# Patient Record
Sex: Female | Born: 1965
Health system: Southern US, Community
[De-identification: ages and names within clinical notes are randomized; demographics above are authoritative.]

## PROBLEM LIST (undated history)

## (undated) DIAGNOSIS — T7840XA Allergy, unspecified, initial encounter: Secondary | ICD-10-CM

## (undated) DIAGNOSIS — N750 Cyst of Bartholin's gland: Secondary | ICD-10-CM

## (undated) DIAGNOSIS — F32A Depression, unspecified: Secondary | ICD-10-CM

## (undated) DIAGNOSIS — F329 Major depressive disorder, single episode, unspecified: Secondary | ICD-10-CM

## (undated) DIAGNOSIS — F419 Anxiety disorder, unspecified: Secondary | ICD-10-CM

## (undated) DIAGNOSIS — E785 Hyperlipidemia, unspecified: Secondary | ICD-10-CM

## (undated) HISTORY — DX: Hyperlipidemia, unspecified: E78.5

## (undated) HISTORY — DX: Anxiety disorder, unspecified: F41.9

## (undated) HISTORY — PX: CATARACT EXTRACTION: SUR2

## (undated) HISTORY — PX: CHOLECYSTECTOMY: SHX55

## (undated) HISTORY — DX: Allergy, unspecified, initial encounter: T78.40XA

---

## 1997-08-06 HISTORY — PX: CHOLECYSTECTOMY: SHX55

## 1997-11-17 ENCOUNTER — Emergency Department (HOSPITAL_COMMUNITY): Admission: EM | Admit: 1997-11-17 | Discharge: 1997-11-17 | Payer: Self-pay | Admitting: Emergency Medicine

## 1998-02-23 ENCOUNTER — Observation Stay (HOSPITAL_COMMUNITY): Admission: RE | Admit: 1998-02-23 | Discharge: 1998-02-24 | Payer: Self-pay | Admitting: General Surgery

## 1998-08-15 ENCOUNTER — Other Ambulatory Visit: Admission: RE | Admit: 1998-08-15 | Discharge: 1998-08-15 | Payer: Self-pay | Admitting: *Deleted

## 1999-08-17 ENCOUNTER — Other Ambulatory Visit: Admission: RE | Admit: 1999-08-17 | Discharge: 1999-08-17 | Payer: Self-pay | Admitting: *Deleted

## 2000-08-19 ENCOUNTER — Other Ambulatory Visit: Admission: RE | Admit: 2000-08-19 | Discharge: 2000-08-19 | Payer: Self-pay | Admitting: *Deleted

## 2001-02-10 ENCOUNTER — Other Ambulatory Visit: Admission: RE | Admit: 2001-02-10 | Discharge: 2001-02-10 | Payer: Self-pay | Admitting: *Deleted

## 2001-09-18 ENCOUNTER — Other Ambulatory Visit: Admission: RE | Admit: 2001-09-18 | Discharge: 2001-09-18 | Payer: Self-pay | Admitting: Obstetrics and Gynecology

## 2002-09-23 ENCOUNTER — Other Ambulatory Visit: Admission: RE | Admit: 2002-09-23 | Discharge: 2002-09-23 | Payer: Self-pay | Admitting: *Deleted

## 2003-10-14 ENCOUNTER — Other Ambulatory Visit: Admission: RE | Admit: 2003-10-14 | Discharge: 2003-10-14 | Payer: Self-pay | Admitting: *Deleted

## 2003-10-19 ENCOUNTER — Encounter: Admission: RE | Admit: 2003-10-19 | Discharge: 2003-10-19 | Payer: Self-pay | Admitting: Internal Medicine

## 2005-01-15 ENCOUNTER — Other Ambulatory Visit: Admission: RE | Admit: 2005-01-15 | Discharge: 2005-01-15 | Payer: Self-pay | Admitting: Obstetrics and Gynecology

## 2006-07-17 ENCOUNTER — Ambulatory Visit (HOSPITAL_COMMUNITY): Admission: RE | Admit: 2006-07-17 | Discharge: 2006-07-17 | Payer: Self-pay | Admitting: Obstetrics and Gynecology

## 2006-08-06 DIAGNOSIS — N750 Cyst of Bartholin's gland: Secondary | ICD-10-CM

## 2006-08-06 HISTORY — DX: Cyst of Bartholin's gland: N75.0

## 2006-11-19 ENCOUNTER — Encounter: Admission: RE | Admit: 2006-11-19 | Discharge: 2006-11-19 | Payer: Self-pay | Admitting: Internal Medicine

## 2007-07-21 ENCOUNTER — Emergency Department (HOSPITAL_COMMUNITY): Admission: EM | Admit: 2007-07-21 | Discharge: 2007-07-21 | Payer: Self-pay | Admitting: Family Medicine

## 2009-05-03 ENCOUNTER — Ambulatory Visit (HOSPITAL_COMMUNITY): Admission: RE | Admit: 2009-05-03 | Discharge: 2009-05-03 | Payer: Self-pay | Admitting: Obstetrics and Gynecology

## 2010-05-04 ENCOUNTER — Ambulatory Visit (HOSPITAL_COMMUNITY): Admission: RE | Admit: 2010-05-04 | Discharge: 2010-05-04 | Payer: Self-pay | Admitting: Obstetrics and Gynecology

## 2010-12-22 NOTE — H&P (Signed)
Brandi Molina, Brandi Molina              ACCOUNT NO.:  [id number]   MEDICAL RECORD NO.:  [id number]          PATIENT TYPE:  AMB   LOCATION:  SDC                           FACILITY:  [facility]   PHYSICIAN:  Guy Sandifer. Henderson Cloud, M.D. DATE OF BIRTH:  13-Aug-1965   DATE OF ADMISSION:  07/17/2006  DATE OF DISCHARGE:                              HISTORY & PHYSICAL   CHIEF COMPLAINT:  Lost IUD.   HISTORY OF PRESENT ILLNESS:  This patient is a 45 year old married white  female G2, P1with a Mirena IUD placed approximately 5 years ago.  The  string has been lost for most of that time.  Ultrasound approximately 3  years ago revealed intrauterine placement of the IUD.  However, it is  now time to have it replaced.  The patient is being admitted for  hysteroscopy D&C to remove the IUD and probable replacement of a Mirena  IUD.  Potential risks and complications have been discussed  preoperatively.   PAST MEDICAL HISTORY:  1. Osteopenia.  2. History of depression.  3. Hyperlipidemia.   PAST SURGICAL HISTORY:  Negative.   MEDICATIONS:  Zoloft and Lipitor.   ALLERGIES:  NO KNOWN DRUG ALLERGIES.   SOCIAL HISTORY:  The patient smokes a pack a day, denies ethanol or drug  abuse.   FAMILY HISTORY:  Noncontributory.   REVIEW OF SYSTEMS:  NEURO:  Denies headache.  CARDIOLOGY:  No chest  pain.  PULMONARY:  Denies shortness of breath.   PHYSICAL EXAMINATION:  VITAL SIGNS:  Height 5 feet 0 inches, weight 115  pounds, blood pressure 98/66.  HEENT:  Without thyromegaly.  LUNGS:  Clear to auscultation.  HEART:  Regular rate and rhythm.  BACK:  Without CVA tenderness.  BREASTS:  Without mass, retraction, or discharge.  ABDOMEN:  Soft, nontender, without masses.  PELVIC EXAM:  Vulva, vagina, cervix without lesion.  There is a 1-2 cm  smooth, mobile, nontender left Bartholin cyst.  Uterus normal size,  mobile, nontender.  IUD string not visible.  Adnexa nontender without  masses.  EXTREMITIES/NEUROLOGICAL:   Exam grossly within normal limits.   ASSESSMENT:  Lost intrauterine device string.   PLAN:  Hysteroscopy D&C, removal and replacement of Mirena IUD.      Guy Sandifer Henderson Cloud, M.D.  Electronically Signed     JET/MEDQ  D:  07/15/2006  T:  07/15/2006  Job:  [id number]

## 2010-12-22 NOTE — Op Note (Signed)
NAMEJAYLANI, MCGUINN              ACCOUNT NO.:  [id number]   MEDICAL RECORD NO.:  [id number]          PATIENT TYPE:  AMB   LOCATION:  SDC                           FACILITY:  [facility]   PHYSICIAN:  Guy Sandifer. Henderson Cloud, M.D. DATE OF BIRTH:  Aug 02, 1966   DATE OF PROCEDURE:  07/17/2006  DATE OF DISCHARGE:                               OPERATIVE REPORT   PREOPERATIVE DIAGNOSIS:  Lost IUD string.   POSTOPERATIVE DIAGNOSIS:  Lost IUD string.   PROCEDURE:  Removal and replacement of Marina IUD and 1% Xylocaine  paracervical block.   SURGEON:  Harold Hedge, MD   ANESTHESIA:  MAC.   SPECIMENS:  None.   BLOOD LOSS:  Minimal.   INDICATIONS AND CONSENT:  This patient is a 45 year old married white  female G2, P1 with Jearld Adjutant IUD in place.  The IUD string has been lost.  Ultrasound reveals it to be intrauterine in location.  It is time for  replacement.  The patient requests the IUD be replaced with another  [facility].  Potential risks and complications have been discussed  preoperatively including but not limited to infection, uterine  perforation, organ damage, bleeding requiring transfusion of blood  products with possible transfusion reaction, HIV and hepatitis  acquisition, DVT and PE.  All questions were answered and consent signed  on the chart.   PROCEDURE:  The patient taken to operating room where she was  identified, placed dorsosupine position and given intravenous sedation.  She is then placed in dorsal lithotomy position where she was prepped,  bladder straight catheterized and she is draped in sterile fashion.  Bivalve speculum was placed in vagina.  Anterior cervical lip was  injected with 1% Xylocaine and grasped with single-tooth tenaculum.  Paracervical block was placed at the 2, 4, 5, 7, 8 and 10 o'clock  positions with approximately 20 mL total of 1% plain Xylocaine.  Cervix  was gently progressively dilated to a 19 dilator.  Randall stone forceps  were then placed in the  intrauterine cavity and the IUD was easily  extracted.  The uterus was sounded and a Jearld Adjutant IUD was then replaced  per manufacturer's instructions without difficulty.  String is trimmed.  All counts were correct.  The patient is awakened and taken to recovery  room in stable condition.      Guy Sandifer Henderson Cloud, M.D.  Electronically Signed     JET/MEDQ  D:  07/17/2006  T:  07/17/2006  Job:  [id number]

## 2011-05-30 ENCOUNTER — Other Ambulatory Visit (HOSPITAL_COMMUNITY): Payer: Self-pay | Admitting: Obstetrics and Gynecology

## 2011-05-30 DIAGNOSIS — Z1231 Encounter for screening mammogram for malignant neoplasm of breast: Secondary | ICD-10-CM

## 2011-06-22 ENCOUNTER — Ambulatory Visit (HOSPITAL_COMMUNITY): Payer: Self-pay

## 2011-06-25 ENCOUNTER — Ambulatory Visit (HOSPITAL_COMMUNITY)
Admission: RE | Admit: 2011-06-25 | Discharge: 2011-06-25 | Disposition: A | Discharge status: Home / Self Care | Payer: Self-pay | Source: Ambulatory Visit | Attending: Obstetrics and Gynecology | Admitting: Obstetrics and Gynecology

## 2011-06-25 DIAGNOSIS — Z1231 Encounter for screening mammogram for malignant neoplasm of breast: Secondary | ICD-10-CM

## 2011-09-19 ENCOUNTER — Encounter (HOSPITAL_COMMUNITY): Payer: Self-pay | Admitting: *Deleted

## 2011-09-19 ENCOUNTER — Emergency Department (HOSPITAL_COMMUNITY)
Admission: EM | Admit: 2011-09-19 | Discharge: 2011-09-19 | Disposition: A | Discharge status: Home / Self Care | Payer: Self-pay | Source: Home / Self Care | Attending: Family Medicine | Admitting: Family Medicine

## 2011-09-19 DIAGNOSIS — N750 Cyst of Bartholin's gland: Secondary | ICD-10-CM

## 2011-09-19 MED ORDER — OXYCODONE-ACETAMINOPHEN 5-325 MG PO TABS: ORAL_TABLET | ORAL | Status: AC

## 2011-09-19 MED ORDER — SULFAMETHOXAZOLE-TRIMETHOPRIM 800-160 MG PO TABS: 1.0000 | ORAL_TABLET | Freq: Two times a day (BID) | ORAL | Status: AC

## 2011-09-19 NOTE — ED Notes (Signed)
pT  REPORTS  HISTORY  OF A  RECURRENT  CYST/ BOIL  IN  PERINEAL  AREA      -  SHE  STATES  Dr  Rudene Christians  Was   Going to  Remove  It in past  But  For  Some  Reason it did  Not happen   -  At  This time  Pt reports  Pain/ swelling  Swelling

## 2011-09-19 NOTE — Discharge Instructions (Signed)
This is a Bartholin's cyst vs. Abscess. It is inflamed and tender. It will need to be drained. Please present to [facility] for further evaluation and treatment. Please take medications as directed. You should also use warm compresses and Sitz baths for comfort. Return to care should your symptoms not improve, or worsen in any way.

## 2011-09-19 NOTE — ED Provider Notes (Signed)
History     CSN: [id number]  Arrival date & time 09/19/11  2536   First MD Initiated Contact with Patient 09/19/11 1909      Chief Complaint  Patient presents with  . Recurrent Skin Infections    (Consider location/radiation/quality/duration/timing/severity/associated sxs/prior treatment) HPI Comments: Brandi Molina presents for evaluation of a painful lesion on the left side of her vagina. She reports that this has been present for many years. He was previously evaluated by her previous gynecologist. He told her this would probably need to be removed at some point. However it has not bothered her until now. She reports painful swelling in the area. States that she had an IUD removed in December and has been having sexual intercourse with her husband, who has been using condoms. She wonders if the condoms or irritating the area now.  Patient is a 46 y.o. female presenting with female genitourinary complaint. The history is provided by the patient.  Female GU Problem Primary symptoms include genital lesions.  Primary symptoms include no discharge and no vaginal bleeding. There has been no fever. This is a new problem. The current episode started more than 1 week ago. The problem occurs constantly. The problem has not changed since onset.The symptoms occur at rest and during intercourse.    History reviewed. No pertinent past medical history.  Past Surgical History  Procedure Date  . Cholecystectomy     History reviewed. No pertinent family history.  History  Substance Use Topics  . Smoking status: Current Everyday Smoker  . Smokeless tobacco: Not on file  . Alcohol Use: No    OB History    Grav Para Term Preterm Abortions TAB SAB Ect Mult Living                  Review of Systems  Constitutional: Negative.   HENT: Negative.   Eyes: Negative.   Respiratory: Negative.   Cardiovascular: Negative.   Gastrointestinal: Negative.   Genitourinary: Positive for vaginal pain.  Negative for vaginal bleeding.  Musculoskeletal: Negative.   Skin: Negative.   Neurological: Negative.     Allergies  Review of patient's allergies indicates no known allergies.  Home Medications   Current Outpatient Rx  Name Route Sig Dispense Refill  . SERTRALINE HCL 50 MG PO TABS Oral Take 50 mg by mouth daily.    . OXYCODONE-ACETAMINOPHEN 5-325 MG PO TABS  Take one to two tablets every 4 to 6 hours as needed for pain 20 tablet 0  . SULFAMETHOXAZOLE-TRIMETHOPRIM 800-160 MG PO TABS Oral Take 1 tablet by mouth 2 (two) times daily. 14 tablet 0    BP 137/68  Pulse 90  Temp(Src) 98.2 F (36.8 C) (Oral)  Resp 18  SpO2 100%  LMP 09/12/2011  Physical Exam  Nursing note and vitals reviewed. Constitutional: She is oriented to person, place, and time. She appears well-developed and well-nourished.  HENT:  Head: Normocephalic and atraumatic.  Eyes: EOM are normal.  Neck: Normal range of motion.  Pulmonary/Chest: Effort normal.  Genitourinary: Vagina normal.    There is tenderness on the left labia.  Musculoskeletal: Normal range of motion.  Neurological: She is alert and oriented to person, place, and time.  Skin: Skin is warm and dry.  Psychiatric: Her behavior is normal.    ED Course  Procedures (including critical care time)  Labs Reviewed - No data to display No results found.   1. Bartholin's cyst       MDM  rx given for  hydrocodone PRN, TMP-SMX, and referred to [facility] tomorrow for follow up.         Richardo Priest, MD 09/19/11 2008

## 2011-09-22 ENCOUNTER — Inpatient Hospital Stay (HOSPITAL_COMMUNITY)
Admission: AD | Admit: 2011-09-22 | Discharge: 2011-09-22 | Disposition: A | Discharge status: Home / Self Care | Payer: Self-pay | Source: Ambulatory Visit | Attending: Obstetrics & Gynecology | Admitting: Obstetrics & Gynecology

## 2011-09-22 ENCOUNTER — Encounter (HOSPITAL_COMMUNITY): Payer: Self-pay | Admitting: *Deleted

## 2011-09-22 DIAGNOSIS — N750 Cyst of Bartholin's gland: Secondary | ICD-10-CM | POA: Insufficient documentation

## 2011-09-22 HISTORY — DX: Major depressive disorder, single episode, unspecified: F32.9

## 2011-09-22 HISTORY — DX: Cyst of Bartholin's gland: N75.0

## 2011-09-22 HISTORY — DX: Depression, unspecified: F32.A

## 2011-09-22 MED ORDER — LIDOCAINE 5 % EX OINT: TOPICAL_OINTMENT | Freq: Three times a day (TID) | CUTANEOUS | Status: AC | PRN

## 2011-09-22 MED ORDER — LIDOCAINE 5 % EX OINT
TOPICAL_OINTMENT | Freq: Three times a day (TID) | CUTANEOUS | Status: DC | PRN
  Administered 2011-09-22: 02:00:00 via TOPICAL
  Filled 2011-09-22: qty 35.44

## 2011-09-22 NOTE — ED Provider Notes (Signed)
Chief Complaint:  Cyst   Brandi Molina is  46 y.o. No obstetric history on file..  Patient's last menstrual period was 09/12/2011..  She presents complaining of Cyst  Reports diagnosis of bartholin cyst at [facility] on 09/19/11. Was rx'd septra and percocet. States only taking Ibuprofen. Using warm compresses daily. States she was instructed to return to [facility]U today for I&D.   Obstetrical/Gynecological History: OB History    Grav Para Term Preterm Abortions TAB SAB Ect Mult Living                  Past Medical History: No past medical history on file.  Past Surgical History: Past Surgical History  Procedure Date  . Cholecystectomy     Family History: No family history on file.  Social History: History  Substance Use Topics  . Smoking status: Current Everyday Smoker  . Smokeless tobacco: Not on file  . Alcohol Use: No    Allergies: No Known Allergies  Prescriptions prior to admission  Medication Sig Dispense Refill  . oxyCODONE-acetaminophen (PERCOCET) 5-325 MG per tablet Take one to two tablets every 4 to 6 hours as needed for pain  20 tablet  0  . sertraline (ZOLOFT) 50 MG tablet Take 50 mg by mouth daily.      Marland Kitchen sulfamethoxazole-trimethoprim (BACTRIM DS,SEPTRA DS) 800-160 MG per tablet Take 1 tablet by mouth 2 (two) times daily.  14 tablet  0    Review of Systems - Negative except what has been reviewed in HPI  Physical Exam   Blood pressure 128/57, pulse 99, temperature 98.3 F (36.8 C), temperature source Oral, resp. rate 20, height 5' (1.524 m), weight 128 lb 8 oz (58.287 kg), last menstrual period 09/12/2011.  General: General appearance - alert, well appearing, and in no distress, oriented to person, place, and time and normal appearing weight Mental status - alert, oriented to person, place, and time, normal mood, behavior, speech, dress, motor activity, and thought processes, affect appropriate to mood Focused Gynecological Exam: VULVA: vulvar mass left labia  with firm, tender, non fluccuant bartholin cyst, VAGINA: vaginal cyst as above. Not ready for I&D   Assessment: Bartholin Cyst: left  Plan: Discharge home Warm water soaks/compresses 3-4 times daily 5% Lidocaine ointment prn pain.  Return to [facility]U or PCP in 3 days for recheck, readiness for I&D  SHORES,SUZANNE E. 09/22/2011,1:34 AM   Attestation of Attending Supervision of Advanced Practitioner (CNM/NP): Evaluation and management procedures were performed by the Advanced Practitioner under my supervision and collaboration. I have reviewed the Advanced Practitioner's note and chart, and I agree with the management and plan.  Yerik Zeringue H. 11:01 AM

## 2011-09-22 NOTE — Discharge Instructions (Signed)
Bartholin's Cyst and Abscess Bartholin's glands produce mucus through small openings just outside the opening of the vagina. The mucus helps with lubrication around the vagina during sexual intercourse. If the duct becomes clogged, the gland will swell and cause a bulge on the inside of the vagina. If this becomes big enough, it can be seen and felt on the outside of the vagina as well. Sometimes, the swelling will shrink away by itself. However, if the cyst becomes infected, the Bartholin's cyst fills with pus and becomes more swollen, red and painful and becomes a Bartholin's abscess. This usually requires antibiotic treatment and surgical drainage. Sometimes, with minor surgery under local anesthesia, a small tube is placed in the cyst or abscess wall. This allows continued drainage for up to 6 weeks. Minor surgery can make a new opening to replace the clogged duct and help prevent future cysts or abscess. If the abscess occurs several times, a minor operation with local anesthesia is necessary to remove the Bartholin's gland completely or to make it drain better. Cutting open the gland and suturing the edges to make the opening of the gland bigger (marsupialization) may be needed and should usually be done by your obstetrician-gyncology physician. Antibiotics are usually prescribed for this condition. Take all antibiotics as prescribed. Make sure to finish them even if you are doing better. Take warm sitz baths for 20 minutes, 3 times a day. See your caregiver for follow-up care as recommended. SEEK MEDICAL CARE IF:   You have increasing pain, swelling, or redness near the vagina.   You have vomiting or inability to tolerate medicines.   You have a fever.   You have uncontrolled bleeding from the vagina.  Document Released: 08/30/2004 Document Revised: 04/04/2011 Document Reviewed: 09/02/2009 Exit[employer] Patient Information 2012 [employer]. 

## 2011-09-22 NOTE — Progress Notes (Signed)
Pt states, " I was seen at [facility] on Feb 13, and they started me on Septra and percocet. Dr. Marvetta Gibbons office couldn't see me and it is huge and side of my vagina."

## 2011-09-22 NOTE — ED Notes (Signed)
[name] CNM at bedside  

## 2011-09-24 ENCOUNTER — Encounter: Payer: Self-pay | Admitting: Physician Assistant

## 2011-09-24 ENCOUNTER — Ambulatory Visit (INDEPENDENT_AMBULATORY_CARE_PROVIDER_SITE_OTHER): Payer: Self-pay | Admitting: Physician Assistant

## 2011-09-24 VITALS — BP 119/65 | HR 114 | Temp 97.0°F | Ht 60.0 in | Wt 125.7 lb

## 2011-09-24 DIAGNOSIS — N750 Cyst of Bartholin's gland: Secondary | ICD-10-CM | POA: Insufficient documentation

## 2011-09-24 DIAGNOSIS — N751 Abscess of Bartholin's gland: Secondary | ICD-10-CM

## 2011-09-24 NOTE — Patient Instructions (Signed)
Bartholin's Cyst and Abscess Bartholin's glands produce mucus through small openings just outside the opening of the vagina. The mucus helps with lubrication around the vagina during sexual intercourse. If the duct becomes clogged, the gland will swell and cause a bulge on the inside of the vagina. If this becomes big enough, it can be seen and felt on the outside of the vagina as well. Sometimes, the swelling will shrink away by itself. However, if the cyst becomes infected, the Bartholin's cyst fills with pus and becomes more swollen, red and painful and becomes a Bartholin's abscess. This usually requires antibiotic treatment and surgical drainage. Sometimes, with minor surgery under local anesthesia, a small tube is placed in the cyst or abscess wall. This allows continued drainage for up to 6 weeks. Minor surgery can make a new opening to replace the clogged duct and help prevent future cysts or abscess. If the abscess occurs several times, a minor operation with local anesthesia is necessary to remove the Bartholin's gland completely or to make it drain better. Cutting open the gland and suturing the edges to make the opening of the gland bigger (marsupialization) may be needed and should usually be done by your obstetrician-gyncology physician. Antibiotics are usually prescribed for this condition. Take all antibiotics as prescribed. Make sure to finish them even if you are doing better. Take warm sitz baths for 20 minutes, 3 times a day. See your caregiver for follow-up care as recommended. SEEK MEDICAL CARE IF:   You have increasing pain, swelling, or redness near the vagina.   You have vomiting or inability to tolerate medicines.   You have a fever.   You have uncontrolled bleeding from the vagina.  Document Released: 08/30/2004 Document Revised: 04/04/2011 Document Reviewed: 09/02/2009 Exit[employer] Patient Information 2012 [employer]. 

## 2011-09-24 NOTE — Progress Notes (Signed)
Chief Complaint:  bartholen cyst   Brandi Molina is  46 y.o. G2P1011.  Patient's last menstrual period was 09/12/2011.Marland Kitchen  Her pregnancy status is negative.  She presents complaining of bartholen cyst  Pt presents for evaluation of Bartholin Cyst for I&D. Was seen in [facility]U early Saturday morning and was not ready for I&D. Has been taking Septra and doing baking soda soaks TID for comforting.  Obstetrical/Gynecological History: OB History    Grav Para Term Preterm Abortions TAB SAB Ect Mult Living   2 1 1  1 1    1       Past Medical History: Past Medical History  Diagnosis Date  . Bartholin gland cyst 2008  . Depression     Past Surgical History: Past Surgical History  Procedure Date  . Cholecystectomy   . Cholecystectomy 1999    Family History: Family History  Problem Relation Age of Onset  . Anesthesia problems Neg Hx   . Hypotension Neg Hx   . Malignant hyperthermia Neg Hx   . Pseudochol deficiency Neg Hx     Social History: History  Substance Use Topics  . Smoking status: Current Everyday Smoker -- 1.0 packs/day  . Smokeless tobacco: Not on file  . Alcohol Use: No    Allergies: No Known Allergies   (Not in a hospital admission)  Review of Systems - Negative except what has been reviewed in HPI  Physical Exam   Blood pressure 119/65, pulse 114, temperature 97 F (36.1 C), temperature source Oral, height 5' (1.524 m), weight 125 lb 11.2 oz (57.017 kg), last menstrual period 09/12/2011.  General: General appearance - alert, uncomfortable appearing, and in no distress, oriented to person, place, and time, normal appearing weight Mental status - alert, oriented to person, place, and time, normal mood, behavior, speech, dress, motor activity, and thought processes, affect appropriate to mood Focused Gynecological Exam: Large left sided bartholin abscess, golf ball sized, soft, tender  After informed consent and timeout area was cleansed with betadine and  infused with 2cc of 1% lidocaine. After adequate level achieved, 2mm incision made. Copious amount of purulent drainage expressed. Additional 2cc Lidocaine infused at incision site and marsupialization was perform in normal fashion with 2-0 vicryl. Pt tolerated procedure well.   Assessment: I&D of Bartholin Gland Abscess with Marsupialization  Plan: Continue soaks daily for drainage and comfort FU 4 weeks for recheck or prn probs  Brandi Molina. 09/24/2011,3:27 PM

## 2011-10-22 ENCOUNTER — Ambulatory Visit: Payer: Self-pay | Admitting: Physician Assistant

## 2011-11-09 ENCOUNTER — Ambulatory Visit: Payer: Self-pay | Admitting: Physician Assistant

## 2012-06-17 ENCOUNTER — Other Ambulatory Visit (HOSPITAL_COMMUNITY): Payer: Self-pay | Admitting: Obstetrics and Gynecology

## 2012-06-17 DIAGNOSIS — Z1231 Encounter for screening mammogram for malignant neoplasm of breast: Secondary | ICD-10-CM

## 2012-07-04 ENCOUNTER — Ambulatory Visit (HOSPITAL_COMMUNITY): Payer: Self-pay | Attending: Obstetrics and Gynecology

## 2013-10-02 ENCOUNTER — Emergency Department (INDEPENDENT_AMBULATORY_CARE_PROVIDER_SITE_OTHER): Payer: Self-pay

## 2013-10-02 ENCOUNTER — Encounter (HOSPITAL_COMMUNITY): Payer: Self-pay | Admitting: Emergency Medicine

## 2013-10-02 ENCOUNTER — Emergency Department (INDEPENDENT_AMBULATORY_CARE_PROVIDER_SITE_OTHER)
Admission: EM | Admit: 2013-10-02 | Discharge: 2013-10-02 | Disposition: A | Discharge status: Home / Self Care | Payer: Self-pay | Source: Home / Self Care | Attending: Family Medicine | Admitting: Family Medicine

## 2013-10-02 DIAGNOSIS — M545 Low back pain, unspecified: Secondary | ICD-10-CM

## 2013-10-02 DIAGNOSIS — W009XXA Unspecified fall due to ice and snow, initial encounter: Secondary | ICD-10-CM

## 2013-10-02 DIAGNOSIS — W010XXA Fall on same level from slipping, tripping and stumbling without subsequent striking against object, initial encounter: Secondary | ICD-10-CM

## 2013-10-02 MED ORDER — KETOROLAC TROMETHAMINE 60 MG/2ML IM SOLN
INTRAMUSCULAR | Status: AC
  Filled 2013-10-02: qty 2

## 2013-10-02 MED ORDER — HYDROCODONE-ACETAMINOPHEN 5-325 MG PO TABS: 1.0000 | ORAL_TABLET | Freq: Four times a day (QID) | ORAL | Status: DC | PRN

## 2013-10-02 MED ORDER — KETOROLAC TROMETHAMINE 60 MG/2ML IM SOLN
60.0000 mg | Freq: Once | INTRAMUSCULAR | Status: AC
  Administered 2013-10-02: 60 mg via INTRAMUSCULAR

## 2013-10-02 NOTE — ED Provider Notes (Signed)
Brandi Molina is a 48 y.o. female who presents to Urgent Care today for follow a low back pain. Patient slipped and fell landing on her left low back today. She landed on the ears. She notes severe left lateral low back pain without any radiation. She notes pain with leg motion. She denies any fevers or chills nausea vomiting or diarrhea. She denies any weakness or numbness or bowel bladder dysfunction.   Past Medical History  Diagnosis Date  . Bartholin gland cyst 2008  . Depression    History  Substance Use Topics  . Smoking status: Current Every Day Smoker -- 1.00 packs/day  . Smokeless tobacco: Not on file  . Alcohol Use: No   ROS as above Medications: No current facility-administered medications for this encounter.   Current Outpatient Prescriptions  Medication Sig Dispense Refill  . HYDROcodone-acetaminophen (NORCO/VICODIN) 5-325 MG per tablet Take 1 tablet by mouth every 6 (six) hours as needed.  15 tablet  0  . sertraline (ZOLOFT) 50 MG tablet Take 50 mg by mouth daily.        Exam:  BP 128/56  Pulse 74  Temp(Src) 97.5 F (36.4 C) (Oral)  Resp 14  SpO2 100%  LMP 09/20/2013 Gen: Well NAD Back: Tender and ecchymosis around the left SI joint. Mildly tender low lumbar spinal midline.  Range of motion significantly limited due to pain Leg strength intact.  Capillary refill and sensation are intact distally   No results found for this or any previous visit (from the past 24 hour(s)). Dg Lumbar Spine Complete  10/02/2013   CLINICAL DATA:  48 year old female status post fall down stairs with pain. Initial encounter.  EXAM: LUMBAR SPINE - COMPLETE 4+ VIEW  COMPARISON:  Pelvis radiograph from the same day reported separately.  FINDINGS: Standing views. Aortoiliac calcified atherosclerosis noted. Right upper quadrant surgical clips.  Normal lumbar segmentation. No pars fracture. Moderate lower lumbar facet hypertrophy. Mild grade 1 anterolisthesis of L4 on L5 (3 mm). Mild  retrolisthesis of L3 on L4. Relatively preserved disc spaces. No lumbar compression fracture identified. Sacral ala and SI joints grossly intact.  IMPRESSION: 1.  No acute fracture identified in the lumbar spine. 2. Mild degenerative appearing grade 1 spondylolisthesis at L4-L5 and L3-L4, associated with lower lumbar facet arthropathy. 3.  Aortoiliac calcified atherosclerosis.   Electronically Signed   By: Lars Pinks M.D.   On: 10/02/2013 10:04   Dg Pelvis 1-2 Views  10/02/2013   CLINICAL DATA:  Lower lumbar and pelvic pain secondary to a fall down stairs this morning.  EXAM: PELVIS - 1-2 VIEW  COMPARISON:  None.  FINDINGS: There is no evidence of pelvic fracture or diastasis or other abnormality.  IMPRESSION: Normal exam.   Electronically Signed   By: Rozetta Nunnery M.D.   On: 10/02/2013 09:47    Assessment and Plan: 48 y.o. female with pain due to fall and low back contusion. Plan to treat with IM Toradol followed by oral NSAIDs and Norco. Followup if not improving   Discussed warning signs or symptoms. Please see discharge instructions. Patient expresses understanding.    Gregor Hams, MD 10/02/13 1018

## 2013-10-02 NOTE — Discharge Instructions (Signed)
Thank you for coming in today. Take up to 2 Aleve twice daily starting this afternoon.  Use Norco for severe pain Come back or go to the emergency room if you notice new weakness new numbness problems walking or bowel or bladder problems.

## 2014-04-27 ENCOUNTER — Other Ambulatory Visit: Payer: Self-pay

## 2014-04-27 DIAGNOSIS — Z1231 Encounter for screening mammogram for malignant neoplasm of breast: Secondary | ICD-10-CM

## 2014-05-07 ENCOUNTER — Ambulatory Visit
Admission: RE | Admit: 2014-05-07 | Discharge: 2014-05-07 | Disposition: A | Discharge status: Home / Self Care | Payer: BC Managed Care – PPO | Source: Ambulatory Visit

## 2014-05-07 DIAGNOSIS — Z1231 Encounter for screening mammogram for malignant neoplasm of breast: Secondary | ICD-10-CM

## 2014-06-07 ENCOUNTER — Encounter (HOSPITAL_COMMUNITY): Payer: Self-pay | Admitting: Emergency Medicine

## 2014-07-12 ENCOUNTER — Other Ambulatory Visit: Payer: Self-pay | Admitting: Nurse Practitioner

## 2014-07-12 ENCOUNTER — Ambulatory Visit
Admission: RE | Admit: 2014-07-12 | Discharge: 2014-07-12 | Disposition: A | Discharge status: Home / Self Care | Payer: BC Managed Care – PPO | Source: Ambulatory Visit | Attending: Nurse Practitioner | Admitting: Nurse Practitioner

## 2014-07-12 DIAGNOSIS — M549 Dorsalgia, unspecified: Secondary | ICD-10-CM

## 2014-07-28 ENCOUNTER — Other Ambulatory Visit: Payer: Self-pay | Admitting: *Deleted

## 2014-07-28 DIAGNOSIS — I714 Abdominal aortic aneurysm, without rupture, unspecified: Secondary | ICD-10-CM

## 2014-08-11 ENCOUNTER — Emergency Department (HOSPITAL_COMMUNITY)
Admission: EM | Admit: 2014-08-11 | Discharge: 2014-08-12 | Disposition: A | Discharge status: Home / Self Care | Payer: BLUE CROSS/BLUE SHIELD | Attending: Emergency Medicine | Admitting: Emergency Medicine

## 2014-08-11 ENCOUNTER — Encounter (HOSPITAL_COMMUNITY): Payer: Self-pay | Admitting: Emergency Medicine

## 2014-08-11 DIAGNOSIS — Z8742 Personal history of other diseases of the female genital tract: Secondary | ICD-10-CM | POA: Insufficient documentation

## 2014-08-11 DIAGNOSIS — Z720 Tobacco use: Secondary | ICD-10-CM | POA: Diagnosis not present

## 2014-08-11 DIAGNOSIS — F329 Major depressive disorder, single episode, unspecified: Secondary | ICD-10-CM | POA: Insufficient documentation

## 2014-08-11 DIAGNOSIS — M549 Dorsalgia, unspecified: Secondary | ICD-10-CM | POA: Diagnosis not present

## 2014-08-11 DIAGNOSIS — Z79899 Other long term (current) drug therapy: Secondary | ICD-10-CM | POA: Diagnosis not present

## 2014-08-11 DIAGNOSIS — M79604 Pain in right leg: Secondary | ICD-10-CM | POA: Diagnosis not present

## 2014-08-11 DIAGNOSIS — M25561 Pain in right knee: Secondary | ICD-10-CM | POA: Diagnosis present

## 2014-08-11 NOTE — ED Notes (Signed)
The patient said her family is susceptible to abdominal aneurysms and she is worried because she has had pain in his right leg from her groin down her right leg.

## 2014-08-11 NOTE — ED Provider Notes (Signed)
CSN: [id number]     Arrival date & time 08/11/14  2300 History  This chart was scribed for non-physician practitioner working with Delora Fuel, MD by Mercy Moore, ED Scribe. This patient was seen in room TR06C/TR06C and the patient's care was started at 11:40 PM.   Chief Complaint  Patient presents with  . Back Pain  . Knee Pain   HPI Comments: Brandi Molina is a 49 y.o. female who presents to the Emergency Department complaining of right medial knee pain that awoke her early this morning at 4am. Patient describes burning, aching pain with radiation into her lower leg and up into her groin. Patient reports constant pain that remained at the same intensity level throughout the day. Patient reports treatment with naproxen given to her for her back pain last week. Patient denies loss of sensation in her right leg, but reports that her right foot feels cold. Patient reports pain in her inner thigh with walking; she denies pain in her calf. Patient reports family history of abdominal aortic aneurysm in her mother (at age ~66) and maternal uncle. Patient attributes her current leg pain to possibility of AAA. Patient reports recent back pain onset 1 month ago. This back pain has persisted since this time; she reports mild back pain at present without changes in symptomatology from onset. Patient states that she feels "something is about to burst." Patient denies fever, leg swelling, abdominal pain, nausea, vomiting. Patient denies history of cancer or IV drug use. Patient denies injury or trauma.    The history is provided by the patient. No language interpreter was used.    Past Medical History  Diagnosis Date  . Bartholin gland cyst 2008  . Depression    Past Surgical History  Procedure Laterality Date  . Cholecystectomy    . Cholecystectomy  1999   Family History  Problem Relation Age of Onset  . Anesthesia problems Neg Hx   . Hypotension Neg Hx   . Malignant hyperthermia Neg Hx   .  Pseudochol deficiency Neg Hx    History  Substance Use Topics  . Smoking status: Current Every Day Smoker -- 1.00 packs/day  . Smokeless tobacco: Not on file  . Alcohol Use: No   OB History    Gravida Para Term Preterm AB TAB SAB Ectopic Multiple Living   2 1 1  1 1    1       Review of Systems  Constitutional: Negative for fever and chills.  Cardiovascular: Negative for leg swelling.  Gastrointestinal: Negative for nausea, vomiting and abdominal pain.  Genitourinary: Negative for pelvic pain.  Musculoskeletal: Positive for back pain (unchanged since 1st week December) and arthralgias. Negative for joint swelling.  Neurological: Negative for syncope and weakness.  All other systems reviewed and are negative.   Allergies  Review of patient's allergies indicates no known allergies.  Home Medications   Prior to Admission medications   Medication Sig Start Date End Date Taking? Authorizing Provider  HYDROcodone-acetaminophen (NORCO/VICODIN) 5-325 MG per tablet Take 1 tablet by mouth every 6 (six) hours as needed. 10/02/13   Gregor Hams, MD  sertraline (ZOLOFT) 50 MG tablet Take 50 mg by mouth daily.    Historical Provider, MD  traMADol (ULTRAM) 50 MG tablet Take 1 tablet (50 mg total) by mouth every 6 (six) hours as needed for severe pain. 08/12/14   Antonietta Breach, PA-C   Triage Vitals: BP 137/69 mmHg  Pulse 98  Temp(Src) 97.4 F (36.3 C) (  Oral)  Resp 16  Ht 5' (1.524 m)  Wt 120 lb (54.432 kg)  BMI 23.44 kg/m2  SpO2 100%  Physical Exam  Constitutional: She is oriented to person, place, and time. She appears well-developed and well-nourished. No distress.  Nontoxic/nonseptic appearing.  HENT:  Head: Normocephalic and atraumatic.  Eyes: Conjunctivae and EOM are normal. No scleral icterus.  Neck: Normal range of motion.  Cardiovascular: Normal rate, regular rhythm and intact distal pulses.   DP and PT pulses 2+ b/l  Pulmonary/Chest: Effort normal. No respiratory distress.   Respirations even and unlabored.  Abdominal:  Abdomen soft and nontender without masses. AA palpated to approximately 4cm. No peritoneal signs.  Musculoskeletal: Normal range of motion.  No swelling noted to RLE. No palpable cords. No pitting edema. Normal ROM of RLE.  Neurological: She is alert and oriented to person, place, and time. She exhibits normal muscle tone. Coordination normal.  Sensation to light touch intact. Patient ambulatory with normal gait. Patellar and achilles reflexes 2+  Skin: Skin is warm and dry. No rash noted. She is not diaphoretic. No erythema. No pallor.  Skin of RLE warm to touch. No pallor.  Psychiatric: She has a normal mood and affect. Her behavior is normal.  Nursing note and vitals reviewed.   ED Course  Procedures (including critical care time)  COORDINATION OF CARE: 1:45 AM- Discussed treatment plan with patient at bedside and patient agreed to plan.   Labs Review Labs Reviewed  D-DIMER, QUANTITATIVE    Imaging Review No results found.   EKG Interpretation None      MDM   Final diagnoses:  Right leg pain    49 year old female presents to the emergency department for pain in her right leg which woke her from sleep yesterday morning. Patient is neurovascularly intact. No red flags or signs concerning for cauda equina. No palpable cords. No clinical signs of DVT. Patient has a negative d-dimer which rules out possibility of DVT/PE. Patient also concerned about AAA as she has family history of this. Abdominal ultrasound performed at bedside with normal appearing caliber to the abdominal aorta. Suspect that symptoms today are musculoskeletal vs sciatica from hx of low back pain. Will treat as outpatient with tramadol for severe pain. Have advised primary care follow-up. Return precautions discussed and provided. Patient agreeable to plan with no unaddressed concerns.  I personally performed the services described in this documentation, which  was scribed in my presence. The recorded information has been reviewed and is accurate.   Filed Vitals:   08/11/14 2303  BP: 137/69  Pulse: 98  Temp: 97.4 F (36.3 C)  TempSrc: Oral  Resp: 16  Height: 5' (1.524 m)  Weight: 120 lb (54.432 kg)  SpO2: 100%     Antonietta Breach, PA-C 48/88/91 6945  Delora Fuel, MD 03/88/82 8003

## 2014-08-11 NOTE — ED Notes (Signed)
Pt. reports mid back pain onset last week , denies recent fall or injury , pt. also stated right knee pain radiating to right groin , ambulatory / respirations unlabored .

## 2014-08-12 LAB — D-DIMER, QUANTITATIVE: D-Dimer, Quant: 0.27 ug/mL-FEU (ref 0.00–0.48)

## 2014-08-12 MED ORDER — TRAMADOL HCL 50 MG PO TABS: 50.0000 mg | ORAL_TABLET | Freq: Four times a day (QID) | ORAL | Status: DC | PRN

## 2014-08-12 NOTE — Discharge Instructions (Signed)
Muscle Pain  Muscle pain (myalgia) may be caused by many things, including:   Overuse or muscle strain, especially if you are not in shape. This is the most common cause of muscle pain.   Injury.   Bruises.   Viruses, such as the flu.   Infectious diseases.   Fibromyalgia, which is a chronic condition that causes muscle tenderness, fatigue, and headache.   Autoimmune diseases, including lupus.   Certain drugs, including ACE inhibitors and statins.  Muscle pain may be mild or severe. In most cases, the pain lasts only a short time and goes away without treatment. To diagnose the cause of your muscle pain, your health care provider will take your medical history. This means he or she will ask you when your muscle pain began and what has been happening. If you have not had muscle pain for very long, your health care provider may want to wait before doing much testing. If your muscle pain has lasted a long time, your health care provider may want to run tests right away. If your health care provider thinks your muscle pain may be caused by illness, you may need to have additional tests to rule out certain conditions.   Treatment for muscle pain depends on the cause. Home care is often enough to relieve muscle pain. Your health care provider may also prescribe anti-inflammatory medicine.  HOME CARE INSTRUCTIONS  Watch your condition for any changes. The following actions may help to lessen any discomfort you are feeling:   Only take over-the-counter or prescription medicines as directed by your health care provider.   Apply ice to the sore muscle:   Put ice in a plastic bag.   Place a towel between your skin and the bag.   Leave the ice on for 15-20 minutes, 3-4 times a day.   You may alternate applying hot and cold packs to the muscle as directed by your health care provider.   If overuse is causing your muscle pain, slow down your activities until the pain goes away.   Remember that it is normal to feel  some muscle pain after starting a workout program. Muscles that have not been used often will be sore at first.   Do regular, gentle exercises if you are not usually active.   Warm up before exercising to lower your risk of muscle pain.   Do not continue working out if the pain is very bad. Bad pain could mean you have injured a muscle.  SEEK MEDICAL CARE IF:   Your muscle pain gets worse, and medicines do not help.   You have muscle pain that lasts longer than 3 days.   You have a rash or fever along with muscle pain.   You have muscle pain after a tick bite.   You have muscle pain while working out, even though you are in good physical condition.   You have redness, soreness, or swelling along with muscle pain.   You have muscle pain after starting a new medicine or changing the dose of a medicine.  SEEK IMMEDIATE MEDICAL CARE IF:   You have trouble breathing.   You have trouble swallowing.   You have muscle pain along with a stiff neck, fever, and vomiting.   You have severe muscle weakness or cannot move part of your body.  MAKE SURE YOU:    Understand these instructions.   Will watch your condition.   Will get help right away if you are not   questions you have with your health care provider. Back Pain, Adult Low back pain is very common. About 1 in 5 people have back pain.The cause of low back pain is rarely dangerous. The pain often gets better over time.About half of people with a sudden onset of back pain feel better in just 2 weeks. About 8 in 10 people feel better by 6 weeks.  CAUSES Some common causes of back pain include:  Strain of the muscles or ligaments supporting the spine.  Wear and tear  (degeneration) of the spinal discs.  Arthritis.  Direct injury to the back. DIAGNOSIS Most of the time, the direct cause of low back pain is not known.However, back pain can be treated effectively even when the exact cause of the pain is unknown.Answering your caregiver's questions about your overall health and symptoms is one of the most accurate ways to make sure the cause of your pain is not dangerous. If your caregiver needs more information, he or she may order lab work or imaging tests (X-rays or MRIs).However, even if imaging tests show changes in your back, this usually does not require surgery. HOME CARE INSTRUCTIONS For many people, back pain returns.Since low back pain is rarely dangerous, it is often a condition that people can learn to [facility] their own.   Remain active. It is stressful on the back to sit or stand in one place. Do not sit, drive, or stand in one place for more than 30 minutes at a time. Take short walks on level surfaces as soon as pain allows.Try to increase the length of time you walk each day.  Do not stay in bed.Resting more than 1 or 2 days can delay your recovery.  Do not avoid exercise or work.Your body is made to move.It is not dangerous to be active, even though your back may hurt.Your back will likely heal faster if you return to being active before your pain is gone.  Pay attention to your body when you bend and lift. Many people have less discomfortwhen lifting if they bend their knees, keep the load close to their bodies,and avoid twisting. Often, the most comfortable positions are those that put less stress on your recovering back.  Find a comfortable position to sleep. Use a firm mattress and lie on your side with your knees slightly bent. If you lie on your back, put a pillow under your knees.  Only take over-the-counter or prescription medicines as directed by your caregiver. Over-the-counter medicines to reduce pain and inflammation  are often the most helpful.Your caregiver may prescribe muscle relaxant drugs.These medicines help dull your pain so you can more quickly return to your normal activities and healthy exercise.  Put ice on the injured area.  Put ice in a plastic bag.  Place a towel between your skin and the bag.  Leave the ice on for 15-20 minutes, 03-04 times a day for the first 2 to 3 days. After that, ice and heat may be alternated to reduce pain and spasms.  Ask your caregiver about trying back exercises and gentle massage. This may be of some benefit.  Avoid feeling anxious or stressed.Stress increases muscle tension and can worsen back pain.It is important to recognize when you are anxious or stressed and learn ways to manage it.Exercise is a great option. SEEK MEDICAL CARE IF:  You have pain that is not relieved with rest or medicine.  You have pain that does not improve in 1 week.  You have new  symptoms.  You are generally not feeling well. SEEK IMMEDIATE MEDICAL CARE IF:   You have pain that radiates from your back into your legs.  You develop new bowel or bladder control problems.  You have unusual weakness or numbness in your arms or legs.  You develop nausea or vomiting.  You develop abdominal pain.  You feel faint. Document Released: 07/23/2005 Document Revised: 01/22/2012 Document Reviewed: 11/24/2013 [facility] Patient Information 2015 [place], [place]. This information is not intended to replace advice given to you by your health care provider. Make sure you discuss any questions you have with your health care provider.

## 2014-08-12 NOTE — ED Notes (Signed)
Patient is alert and orientedx4.  Patient was explained discharge instructions and they understood them with no questions.   

## 2014-08-12 NOTE — ED Provider Notes (Signed)
49 year old female comes in with some pain in her right lower extremity but is concerned about possibility of abdominal aortic aneurysm. Recent x-ray check calcification in the abdominal aorta and she is scheduled for an elective aortic ultrasound. Her exam is benign. Limited bedside ultrasound was done to evaluate the abdominal aorta and it was found to be of normal caliber. No dimension was any greater than 1.88 cm. Unfortunately, the memory on the unit was full and images were not able to be archived.  Medical screening examination/treatment/procedure(s) were conducted as a shared visit with non-physician practitioner(s) and myself.  I personally evaluated the patient during the encounter.    Brandi Fuel, MD 52/48/18 [id number]

## 2014-09-02 ENCOUNTER — Encounter: Payer: Self-pay | Admitting: Vascular Surgery

## 2014-09-03 ENCOUNTER — Ambulatory Visit (INDEPENDENT_AMBULATORY_CARE_PROVIDER_SITE_OTHER): Payer: BLUE CROSS/BLUE SHIELD | Admitting: Vascular Surgery

## 2014-09-03 ENCOUNTER — Encounter: Payer: Self-pay | Admitting: Vascular Surgery

## 2014-09-03 ENCOUNTER — Ambulatory Visit (HOSPITAL_COMMUNITY)
Admission: RE | Admit: 2014-09-03 | Discharge: 2014-09-03 | Disposition: A | Discharge status: Home / Self Care | Payer: BLUE CROSS/BLUE SHIELD | Source: Ambulatory Visit | Attending: Vascular Surgery | Admitting: Vascular Surgery

## 2014-09-03 VITALS — BP 106/72 | HR 81 | Ht 60.0 in | Wt 126.5 lb

## 2014-09-03 DIAGNOSIS — F17210 Nicotine dependence, cigarettes, uncomplicated: Secondary | ICD-10-CM

## 2014-09-03 DIAGNOSIS — Z8489 Family history of other specified conditions: Secondary | ICD-10-CM | POA: Diagnosis not present

## 2014-09-03 DIAGNOSIS — I714 Abdominal aortic aneurysm, without rupture, unspecified: Secondary | ICD-10-CM

## 2014-09-03 DIAGNOSIS — F17200 Nicotine dependence, unspecified, uncomplicated: Secondary | ICD-10-CM

## 2014-09-03 DIAGNOSIS — I700 Atherosclerosis of aorta: Secondary | ICD-10-CM | POA: Diagnosis not present

## 2014-09-03 DIAGNOSIS — I7409 Other arterial embolism and thrombosis of abdominal aorta: Secondary | ICD-10-CM | POA: Insufficient documentation

## 2014-09-03 HISTORY — DX: Nicotine dependence, unspecified, uncomplicated: F17.200

## 2014-09-03 NOTE — Progress Notes (Signed)
Referred by:  Volney Presser, [address] [place], [place] [address]  Reason for referral: incidental atherosclerosis on backfilms  History of Present Illness  Brandi Molina is a 49 y.o. (August 13, 1965) female who presents with chief complaint: back pain.  Pt was recently getting xray completed for back pain when the radiologist discovered calcification of the aortoiliac segment.  The patient notes no intermittent claudication or rest pain.  She has significant family history for cardiac disease.  Her risk factor for atherosclerosis include: smoking, some question of possible elevated LDL.  The patient is under the impression she has an AAA.  There might be a random history of aneurysm disease in one family member that died.  Past Medical History  Diagnosis Date  . Bartholin gland cyst 2008  . Depression     Past Surgical History  Procedure Laterality Date  . Cholecystectomy    . Cholecystectomy  1999    History   Social History  . Marital Status: Married    Spouse Name: N/A    Number of Children: N/A  . Years of Education: N/A   Occupational History  . Not on file.   Social History Main Topics  . Smoking status: Current Every Day Smoker -- 1.00 packs/day for 20 years    Types: Cigarettes  . Smokeless tobacco: Not on file  . Alcohol Use: No  . Drug Use: No  . Sexual Activity: Yes   Other Topics Concern  . Not on file   Social History Narrative    Family History  Problem Relation Age of Onset  . Anesthesia problems Neg Hx   . Hypotension Neg Hx   . Malignant hyperthermia Neg Hx   . Pseudochol deficiency Neg Hx   . Diabetes Mother   . Heart disease Mother   . Heart attack Mother   . AAA (abdominal aortic aneurysm) Mother   . Cancer Father   . Hypertension Brother     Current Outpatient Prescriptions  Medication Sig Dispense Refill  . HYDROcodone-acetaminophen (NORCO/VICODIN) 5-325 MG per tablet Take 1 tablet by mouth every 6 (six) hours as  needed. (Patient not taking: Reported on 09/03/2014) 15 tablet 0  . naproxen sodium (ANAPROX) 550 MG tablet     . sertraline (ZOLOFT) 50 MG tablet Take 50 mg by mouth daily.    . traMADol (ULTRAM) 50 MG tablet Take 1 tablet (50 mg total) by mouth every 6 (six) hours as needed for severe pain. (Patient not taking: Reported on 09/03/2014) 15 tablet 0   No current facility-administered medications for this visit.     No Known Allergies   REVIEW OF SYSTEMS:  (Positives checked otherwise negative)  CARDIOVASCULAR:  []  chest pain, []  chest pressure, []  palpitations, []  shortness of breath when laying flat, []  shortness of breath with exertion,  [x]  pain in feet when walking, [x]  pain in feet when laying flat, []  history of blood clot in veins (DVT), []  history of phlebitis, []  swelling in legs, []  varicose veins  PULMONARY:  []  productive cough, []  asthma, []  wheezing  NEUROLOGIC:  []  weakness in arms or legs, [x]  numbness in arms or legs, []  difficulty speaking or slurred speech, []  temporary loss of vision in one eye, []  dizziness  HEMATOLOGIC:  []  bleeding problems, []  problems with blood clotting too easily  MUSCULOSKEL:  []  joint pain, []  joint swelling  GASTROINTEST:  []  vomiting blood, []  blood in stool     GENITOURINARY:  []   burning with urination, []  blood in urine  PSYCHIATRIC:  [x]  history of major depression  INTEGUMENTARY:  []  rashes, []  ulcers  CONSTITUTIONAL:  []  fever, []  chills  For VQI Use Only  PRE-ADM LIVING: Home  AMB STATUS: Ambulatory  CAD Sx: None  PRIOR CHF: None  STRESS TEST: [x]  No, [ ]  Normal, [ ]  + ischemia, [ ]  + MI, [ ]  Both   Physical Examination  Filed Vitals:   09/03/14 0905  BP: 106/72  Pulse: 81  Height: 5' (1.524 m)  Weight: 126 lb 8 oz (57.38 kg)  SpO2: 100%    Body mass index is 24.71 kg/(m^2).  General: A&O x 3, WDWN  Head: [place]/AT  Ear/Nose/Throat: Hearing grossly intact, nares w/o erythema or drainage, oropharynx w/o  Erythema/Exudate, Mallampati score: 2  Eyes: PERRLA, EOMI  Neck: Supple, no nuchal rigidity, no palpable LAD  Pulmonary: Sym exp, good air movt, CTAB, no rales, rhonchi, & wheezing  Cardiac: RRR, Nl S1, S2, no Murmurs, rubs or gallops  Vascular: Vessel Right Left  Radial Palpable Palpable  Ulnar Palpable Palpable  Brachial Palpable Palpable  Carotid Palpable, without bruit Palpable, without bruit  Aorta Not palpable N/A  Femoral Palpable Palpable  Popliteal Not palpable Not palpable  PT  Palpable  Palpable  DP  Palpable  Palpable   Gastrointestinal: soft, NTND, -G/R, - HSM, - masses, - CVAT B, no palpable AAA  Musculoskeletal: M/S 5/5 throughout , Extremities without ischemic changes   Neurologic: CN 2-12 intact , Pain and light touch intact in extremities , Motor exam as listed above  Psychiatric: Judgment intact, Mood & affect appropriate for pt's clinical situation  Dermatologic: See M/S exam for extremity exam, no rashes otherwise noted  Lymph : No Cervical, Axillary, or Inguinal lymphadenopathy    Non-Invasive Vascular Imaging  AAA Duplex (Date: 09/03/2014)  No AAA  Current size:  2.31 cm x 2.51 cm (Date: 09/03/14)  No evidence of hemodynamic significant stenosis in common iliac arteries  Outside Studies/Documentation 10 pages of outside documents were reviewed including: outpatient labs, outpatient PCP chart, xray reading.  Medical Decision Making  Brandi Molina is a 49 y.o. female who presents with: aortoiliac disease, nicotine dependence, dyslipidemia, DDD.   I suspect this patient likely has a familial dyslipidemia along with smoking that is leading to accelerated atherosclerosis.  While she does not have hemodynamically significant peripheral arterial disease at this point, I suspect she will develop such without intervention at this point.  I would consider statin use to lower her LDL to <70 if dietary means are inadequate.  We discussed the need  for smoking cessation, as she is developing the classic pattern of aortoiliac occlusive disease in young women who smoke.  This eventually leads to a aortic and iliac stenosis if not occlusion.  I discussed in depth with the patient the nature of atherosclerosis, and emphasized the importance of maximal medical management including strict control of blood pressure, blood glucose, and lipid levels, antiplatelet agents, obtaining regular exercise, and cessation of smoking.    The patient is aware that without maximal medical management the underlying atherosclerotic disease process will progress, limiting the benefit of any interventions.  I would also recommend starting ASA 81 mg PO daily.  Thank you for allowing [place] to participate in this patient's care.  Adele Barthel, MD [facility] Office: [phone] Pager: [phone]  09/03/2014, 9:36 AM

## 2014-09-07 ENCOUNTER — Encounter: Payer: Self-pay | Admitting: Vascular Surgery

## 2015-08-15 ENCOUNTER — Ambulatory Visit: Payer: BLUE CROSS/BLUE SHIELD

## 2015-08-22 ENCOUNTER — Ambulatory Visit: Payer: BLUE CROSS/BLUE SHIELD | Attending: Internal Medicine

## 2015-08-27 ENCOUNTER — Encounter (HOSPITAL_COMMUNITY): Payer: Self-pay | Admitting: Emergency Medicine

## 2015-08-27 ENCOUNTER — Emergency Department (HOSPITAL_COMMUNITY): Payer: BLUE CROSS/BLUE SHIELD

## 2015-08-27 ENCOUNTER — Emergency Department (HOSPITAL_COMMUNITY)
Admission: EM | Admit: 2015-08-27 | Discharge: 2015-08-27 | Disposition: A | Discharge status: Home / Self Care | Payer: Self-pay | Attending: Emergency Medicine | Admitting: Emergency Medicine

## 2015-08-27 DIAGNOSIS — R1010 Upper abdominal pain, unspecified: Secondary | ICD-10-CM | POA: Insufficient documentation

## 2015-08-27 DIAGNOSIS — F17210 Nicotine dependence, cigarettes, uncomplicated: Secondary | ICD-10-CM | POA: Insufficient documentation

## 2015-08-27 DIAGNOSIS — Z79899 Other long term (current) drug therapy: Secondary | ICD-10-CM | POA: Insufficient documentation

## 2015-08-27 DIAGNOSIS — M546 Pain in thoracic spine: Secondary | ICD-10-CM | POA: Insufficient documentation

## 2015-08-27 DIAGNOSIS — Z8742 Personal history of other diseases of the female genital tract: Secondary | ICD-10-CM | POA: Insufficient documentation

## 2015-08-27 LAB — COMPREHENSIVE METABOLIC PANEL
ALBUMIN: 3.9 g/dL (ref 3.5–5.0)
ALT: 16 U/L (ref 14–54)
ANION GAP: 10 (ref 5–15)
AST: 23 U/L (ref 15–41)
Alkaline Phosphatase: 86 U/L (ref 38–126)
BUN: 8 mg/dL (ref 6–20)
CHLORIDE: 106 mmol/L (ref 101–111)
CO2: 26 mmol/L (ref 22–32)
Calcium: 9.4 mg/dL (ref 8.9–10.3)
Creatinine, Ser: 0.72 mg/dL (ref 0.44–1.00)
GFR calc Af Amer: >60 mL/min (ref >60)
Glucose, Bld: 109 mg/dL — ABNORMAL HIGH (ref 65–99)
POTASSIUM: 4.2 mmol/L (ref 3.5–5.1)
Sodium: 142 mmol/L (ref 135–145)
TOTAL PROTEIN: 6.5 g/dL (ref 6.5–8.1)
Total Bilirubin: 0.3 mg/dL (ref 0.3–1.2)

## 2015-08-27 LAB — CBC WITH DIFFERENTIAL/PLATELET
BASOS ABS: 0 10*3/uL (ref 0.0–0.1)
Basophils Relative: 0 %
EOS ABS: 0.2 10*3/uL (ref 0.0–0.7)
EOS PCT: 1 %
HCT: 42.2 % (ref 36.0–46.0)
Hemoglobin: 14.7 g/dL (ref 12.0–15.0)
Lymphocytes Relative: 21 %
Lymphs Abs: 2.4 10*3/uL (ref 0.7–4.0)
MCH: 32.2 pg (ref 26.0–34.0)
MCHC: 34.8 g/dL (ref 30.0–36.0)
MCV: 92.5 fL (ref 78.0–100.0)
MONO ABS: 0.6 10*3/uL (ref 0.1–1.0)
Monocytes Relative: 5 %
Neutro Abs: 8 10*3/uL — ABNORMAL HIGH (ref 1.7–7.7)
Neutrophils Relative %: 73 %
PLATELETS: 303 10*3/uL (ref 150–400)
RBC: 4.56 MIL/uL (ref 3.87–5.11)
RDW: 12.4 % (ref 11.5–15.5)
WBC: 11.2 10*3/uL — AB (ref 4.0–10.5)

## 2015-08-27 LAB — URINALYSIS, ROUTINE W REFLEX MICROSCOPIC
Bilirubin Urine: NEGATIVE
GLUCOSE, UA: NEGATIVE mg/dL
KETONES UR: NEGATIVE mg/dL
LEUKOCYTES UA: NEGATIVE
NITRITE: NEGATIVE
PROTEIN: NEGATIVE mg/dL
Specific Gravity, Urine: 1.01 (ref 1.005–1.030)
pH: 5.5 (ref 5.0–8.0)

## 2015-08-27 LAB — URINE MICROSCOPIC-ADD ON: WBC, UA: NONE SEEN WBC/hpf (ref 0–5)

## 2015-08-27 LAB — LIPASE, BLOOD: LIPASE: 31 U/L (ref 11–51)

## 2015-08-27 MED ORDER — NAPROXEN 500 MG PO TABS: 500.0000 mg | ORAL_TABLET | Freq: Two times a day (BID) | ORAL | Status: DC

## 2015-08-27 MED ORDER — KETOROLAC TROMETHAMINE 60 MG/2ML IM SOLN
60.0000 mg | Freq: Once | INTRAMUSCULAR | Status: AC
  Administered 2015-08-27: 60 mg via INTRAMUSCULAR
  Filled 2015-08-27: qty 2

## 2015-08-27 MED ORDER — CYCLOBENZAPRINE HCL 10 MG PO TABS: 10.0000 mg | ORAL_TABLET | Freq: Two times a day (BID) | ORAL | Status: DC | PRN

## 2015-08-27 NOTE — Discharge Instructions (Signed)
Naprosyn for pain as prescribed. Flexeril for spasms. Try heating pads. Stretches. Follow up with primary care doctor.    Back Pain, Adult Back pain is very common. The pain often gets better over time. The cause of back pain is usually not dangerous. Most people can learn to manage their back pain on their own.  HOME CARE  Watch your back pain for any changes. The following actions may help to lessen any pain you are feeling: 1. Stay active. Start with short walks on flat ground if you can. Try to walk farther each day. 2. Exercise regularly as told by your doctor. Exercise helps your back heal faster. It also helps avoid future injury by keeping your muscles strong and flexible. 3. Do not sit, drive, or stand in one place for more than 30 minutes. 4. Do not stay in bed. Resting more than 1-2 days can slow down your recovery. 5. Be careful when you bend or lift an object. Use good form when lifting: 1. Bend at your knees. 2. Keep the object close to your body. 3. Do not twist. 6. Sleep on a firm mattress. Lie on your side, and bend your knees. If you lie on your back, put a pillow under your knees. 7. Take medicines only as told by your doctor. 8. Put ice on the injured area. 1. Put ice in a plastic bag. 2. Place a towel between your skin and the bag. 3. Leave the ice on for 20 minutes, 2-3 times a day for the first 2-3 days. After that, you can switch between ice and heat packs. 9. Avoid feeling anxious or stressed. Find good ways to deal with stress, such as exercise. 10. Maintain a healthy weight. Extra weight puts stress on your back. GET HELP IF:  1. You have pain that does not go away with rest or medicine. 2. You have worsening pain that goes down into your legs or buttocks. 3. You have pain that does not get better in one week. 4. You have pain at night. 5. You lose weight. 6. You have a fever or chills. GET HELP RIGHT AWAY IF:  1. You cannot control when you poop (bowel  movement) or pee (urinate). 2. Your arms or legs feel weak. 3. Your arms or legs lose feeling (numbness). 4. You feel sick to your stomach (nauseous) or throw up (vomit). 5. You have belly (abdominal) pain. 6. You feel like you may pass out (faint).   This information is not intended to replace advice given to you by your health care provider. Make sure you discuss any questions you have with your health care provider.   Document Released: 01/09/2008 Document Revised: 08/13/2014 Document Reviewed: 11/24/2013 Elsevier Interactive Patient Education 2016 [place].   Back Exercises If you have pain in your back, do these exercises 2-3 times each day or as told by your doctor. When the pain goes away, do the exercises once each day, but repeat the steps more times for each exercise (do more repetitions). If you do not have pain in your back, do these exercises once each day or as told by your doctor. EXERCISES Single Knee to Chest Do these steps 3-5 times in a row for each leg: 11. Lie on your back on a firm bed or the floor with your legs stretched out. 12. Bring one knee to your chest. 13. Hold your knee to your chest by grabbing your knee or thigh. 14. Pull on your knee until you  feel a gentle stretch in your lower back. 15. Keep doing the stretch for 10-30 seconds. 16. Slowly let go of your leg and straighten it. Pelvic Tilt Do these steps 5-10 times in a row: 7. Lie on your back on a firm bed or the floor with your legs stretched out. 8. Bend your knees so they point up to the ceiling. Your feet should be flat on the floor. 9. Tighten your lower belly (abdomen) muscles to press your lower back against the floor. This will make your tailbone point up to the ceiling instead of pointing down to your feet or the floor. 10. Stay in this position for 5-10 seconds while you gently tighten your muscles and breathe evenly. Cat-Cow Do these steps until your lower back bends more  easily: 7. Get on your hands and knees on a firm surface. Keep your hands under your shoulders, and keep your knees under your hips. You may put padding under your knees. 8. Let your head hang down, and make your tailbone point down to the floor so your lower back is round like the back of a cat. 9. Stay in this position for 5 seconds. 10. Slowly lift your head and make your tailbone point up to the ceiling so your back hangs low (sags) like the back of a cow. 11. Stay in this position for 5 seconds. Press-Ups Do these steps 5-10 times in a row: 1. Lie on your belly (face-down) on the floor. 2. Place your hands near your head, about shoulder-width apart. 3. While you keep your back relaxed and keep your hips on the floor, slowly straighten your arms to raise the top half of your body and lift your shoulders. Do not use your back muscles. To make yourself more comfortable, you may change where you place your hands. 4. Stay in this position for 5 seconds. 5. Slowly return to lying flat on the floor. Bridges Do these steps 10 times in a row: 1. Lie on your back on a firm surface. 2. Bend your knees so they point up to the ceiling. Your feet should be flat on the floor. 3. Tighten your butt muscles and lift your butt off of the floor until your waist is almost as high as your knees. If you do not feel the muscles working in your butt and the back of your thighs, slide your feet 1-2 inches farther away from your butt. 4. Stay in this position for 3-5 seconds. 5. Slowly lower your butt to the floor, and let your butt muscles relax. If this exercise is too easy, try doing it with your arms crossed over your chest. Belly Crunches Do these steps 5-10 times in a row: 1. Lie on your back on a firm bed or the floor with your legs stretched out. 2. Bend your knees so they point up to the ceiling. Your feet should be flat on the floor. 3. Cross your arms over your chest. 4. Tip your chin a little bit  toward your chest but do not bend your neck. 5. Tighten your belly muscles and slowly raise your chest just enough to lift your shoulder blades a tiny bit off of the floor. 6. Slowly lower your chest and your head to the floor. Back Lifts Do these steps 5-10 times in a row: 1. Lie on your belly (face-down) with your arms at your sides, and rest your forehead on the floor. 2. Tighten the muscles in your legs and your butt. 3.  Slowly lift your chest off of the floor while you keep your hips on the floor. Keep the back of your head in line with the curve in your back. Look at the floor while you do this. 4. Stay in this position for 3-5 seconds. 5. Slowly lower your chest and your face to the floor. GET HELP IF:  Your back pain gets a lot worse when you do an exercise.  Your back pain does not lessen 2 hours after you exercise. If you have any of these problems, stop doing the exercises. Do not do them again unless your doctor says it is okay. GET HELP RIGHT AWAY IF:  You have sudden, very bad back pain. If this happens, stop doing the exercises. Do not do them again unless your doctor says it is okay.   This information is not intended to replace advice given to you by your health care provider. Make sure you discuss any questions you have with your health care provider.   Document Released: 08/25/2010 Document Revised: 04/13/2015 Document Reviewed: 09/16/2014 Elsevier Interactive Patient Education 2016 [employer].   Emergency Department Resource Guide 1) Find a Doctor and Pay Out of Pocket Although you won't have to find out who is covered by your insurance plan, it is a good idea to ask around and get recommendations. You will then need to call the office and see if the doctor you have chosen will accept you as a new patient and what types of options they offer for patients who are self-pay. Some doctors offer discounts or will set up payment plans for their patients who do not have  insurance, but you will need to ask so you aren't surprised when you get to your appointment.  2) Contact Your Local Health Department Not all health departments have doctors that can see patients for sick visits, but many do, so it is worth a call to see if yours does. If you don't know where your local health department is, you can check in your phone book. The CDC also has a tool to help you locate your state's health department, and many state websites also have listings of all of their local health departments.  3) Find a [facility] If your illness is not likely to be very severe or complicated, you may want to try a walk in clinic. These are popping up all over the country in pharmacies, drugstores, and shopping centers. They're usually staffed by nurse practitioners or physician assistants that have been trained to treat common illnesses and complaints. They're usually fairly quick and inexpensive. However, if you have serious medical issues or chronic medical problems, these are probably not your best option.  No Primary Care Doctor: - Call Health Connect at  [phone] - they can help you locate a primary care doctor that  accepts your insurance, provides certain services, etc. - Physician Referral Service- [phone]  Chronic Pain Problems: Organization         Address  Phone   Notes  [facility]  [phone] Patients need to be referred by their primary care doctor.   Medication Assistance: Organization         Address  Phone   Notes  [facility] Medication [facility] [address] [place], [place] [address] [phone] --Must be a resident of [facility] -- Must have NO insurance coverage whatsoever (no Medicaid/ Medicare, etc.) -- The pt. MUST have a primary care doctor that  directs their care regularly and follows them in the community   MedAssist  [phone]   [employer]  [phone]    Agencies that provide  inexpensive medical care: Organization         Address  Phone   Notes  [place]  [phone]   [facility]    [phone]   [facility] [place], [place] [address] [phone]   [place] [address] [place] [phone]   Planned Parenthood    [phone]   [facility]    [phone]   [place] and [place] [address], [place] Phone:  [phone], Fax:  [phone] Hours of Operation:  9 am - 6 pm, M-F.  Also accepts Medicaid/Medicare and self-pay.  [facility] for [place], [address], [place] Phone: [phone], Fax: [phone]. Hours of Operation:  8:30 am - 5:30 pm, M-F.  Also accepts Medicaid and self-pay.  [facility] [place] [address] [place] Phone: [phone]   [place], [place], [place] [phone], Ext. [phone] Mondays & Thursdays: 7-9 AM.  First 15 patients are seen on a first come, first serve basis.    [place] Providers:  Organization         Address  Phone   Notes  [facility] [address], [place] [phone] Also accepts self-pay patients.  [facility] [id number] [place], [place]  [phone]   [place], [address], [place] [phone]   [facility] [address], [place] [phone]   Lucianne Lei [address], [place]   [phone] Only accepts [place] Access [place] patients after they have their name applied to their card.   Self-Pay (no insurance) in [facility]:  Organization         Address  Phone   Notes  Sickle Cell Patients, [facility] Internal Medicine [place] [phone]   [facility] Urgent  Care [place] [phone]   [facility] [place]  [place], [place], [place] [phone]   [facility]/Dr. Osei-Bonsu  [address] [place] or [address], [place] [phone] Phone number for both [place] and [place] locations is the same.  Urgent Medical and [facility] [address] [place] [phone]   [facility] [address] [place] or [address] [phone]   [facility] [address] [place] [phone], phone; [phone], fax Sees patients 1st and 3rd Saturday of every month.  Must not qualify for public or private insurance (i.e. Medicaid, Medicare, [place] Health Choice, Veterans' Benefits)  Household income should be no more than 200% of the poverty level The clinic cannot treat you if you are pregnant or think you are pregnant  Sexually transmitted diseases are not treated at the clinic.    Dental Care: Organization         Address  Phone  Notes  [facility] [address] [place], [place] [phone] Accepts children up  to age 45 who are enrolled in Medicaid or [place] Health Choice; pregnant women with a Medicaid card; and children who have applied for Medicaid or [place] Health Choice, but were declined, whose parents can pay a reduced fee at time of service.  [facility] Department of [facility]  [address], [place] [phone] Accepts children up to age 33 who are enrolled in [place] or [place]; pregnant women with a Medicaid card; and children who have applied for Medicaid or [place] Health Choice, but were declined, whose parents can pay a reduced fee at time of service.  [facility] Adult Dental Access PROGRAM  [place] [phone] Patients are seen by appointment only. Walk-ins are  not accepted. [place] will see patients 66 years of age and older. Monday - Tuesday (8am-5pm) Most Wednesdays (8:30-5pm) $30 per visit, cash only  [facility] Adult Dental Access PROGRAM  [address], [facility] [phone] Patients are seen by appointment only. Walk-ins are not accepted. [place] will see patients 83 years of age and older. One Wednesday Evening (Monthly: Volunteer Based).  $30 per visit, cash only  [place]  [phone] for adults; Children under age 39, call Graduate Pediatric Dentistry at [phone]. Children aged 32-14, please call [phone] to request a pediatric application.  Dental services are provided in all areas of dental care including fillings, crowns and bridges, complete and partial dentures, implants, gum treatment, root canals, and extractions. Preventive care is also provided. Treatment is provided to both adults and children. Patients are selected via a lottery and there is often a waiting list.   [facility] [address] [place]  [phone] [email or web address]   [facility] [address] [place], [place] [phone], Ext. [phone] Second and Fourth Thursday of each month, opens at 6:30 AM; Clinic ends at 9 AM.  Patients are seen on a first-come first-served basis, and a limited number are seen during each clinic.   [facility]  [address] [place], [place] [phone]   Eligibility Requirements You must have lived in [place], [place], or [place] counties for at least the last three months.   You cannot be eligible for state or federal sponsored [employer], including [employer], [place], or [employer].   You generally cannot be eligible for healthcare insurance through your employer.    How to apply: Eligibility screenings are held every Tuesday and Wednesday afternoon from 1:00 pm until 4:00 pm. You do not need an appointment for  the interview!  [facility] [address], [place], [place]   [place]  [place] Department  [place]  [phone]    Behavioral Health Resources in the Community: Intensive Outpatient Programs Organization         Address  Phone  Notes  [place]. [address] [place], [place] [phone]   [facility] Outpatient [address] [place], [place]   ADS: Alcohol & Drug Svcs [address] [place], [place]   [place] [address],  [place], [place] or [phone]   Substance Abuse Resources Organization         Address  Phone  Notes  Alcohol and Drug Services  [phone]   Addiction Recovery  [facility]  [phone]   [facility] [place]   Chinita Pester  [phone]   Residential & Outpatient Substance Abuse Program  [phone]   Psychological Services Organization         Address  Phone  Notes  [facility] [place]  [place]  [phone]   [place] [address] [place] or [phone]    Mobile Crisis Teams Organization         Address  Phone  Notes  [facility] Alternatives, Mobile Crisis Care Unit  [phone]   [facility] Psychotherapeutic Services  [address] [place], [place]   [place] [address] [place] [phone]    Self-Help/Support Groups Organization         Address  Phone             Notes  [place]. of [place] - variety of support groups  [place] Call for more information  Narcotics Anonymous (NA), [facility] [address], [employer] [place]  2 meetings at this location   Special educational needs teacher         Address  Phone  Notes  ASAP Residential Treatment  [place],    [place]  [phone]   [facility]  [address] [place] [id number], [place], [place]   [place], [place] [phone] Admissions: 8am-3pm M-F  Incentives Substance [place] [address],    [place], [place] [phone]   [facility] [address] [place], [place], [place]   The [facility] [address],  [place], [place]   [facility] Programs - Intensive Outpatient [address] Kristeen [address], [place], [place]   [facility] ([place].,  [place], [place] [phone] or [phone]   [facility]idential Treatment Services (RTS) [address], [place], [place] Accepts Medicaid  [facility] [place] [address],  [place] [phone] Substance Abuse/Addiction Treatment   [facility] Organization         Address  Phone  Notes  [facility]  [phone]   Domenic Schwab, PhD [address] [place]lis [place], [place]   [phone] or [phone]   [place], [place] [phone]   [facility] [address], [place], [place] [phone] Insurance/Medicaid/sponsorship through [facility] and Families [address], [address] [place]                                    [place], [place] [phone] [place] [address], [place] [phone]    Dr. Adele Schilder  [phone]   [facility] Dept. 1) [address], [place] 2) [place] 3)  [place] [address], [place] [phone]  [phone]   [place] [phone] or [phone] (After Hours)

## 2015-08-27 NOTE — ED Notes (Signed)
Patient here with back pain for the last month.  Patient states that she was awoken from sleep due to the pain.  She states that she has not had any recent injury.  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  She states that OTC meds have not been helping the pain.

## 2015-08-27 NOTE — ED Notes (Signed)
Patient transported to X-ray 

## 2015-08-27 NOTE — ED Provider Notes (Signed)
CSN: [id number]     Arrival date & time 08/27/15  0501 History   First MD Initiated Contact with Patient 08/27/15 [id number]     Chief Complaint  Patient presents with  . Back Pain     (Consider location/radiation/quality/duration/timing/severity/associated sxs/prior Treatment) HPI Brandi Molina is a 50 y.o. female with hx of depression and anxiety presents to ED with complaint of a back pain. Pt states pain started 1 month ago. She reports hx of a fall a year ago, states had pain at that time as well for which she was seen. Pt reports pain improved up until a month ago. States no new injuries. Pain is in the mid back, sometimes radiating into upper abdomen. States painful to move and take a deep breath. States "there is something wrong and you have to figure out what it is." pt states she woke up in the middle of the night hurting worse. Pt has been taking ibuprofen but states its not helping. States cannot get apt with PCP for another month. pt very anxious and tearful.   Past Medical History  Diagnosis Date  . Bartholin gland cyst 2008  . Depression    Past Surgical History  Procedure Laterality Date  . Cholecystectomy    . Cholecystectomy  1999   Family History  Problem Relation Age of Onset  . Anesthesia problems Neg Hx   . Hypotension Neg Hx   . Malignant hyperthermia Neg Hx   . Pseudochol deficiency Neg Hx   . Diabetes Mother   . Heart disease Mother   . Heart attack Mother   . AAA (abdominal aortic aneurysm) Mother   . Cancer Father   . Hypertension Brother    Social History  Substance Use Topics  . Smoking status: Current Every Day Smoker -- 1.00 packs/day for 20 years    Types: Cigarettes  . Smokeless tobacco: None  . Alcohol Use: No   OB History    Gravida Para Term Preterm AB TAB SAB Ectopic Multiple Living   2 1 1  1 1    1      Review of Systems  Constitutional: Negative for fever and chills.  Respiratory: Negative for cough, chest tightness and shortness  of breath.   Cardiovascular: Negative for chest pain, palpitations and leg swelling.  Gastrointestinal: Positive for abdominal pain. Negative for nausea, vomiting and diarrhea.  Genitourinary: Negative for dysuria, flank pain and pelvic pain.  Musculoskeletal: Positive for myalgias and back pain. Negative for arthralgias, neck pain and neck stiffness.  Skin: Negative for rash.  Neurological: Negative for dizziness, weakness and headaches.  All other systems reviewed and are negative.     Allergies  Review of patient's allergies indicates no known allergies.  Home Medications   Prior to Admission medications   Medication Sig Start Date End Date Taking? Authorizing Provider  HYDROcodone-acetaminophen (NORCO/VICODIN) 5-325 MG per tablet Take 1 tablet by mouth every 6 (six) hours as needed. Patient not taking: Reported on 09/03/2014 10/02/13   Gregor Hams, MD  naproxen sodium (ANAPROX) 550 MG tablet  07/09/14   Historical Provider, MD  sertraline (ZOLOFT) 50 MG tablet Take 50 mg by mouth daily.    Historical Provider, MD  traMADol (ULTRAM) 50 MG tablet Take 1 tablet (50 mg total) by mouth every 6 (six) hours as needed for severe pain. Patient not taking: Reported on 09/03/2014 08/12/14   Antonietta Breach, PA-C   BP 120/98 mmHg  Pulse 114  Temp(Src) 98.2 F (36.8 C) (  Oral)  Resp 18  Ht 5\' 1"  (1.549 m)  Wt 54.432 kg  BMI 22.69 kg/m2  SpO2 100% Physical Exam  Constitutional: She appears well-developed and well-nourished. No distress.  HENT:  Head: Normocephalic.  Eyes: Conjunctivae are normal.  Neck: Neck supple.  Cardiovascular: Normal rate, regular rhythm and normal heart sounds.   Pulmonary/Chest: Effort normal and breath sounds normal. No respiratory distress. She has no wheezes. She has no rales.  Abdominal: Soft. Bowel sounds are normal. She exhibits no distension. There is no tenderness. There is no rebound.  No CVA tenderness bilaterally.  Musculoskeletal: She exhibits no edema.   TTP over midline thoracic spine and bilateral perispinal mucles  Neurological: She is alert.  Skin: Skin is warm and dry.  Psychiatric: She has a normal mood and affect. Her behavior is normal.  Nursing note and vitals reviewed.   ED Course  Procedures (including critical care time) Labs Review Labs Reviewed  CBC WITH DIFFERENTIAL/PLATELET - Abnormal; Notable for the following:    WBC 11.2 (*)    Neutro Abs 8.0 (*)    All other components within normal limits  COMPREHENSIVE METABOLIC PANEL - Abnormal; Notable for the following:    Glucose, Bld 109 (*)    All other components within normal limits  URINALYSIS, ROUTINE W REFLEX MICROSCOPIC (NOT AT [facility]) - Abnormal; Notable for the following:    Hgb urine dipstick MODERATE (*)    All other components within normal limits  URINE MICROSCOPIC-ADD ON - Abnormal; Notable for the following:    Squamous Epithelial / LPF 0-5 (*)    Bacteria, UA RARE (*)    All other components within normal limits  LIPASE, BLOOD    Imaging Review Dg Chest 2 View  08/27/2015  CLINICAL DATA:  50 year old female with back pain EXAM: CHEST  2 VIEW COMPARISON:  Radiograph dated 10/19/2003 FINDINGS: Two views of the chest do not demonstrate a focal consolidation. There is no pleural effusion or pneumothorax. The cardiac silhouette is within normal limits. The osseous structures appear unremarkable. IMPRESSION: No active cardiopulmonary disease. Electronically Signed   By: Anner Crete M.D.   On: 08/27/2015 07:00   I have personally reviewed and evaluated these images and lab results as part of my medical decision-making.   EKG Interpretation None      MDM   Final diagnoses:  Bilateral thoracic back pain    patient with thoracic back pain, no injuries. She is describing pain radiating to her abdomen at times. She is concerned this could be something with her lungs versus her aorta. She had a workup by vascular one year ago and had ultrasound which  showed no evidence of enlarged aorta or AAA. Patient is neurovascularly intact on exam. I will check labs including LFTs and lipase, will get urinalysis, x-ray. Toradol for pain.   7:46 AM Patient's labs are unremarkable, chest x-ray is negative. Urinalysis shows moderate hemoglobin. Patient states that she was told in the past that she has trace blood in her urine every time she gets urinalysis. I do not think patient has a kidney stone. Will discharge home with naproxen and Flexeril. Most likely musculoskeletal pain. Instructed to follow-up with primary care doctor.  Filed Vitals:   08/27/15 0506 08/27/15 0742 08/27/15 0745  BP: 120/98 94/64 109/69  Pulse: 114 74 66  Temp: 98.2 F (36.8 C) 98.1 F (36.7 C)   TempSrc: Oral Oral   Resp: 18 16   Height: 5\' 1"  (1.549 m)  Weight: 54.432 kg    SpO2: 100% 99% 98%     Jeannett Senior, PA-C 08/27/15 0747  Lacretia Leigh, MD 08/29/15 1455

## 2015-09-19 ENCOUNTER — Ambulatory Visit (INDEPENDENT_AMBULATORY_CARE_PROVIDER_SITE_OTHER): Payer: BLUE CROSS/BLUE SHIELD | Admitting: Family Medicine

## 2015-09-19 ENCOUNTER — Encounter: Payer: Self-pay | Admitting: Family Medicine

## 2015-09-19 ENCOUNTER — Ambulatory Visit (HOSPITAL_COMMUNITY)
Admission: RE | Admit: 2015-09-19 | Discharge: 2015-09-19 | Disposition: A | Discharge status: Home / Self Care | Payer: BLUE CROSS/BLUE SHIELD | Source: Ambulatory Visit | Attending: Family Medicine | Admitting: Family Medicine

## 2015-09-19 VITALS — BP 117/72 | HR 86 | Temp 98.2°F | Resp 14 | Ht 61.0 in | Wt 129.0 lb

## 2015-09-19 DIAGNOSIS — M4319 Spondylolisthesis, multiple sites in spine: Secondary | ICD-10-CM | POA: Diagnosis not present

## 2015-09-19 DIAGNOSIS — F411 Generalized anxiety disorder: Secondary | ICD-10-CM

## 2015-09-19 DIAGNOSIS — N951 Menopausal and female climacteric states: Secondary | ICD-10-CM | POA: Diagnosis not present

## 2015-09-19 DIAGNOSIS — I2510 Atherosclerotic heart disease of native coronary artery without angina pectoris: Secondary | ICD-10-CM | POA: Insufficient documentation

## 2015-09-19 DIAGNOSIS — I7409 Other arterial embolism and thrombosis of abdominal aorta: Secondary | ICD-10-CM

## 2015-09-19 DIAGNOSIS — G8929 Other chronic pain: Secondary | ICD-10-CM

## 2015-09-19 DIAGNOSIS — M4310 Spondylolisthesis, site unspecified: Secondary | ICD-10-CM

## 2015-09-19 DIAGNOSIS — M549 Dorsalgia, unspecified: Secondary | ICD-10-CM

## 2015-09-19 DIAGNOSIS — M545 Low back pain: Secondary | ICD-10-CM | POA: Insufficient documentation

## 2015-09-19 DIAGNOSIS — D72829 Elevated white blood cell count, unspecified: Secondary | ICD-10-CM

## 2015-09-19 DIAGNOSIS — F17210 Nicotine dependence, cigarettes, uncomplicated: Secondary | ICD-10-CM | POA: Diagnosis not present

## 2015-09-19 DIAGNOSIS — M47896 Other spondylosis, lumbar region: Secondary | ICD-10-CM | POA: Insufficient documentation

## 2015-09-19 LAB — CBC WITH DIFFERENTIAL/PLATELET
Basophils Absolute: 0 10*3/uL (ref 0.0–0.1)
Basophils Relative: 0 % (ref 0–1)
Eosinophils Absolute: 0.1 10*3/uL (ref 0.0–0.7)
Eosinophils Relative: 1 % (ref 0–5)
HEMATOCRIT: 43.8 % (ref 36.0–46.0)
HEMOGLOBIN: 14.9 g/dL (ref 12.0–15.0)
LYMPHS PCT: 36 % (ref 12–46)
Lymphs Abs: 2.9 10*3/uL (ref 0.7–4.0)
MCH: 31.6 pg (ref 26.0–34.0)
MCHC: 34 g/dL (ref 30.0–36.0)
MCV: 93 fL (ref 78.0–100.0)
MONO ABS: 0.5 10*3/uL (ref 0.1–1.0)
MONOS PCT: 6 % (ref 3–12)
MPV: 9.7 fL (ref 8.6–12.4)
NEUTROS ABS: 4.6 10*3/uL (ref 1.7–7.7)
Neutrophils Relative %: 57 % (ref 43–77)
Platelets: 348 10*3/uL (ref 150–400)
RBC: 4.71 MIL/uL (ref 3.87–5.11)
RDW: 12.8 % (ref 11.5–15.5)
WBC: 8.1 10*3/uL (ref 4.0–10.5)

## 2015-09-19 LAB — POCT URINALYSIS DIP (DEVICE)
BILIRUBIN URINE: NEGATIVE
Glucose, UA: NEGATIVE mg/dL
Ketones, ur: NEGATIVE mg/dL
LEUKOCYTES UA: NEGATIVE
Nitrite: NEGATIVE
Protein, ur: NEGATIVE mg/dL
Urobilinogen, UA: 0.2 mg/dL (ref 0.0–1.0)
pH: 5.5 (ref 5.0–8.0)

## 2015-09-19 MED ORDER — BUSPIRONE HCL 5 MG PO TABS: 5.0000 mg | ORAL_TABLET | Freq: Two times a day (BID) | ORAL | Status: DC

## 2015-09-19 MED FILL — ?BUSPIRONE HCL 5 MG TABLET: 5 | 30 days supply | Qty: 60 | Fill #0

## 2015-09-19 NOTE — Patient Instructions (Addendum)
Will send for a spinal xray Will check vitamin D level Will start a trial of Buspar 5 mg twice daily for anxiety. Will follow up in 6 weeks for re-evaluation Will follow up by phone with laboratory results Atherosclerosis Atherosclerosis, or hardening of the arteries, is the buildup of plaque within the major arteries in the body. Plaque is made up of fats (lipids), cholesterol, calcium, and fibrous tissue. Plaque can narrow or block blood flow within an artery. Plaque can break off and cause damage to the affected organ. Plaque can also "rupture." When plaque ruptures within an artery, a clot can form, causing a sudden (acute) blockage of the artery. Untreated atherosclerosis can cause serious health problems or death.  RISK FACTORS  High cholesterol levels.  Smoking.  Obesity.  Lack of activity or exercise.  Eating a diet high in saturated fat.  Family history.  Diabetes. SIGNS AND SYMPTOMS  Symptoms of atherosclerosis can occur when blood flow to an artery is slowed or blocked. Severity and onset of symptoms depends on how extensive the narrowing or blockage is. A sudden plaque rupture can bring immediate, life-threatening symptoms. Atherosclerosis can affect different arteries in the body, for example:  Coronary arteries. The coronary arteries supply the heart with blood. When the coronary arteries are narrowed or blocked from atherosclerosis, this is known as coronary artery disease (CAD). CAD can cause a heart attack. Common heart attack symptoms include:  Chest pain or pain that radiates to the neck, arm, jaw, or in the upper, middle back (mid-scapular pain).  Shortness of breath without cause.  Profuse sweating while at rest.  Irregular heartbeats.  Nausea or gastrointestinal upset.  Carotid arteries. The carotid arteries supply the brain with blood. They are located on each side of your neck. When blood flow to these arteries is slowed or blocked, a transient ischemic  attack (TIA) or stroke can occur. A TIA is considered a "mini-stroke" or "warning stroke." TIA symptoms are the same as stroke symptoms, but they are temporary and last less than 24 hours. A stroke can cause permanent damage or death. Common TIA and stroke symptoms include:  Sudden numbness or weakness to one side of your body, such as the face, arm, or leg.  Sudden confusion or trouble speaking or understanding.  Sudden trouble seeing out of one or both eyes.  Sudden trouble walking, loss of balance, or dizziness.  Sudden, severe headache with no known cause.  Arteries in the legs. When arteries in the lower legs become narrowed or blocked, this is known as peripheral vascular disease (PVD). PVD can cause a symptom called claudication. Claudication is pain or a burning feeling in your legs when walking or exercising and usually goes away with rest. Very severe PVD can cause pain in your legs while at rest.  Renal arteries. The renal arteries supply the kidneys with blood. Blockage of the renal arteries can cause a decline in kidney function or high blood pressure (hypertension).  Gastrointestinal arteries (mesenteric circulation). Abdominal pain may occur after eating. DIAGNOSIS  Your health care provider may perform the following tests to diagnose atherosclerosis:  Blood tests.  Stress test.  Echocardiogram.  Nuclear scan.  Ankle/brachial index.  Ultrasonography.  Computed tomography (CT) scan.  Angiography. TREATMENT  Atherosclerosis treatment includes the following:  Lifestyle changes such as:  Quitting smoking. Your health care provider can help you with smoking cessation.  Eating a diet low in saturated fat. A registered dietitian can educate you on healthy food options, such  as helping you understand the difference between good fat and bad fat.  Following an exercise program approved by your health care provider.  Maintaining a healthy weight. Lose weight as  approved by your health care provider.  Have your cholesterol levels checked as directed by your health care provider.  Medicines. Cholesterol medicines can help slow or stop the progression of atherosclerosis.  Different procedural or surgical interventions to treat atherosclerosis include:  Balloon angioplasty. The technical name for balloon angioplasty is percutaneous transluminal angioplasty (PTA). In this procedure, a catheter with a small balloon at the tip is inserted through the blocked or narrowed artery. The balloon is then inflated. When the balloon is inflated, the fatty plaque is compressed against the artery wall, allowing better blood flow within the artery.  Balloon angioplasty and stenting. In this procedure, balloon angioplasty is combined with a stenting procedure. A stent is a small, metal mesh tube that keeps the artery open. After the artery is opened up by the balloon technique, the stent is then deployed. The stent is permanent.  Open heart surgery or bypass surgery. To perform this type of surgery, a healthy vessel is first "harvested" from either the leg or arm. The harvested vessel is then used to "bypass" the blocked atherosclerotic vessel so new blood flow can be established.  Atherectomy. Atherectomy is a procedure that uses a catheter with a sharp blade to remove plaque from an artery. A chamber in the catheter collects the plaque.  Endarterectomy. An endarterectomy is a surgical procedure where a surgeon removes plaque from an artery.  Amputation. When blockages in the lower legs are very severe and circulation cannot be restored, amputation may be required. SEEK IMMEDIATE MEDICAL CARE IF:  You are having heart attack symptoms, such as:  Chest pain or pain that radiates to the neck, arm, jaw, or in the upper, middle back (mid-scapular pain).  Shortness of breath without cause.  Profuse sweating while at rest.  Irregular heartbeats.  Nausea or  gastrointestinal upset.  You are having stroke symptoms, such as sudden:  Numbness or weakness to one side of your body, such as the face, arm, or leg.  Confusion or trouble speaking or understanding.  Trouble seeing out of one or both eyes.  Trouble walking, loss of balance, or dizziness.  Severe headache with no known cause.  Your hands or feet are bluish, cold, or you have pain in them.  You have bad abdominal pain after eating. Symptoms of heart attack or stroke may represent a serious problem that is an emergency. Do not wait to see if the symptoms will go away. Get medical help right away. Call your local emergency services (911 in the [place] Do not drive yourself to the hospital.   This information is not intended to replace advice given to you by your health care provider. Make sure you discuss any questions you have with your health care provider.   Document Released: 10/13/2003 Document Revised: 08/13/2014 Document Reviewed: 09/25/2011 Elsevier Interactive Patient Education [employer].

## 2015-09-19 NOTE — Progress Notes (Signed)
Subjective:    Patient ID: Brandi Molina, female    DOB: March 25, 1966, 50 y.o.   MRN: [id number]  HPI [name], a 50 year female with a history of low back pain and aortic atherosclerosis presents to establish care. She states that she was previously followed by Dr. Gwenyth Bouillon, but failed to follow up due to lack of payer source. She states that she was aorticathlerosclerosis was found by incident on xray during an emergency room visit. She was evaluated by [facility] services greater than 1 year ago, where she was started on daily aspirin and statin therapy. Her family history is significant for heart disease and dyslipidemia. She maintains that she has not had cholesterol checked in greater than 1 year and is no longer on statin therapy. She also complains of chronic back pain. Previous back xrays show mild degenerative changes. Patient presents for presents evaluation of low back problems. She denies an inciting event, she says that she one day she was awakened with excruciating back pain and it has occurrent intermittently since that time. Current back pain is 4/10 in intensity described as burning and intermittent. She denies fever, fatigue, weakness, dysuria, or incontinence of urine or stool.  She also complains of anxiety. She states that she is constantly worried about back pain. She states that she is in fear that back pain is progressing. She also reports racing thoughts and fear. She currently denies suicidal or homicidal intent.  Past Medical History  Diagnosis Date  . Bartholin gland cyst 2008  . Depression    Past Surgical History  Procedure Laterality Date  . Cholecystectomy    . Cholecystectomy  1999   There is no immunization history on file for this patient. Social History   Social History  . Marital Status: Married    Spouse Name: N/A  . Number of Children: N/A  . Years of Education: N/A   Occupational History  . Not on file.   Social History Main Topics   . Smoking status: Current Every Day Smoker -- 1.00 packs/day for 20 years    Types: Cigarettes  . Smokeless tobacco: Not on file  . Alcohol Use: No  . Drug Use: No  . Sexual Activity: Yes   Other Topics Concern  . Not on file   Social History Narrative    Review of Systems  Constitutional: Negative for fever.  HENT: Negative.   Eyes: Negative.  Negative for photophobia, redness and visual disturbance.  Respiratory: Negative.   Cardiovascular: Negative.  Negative for chest pain and palpitations.  Gastrointestinal: Negative.   Endocrine: Negative.  Negative for polydipsia, polyphagia and polyuria.  Genitourinary: Negative.  Negative for dysuria, urgency and decreased urine volume.  Musculoskeletal: Positive for back pain and arthralgias. Negative for myalgias.  Skin: Negative.   Allergic/Immunologic: Negative.   Neurological: Negative.  Negative for dizziness and facial asymmetry.  Hematological: Negative.   Psychiatric/Behavioral: Negative for suicidal ideas. The patient is nervous/anxious.        Objective:   Physical Exam  Constitutional: She is oriented to person, place, and time. She appears well-developed and well-nourished.  HENT:  Head: Normocephalic and atraumatic.  Right Ear: External ear normal.  Left Ear: External ear normal.  Mouth/Throat: Oropharynx is clear and moist.  Eyes: Conjunctivae, EOM and lids are normal. Pupils are equal, round, and reactive to light.  Neck: Normal range of motion. Neck supple.  Cardiovascular: Normal rate, regular rhythm, normal heart sounds and intact distal pulses.  Pulmonary/Chest: Effort normal and breath sounds normal. No respiratory distress.  Abdominal: Soft. Bowel sounds are normal.  Musculoskeletal:       Lumbar back: She exhibits decreased range of motion, pain and spasm.  Neurological: She is alert and oriented to person, place, and time. She has normal reflexes.  Skin: Skin is warm.  Psychiatric: She has a normal  mood and affect. Her behavior is normal. Judgment and thought content normal.     BP 117/72 mmHg  Pulse 86  Temp(Src) 98.2 F (36.8 C) (Oral)  Resp 14  Ht 5\' 1"  (1.549 m)  Wt 129 lb (58.514 kg)  BMI 24.39 kg/m2 Assessment & Plan:   1. Aortoiliac occlusive disease ([place]) Reviewed previous x-ray, Aortoiliac atherosclerotic vascular disease. Will check lipid panel. If LDL is > 70, will restart low dose statin therapy. Goal is <70. Discussed the dangers of smoking at length.  - Lipid Panel; Future  2. Chronic back pain - DG Lumbar Spine Complete; Future - POCT urinalysis dipstick - Vitamin D, 25-hydroxy  3. Spondylolisthesis, grade 1 Reviewed previous images. Stable Grade 1 spondylolisthesis of L4-L5. Aortoilliac atheroosclerotic vascular disease.  - DG Lumbar Spine Complete; Future  4. Menopausal symptoms Patient reports that she has not had a menstrual cycle over the past several months.   5. Generalized anxiety disorder GAD 7 evaluation is 14, consistent with anxiety. Will start a trial of buspar 5 mg BID. Patient states that she was on Zoloft for anxiety in the past with unsatisfactory results. Recommend follow up with [facility] for counseling services M-F 8am-3pm  - busPIRone (BUSPAR) 5 MG tablet; Take 1 tablet (5 mg total) by mouth 2 (two) times daily.  Dispense: 60 tablet; Refill: 1  6. Leukocytosis Reviewed previous labs - CBC with Differential  7. Cigarette nicotine dependence without complication Smoking cessation instruction/counseling given:  counseled patient on the dangers of tobacco use, advised patient to stop smoking, and reviewed strategies to maximize success   Routine Health Maintenance Menopausal/ night/ covers off-menopausal Smoke: 1 pack per day for > 20 years recommend smoking cessation Recommend pap smear Recommend mammogram Patient refused vaccinations   The patient was given clear instructions to go to ER or return to medical  center if symptoms do not improve, worsen or new problems develop. The patient verbalized understanding. Will notify patient with laboratory results. Dorena Dew, FNP

## 2015-09-20 ENCOUNTER — Other Ambulatory Visit: Payer: Self-pay | Admitting: Family Medicine

## 2015-09-20 DIAGNOSIS — E559 Vitamin D deficiency, unspecified: Secondary | ICD-10-CM | POA: Insufficient documentation

## 2015-09-20 LAB — VITAMIN D 25 HYDROXY (VIT D DEFICIENCY, FRACTURES): VIT D 25 HYDROXY: 27 ng/mL — AB (ref 30–100)

## 2015-09-20 LAB — TSH: TSH: 0.72 mIU/L

## 2015-09-20 MED ORDER — VITAMIN D 50 MCG (2000 UT) PO CAPS: 1.0000 | ORAL_CAPSULE | Freq: Every day | ORAL | Status: DC

## 2015-09-22 ENCOUNTER — Other Ambulatory Visit (INDEPENDENT_AMBULATORY_CARE_PROVIDER_SITE_OTHER): Payer: BLUE CROSS/BLUE SHIELD

## 2015-09-22 DIAGNOSIS — I7409 Other arterial embolism and thrombosis of abdominal aorta: Secondary | ICD-10-CM | POA: Diagnosis not present

## 2015-09-22 LAB — LIPID PANEL
CHOL/HDL RATIO: 4.7 ratio (ref <=5.0)
Cholesterol: 194 mg/dL (ref 125–200)
HDL: 41 mg/dL — ABNORMAL LOW (ref >=46)
LDL Cholesterol: 120 mg/dL (ref <130)
Triglycerides: 166 mg/dL — ABNORMAL HIGH (ref <150)
VLDL: 33 mg/dL — AB (ref <30)

## 2015-09-25 ENCOUNTER — Other Ambulatory Visit: Payer: Self-pay | Admitting: Family Medicine

## 2015-09-25 DIAGNOSIS — I7409 Other arterial embolism and thrombosis of abdominal aorta: Secondary | ICD-10-CM

## 2015-09-25 MED ORDER — ATORVASTATIN CALCIUM 20 MG PO TABS: 20.0000 mg | ORAL_TABLET | Freq: Every day | ORAL | Status: DC

## 2015-09-25 NOTE — Progress Notes (Signed)
Meds ordered this encounter  Medications  . atorvastatin (LIPITOR) 20 MG tablet    Sig: Take 1 tablet (20 mg total) by mouth daily.    Dispense:  90 tablet    Refill:  1  Lanson Randle M, FNP

## 2015-09-26 MED FILL — ATORVASTATIN 20 MG TABLET: 20 | 30 days supply | Qty: 30 | Fill #0

## 2015-09-26 NOTE — Progress Notes (Signed)
Called and spoke with patient and advised of labs and to start lipitor 20mg  daily. Patient verbalized understanding and had no other questions at this time. Thanks!

## 2015-10-25 MED FILL — ?ATORVASTATIN 20 MG TABLET: 20 | 30 days supply | Qty: 30 | Fill #1

## 2015-10-31 ENCOUNTER — Encounter: Payer: Self-pay | Admitting: Family Medicine

## 2015-10-31 ENCOUNTER — Ambulatory Visit (INDEPENDENT_AMBULATORY_CARE_PROVIDER_SITE_OTHER): Payer: Self-pay | Admitting: Family Medicine

## 2015-10-31 VITALS — BP 110/79 | HR 93 | Temp 97.6°F | Resp 14 | Ht 61.0 in | Wt 127.0 lb

## 2015-10-31 DIAGNOSIS — F411 Generalized anxiety disorder: Secondary | ICD-10-CM

## 2015-10-31 DIAGNOSIS — F17210 Nicotine dependence, cigarettes, uncomplicated: Secondary | ICD-10-CM

## 2015-10-31 DIAGNOSIS — I7409 Other arterial embolism and thrombosis of abdominal aorta: Secondary | ICD-10-CM

## 2015-10-31 MED ORDER — SERTRALINE HCL 50 MG PO TABS: 50.0000 mg | ORAL_TABLET | Freq: Every day | ORAL | Status: DC

## 2015-10-31 MED FILL — ?SERTRALINE HCL 50 MG TABLE: 50 | 30 days supply | Qty: 30 | Fill #0

## 2015-10-31 NOTE — Progress Notes (Signed)
Subjective:    Patient ID: Brandi Molina, female    DOB: [birth year], 50 y.o.   MRN: [id number]  Anxiety Onset was 6 to 12 months ago (Patient was started on Buspar 1 month ago, but discontinued medication after 1 week due to daytime sleepiness. ). The problem has been gradually improving. Symptoms include nervous/anxious behavior and restlessness. Patient reports no chest pain, compulsions, confusion, decreased concentration, depressed mood, dizziness, dry mouth, hyperventilation, impotence, insomnia, irritability, malaise, muscle tension, nausea, obsessions, palpitations, shortness of breath or suicidal ideas. Symptoms occur constantly. The severity of symptoms is moderate. The symptoms are aggravated by family issues. The quality of sleep is fair. Nighttime awakenings: none.   Her past medical history is significant for depression. There is no history of bipolar disorder. Past treatments include SSRIs. The treatment provided no relief. Compliance with prior treatments has been good.   Past Medical History  Diagnosis Date  . Bartholin gland cyst 2008  . Depression    Past Surgical History  Procedure Laterality Date  . Cholecystectomy    . Cholecystectomy  1999   There is no immunization history on file for this patient. Social History   Social History  . Marital Status: Married    Spouse Name: N/A  . Number of Children: N/A  . Years of Education: N/A   Occupational History  . Not on file.   Social History Main Topics  . Smoking status: Current Every Day Smoker -- 1.00 packs/day for 20 years    Types: Cigarettes  . Smokeless tobacco: Not on file  . Alcohol Use: No  . Drug Use: No  . Sexual Activity: Yes   Other Topics Concern  . Not on file   Social History Narrative    Review of Systems  Constitutional: Negative for fever and irritability.  HENT: Negative.   Eyes: Negative.  Negative for photophobia, redness and visual disturbance.  Respiratory: Negative.   Negative for shortness of breath.   Cardiovascular: Negative.  Negative for chest pain and palpitations.  Gastrointestinal: Negative.  Negative for nausea.  Endocrine: Negative.  Negative for polydipsia, polyphagia and polyuria.  Genitourinary: Negative.  Negative for dysuria, urgency, impotence and decreased urine volume.  Musculoskeletal: Positive for back pain and arthralgias. Negative for myalgias.  Skin: Negative.   Allergic/Immunologic: Negative.   Neurological: Negative.  Negative for dizziness and facial asymmetry.  Hematological: Negative.   Psychiatric/Behavioral: Negative for suicidal ideas, confusion and decreased concentration. The patient is nervous/anxious. The patient does not have insomnia.        Objective:   Physical Exam  Constitutional: She is oriented to person, place, and time. She appears well-developed and well-nourished.  HENT:  Head: Normocephalic and atraumatic.  Right Ear: External ear normal.  Left Ear: External ear normal.  Mouth/Throat: Oropharynx is clear and moist.  Eyes: Conjunctivae, EOM and lids are normal. Pupils are equal, round, and reactive to light.  Neck: Normal range of motion. Neck supple.  Cardiovascular: Normal rate, regular rhythm, normal heart sounds and intact distal pulses.   Pulmonary/Chest: Effort normal and breath sounds normal. No respiratory distress.  Abdominal: Soft. Bowel sounds are normal.  Neurological: She is alert and oriented to person, place, and time. She has normal reflexes.  Skin: Skin is warm.  Psychiatric: Her behavior is normal. Judgment and thought content normal. Her mood appears anxious.     BP 110/79 mmHg  Pulse 93  Temp(Src) 97.6 F (36.4 C) (Oral)  Resp 14  Ht 5\' 1"  (  1.549 m)  Wt 127 lb (57.607 kg)  BMI 24.01 kg/m2 Assessment & Plan:   1. Generalized anxiety disorder GAD 7 evaluation is 14, consistent with anxiety. Will start a trial of buspar 5 mg BID. Patient states that she was on Zoloft for  anxiety in the past with unsatisfactory results. Recommend follow up with [facility] for counseling services M-F 8am-3pm - sertraline (ZOLOFT) 50 MG tablet; Take 1 tablet (50 mg total) by mouth daily.  Dispense: 30 tablet; Refill: 3  2. Cigarette nicotine dependence without complication  Smoking cessation instruction/counseling given:  counseled patient on the dangers of tobacco use, advised patient to stop smoking, and reviewed strategies to maximize success   Routine Health Maintenance Menopausal/ night/ covers off-menopausal Smoke: 1 pack per day for > 20 years recommend smoking cessation Recommend pap smear Recommend mammogram Patient refused vaccinations  RTC: Will follow up by phone in 6 weeks. Will also follow up for a fasting cholesterol panel in 2 months.    The patient was given clear instructions to go to ER or return to medical center if symptoms do not improve, worsen or new problems develop. The patient verbalized understanding. Will notify patient with laboratory results. Dorena Dew, FNP

## 2015-10-31 NOTE — Patient Instructions (Addendum)
Will follow up by phone in 6 weeks to discuss anxiety.   Generalized Anxiety Disorder Generalized anxiety disorder (GAD) is a mental disorder. It interferes with life functions, including relationships, work, and school. GAD is different from normal anxiety, which everyone experiences at some point in their lives in response to specific life events and activities. Normal anxiety actually helps [place] prepare for and get through these life events and activities. Normal anxiety goes away after the event or activity is over.  GAD causes anxiety that is not necessarily related to specific events or activities. It also causes excess anxiety in proportion to specific events or activities. The anxiety associated with GAD is also difficult to control. GAD can vary from mild to severe. People with severe GAD can have intense waves of anxiety with physical symptoms (panic attacks).  SYMPTOMS The anxiety and worry associated with GAD are difficult to control. This anxiety and worry are related to many life events and activities and also occur more days than not for 6 months or longer. People with GAD also have three or more of the following symptoms (one or more in children):  Restlessness.   Fatigue.  Difficulty concentrating.   Irritability.  Muscle tension.  Difficulty sleeping or unsatisfying sleep. DIAGNOSIS GAD is diagnosed through an assessment by your health care provider. Your health care provider will ask you questions aboutyour mood,physical symptoms, and events in your life. Your health care provider may ask you about your medical history and use of alcohol or drugs, including prescription medicines. Your health care provider may also do a physical exam and blood tests. Certain medical conditions and the use of certain substances can cause symptoms similar to those associated with GAD. Your health care provider may refer you to a mental health specialist for further evaluation. TREATMENT The  following therapies are usually used to treat GAD:   Medication. Antidepressant medication usually is prescribed for long-term daily control. Antianxiety medicines may be added in severe cases, especially when panic attacks occur.   Talk therapy (psychotherapy). Certain types of talk therapy can be helpful in treating GAD by providing support, education, and guidance. A form of talk therapy called cognitive behavioral therapy can teach you healthy ways to think about and react to daily life events and activities.  Stress managementtechniques. These include yoga, meditation, and exercise and can be very helpful when they are practiced regularly. A mental health specialist can help determine which treatment is best for you. Some people see improvement with one therapy. However, other people require a combination of therapies.   This information is not intended to replace advice given to you by your health care provider. Make sure you discuss any questions you have with your health care provider.   Document Released: 11/17/2012 Document Revised: 08/13/2014 Document Reviewed: 11/17/2012 Elsevier Interactive Patient Education [employer].

## 2015-11-22 MED FILL — ?ATORVASTATIN 20 MG TABLET: 20 | 30 days supply | Qty: 30 | Fill #2

## 2015-11-30 MED FILL — SERTRALINE HCL 50 MG TABLET: 50 | 30 days supply | Qty: 30 | Fill #1

## 2015-12-20 MED FILL — ATORVASTATIN 20 MG TABLET: 20 | 30 days supply | Qty: 30 | Fill #3

## 2015-12-28 MED FILL — SERTRALINE HCL 50 MG TABLET: 50 | 30 days supply | Qty: 30 | Fill #2

## 2015-12-30 ENCOUNTER — Other Ambulatory Visit (INDEPENDENT_AMBULATORY_CARE_PROVIDER_SITE_OTHER): Payer: Self-pay

## 2015-12-30 DIAGNOSIS — E785 Hyperlipidemia, unspecified: Secondary | ICD-10-CM

## 2015-12-30 LAB — LIPID PANEL
Cholesterol: 127 mg/dL (ref 125–200)
HDL: 43 mg/dL — ABNORMAL LOW (ref >=46)
LDL CALC: 55 mg/dL (ref <130)
TRIGLYCERIDES: 147 mg/dL (ref <150)
Total CHOL/HDL Ratio: 3 Ratio (ref <=5.0)
VLDL: 29 mg/dL (ref <30)

## 2016-01-14 ENCOUNTER — Encounter (HOSPITAL_COMMUNITY): Payer: Self-pay | Admitting: Emergency Medicine

## 2016-01-14 ENCOUNTER — Ambulatory Visit (HOSPITAL_COMMUNITY)
Admission: EM | Admit: 2016-01-14 | Discharge: 2016-01-14 | Disposition: A | Discharge status: Home / Self Care | Payer: Self-pay | Attending: Emergency Medicine | Admitting: Emergency Medicine

## 2016-01-14 DIAGNOSIS — L237 Allergic contact dermatitis due to plants, except food: Secondary | ICD-10-CM

## 2016-01-14 MED ORDER — METHYLPREDNISOLONE ACETATE 80 MG/ML IJ SUSP
INTRAMUSCULAR | Status: AC
  Filled 2016-01-14: qty 1

## 2016-01-14 MED ORDER — METHYLPREDNISOLONE ACETATE 80 MG/ML IJ SUSP
80.0000 mg | Freq: Once | INTRAMUSCULAR | Status: AC
  Administered 2016-01-14: 80 mg via INTRAMUSCULAR

## 2016-01-14 NOTE — ED Notes (Signed)
Here for poss poison oak exposure onset x2 days... Reports rash on right arm, abd, and face... States she was working in her yard... A&O x4... No acute distress.

## 2016-01-14 NOTE — Discharge Instructions (Signed)

## 2016-01-14 NOTE — ED Provider Notes (Signed)
CSN: [id number]     Arrival date & time 01/14/16  1804 History   First MD Initiated Contact with Patient 01/14/16 1820     Chief Complaint  Patient presents with  . Poison Oak    Patient is a 50 y.o. female presenting with rash. The history is provided by the patient.  Rash Location:  Shoulder/arm, hand and torso Shoulder/arm rash location:  L forearm, R forearm, R hand and L hand Torso rash location:  Abd RLQ Quality: dryness, itchiness, redness and swelling   Quality: not blistering, not bruising, not burning, not draining, not painful, not peeling, not scaling and not weeping   Severity:  Moderate Onset quality:  Gradual Duration:  2 days Timing:  Constant Progression:  Worsening Chronicity:  Recurrent Context: plant contact   Context: not animal contact, not chemical exposure, not diapers, not eggs, not exposure to similar rash, not food, not hot tub use, not insect bite/sting, not medications, not new detergent/soap, not nuts, not pollen, not pregnancy, not sick contacts and not sun exposure   Ineffective treatments:  Anti-itch cream Associated symptoms: no fever, no induration, no joint pain, no myalgias and not wheezing   Onset of sx's noted after working in her yard several days ago.   Past Medical History  Diagnosis Date  . Bartholin gland cyst 2008  . Depression    Past Surgical History  Procedure Laterality Date  . Cholecystectomy    . Cholecystectomy  1999   Family History  Problem Relation Age of Onset  . Anesthesia problems Neg Hx   . Hypotension Neg Hx   . Malignant hyperthermia Neg Hx   . Pseudochol deficiency Neg Hx   . Diabetes Mother   . Heart disease Mother   . Heart attack Mother   . AAA (abdominal aortic aneurysm) Mother   . Cancer Father   . Hypertension Brother    Social History  Substance Use Topics  . Smoking status: Current Every Day Smoker -- 1.00 packs/day for 20 years    Types: Cigarettes  . Smokeless tobacco: None  . Alcohol Use: No    OB History    Gravida Para Term Preterm AB TAB SAB Ectopic Multiple Living   2 1 1  1 1    1      Review of Systems  Constitutional: Negative for fever.  Respiratory: Negative for wheezing.   Musculoskeletal: Negative for myalgias and arthralgias.  Skin: Positive for rash.  All other systems reviewed and are negative.   Allergies  Review of patient's allergies indicates no known allergies.  Home Medications   Prior to Admission medications   Medication Sig Start Date End Date Taking? Authorizing Provider  atorvastatin (LIPITOR) 20 MG tablet Take 1 tablet (20 mg total) by mouth daily. 09/25/15  Yes Dorena Dew, FNP  sertraline (ZOLOFT) 50 MG tablet Take 1 tablet (50 mg total) by mouth daily. 10/31/15  Yes Dorena Dew, FNP  Cholecalciferol (VITAMIN D) 2000 units CAPS Take 1 capsule (2,000 Units total) by mouth daily. 09/20/15   Dorena Dew, FNP  naproxen (NAPROSYN) 500 MG tablet Take 1 tablet (500 mg total) by mouth 2 (two) times daily. 08/27/15   Tatyana Kirichenko, PA-C  naproxen sodium (ANAPROX) 550 MG tablet Reported on 10/31/2015 07/09/14   Historical Provider, MD   Meds Ordered and Administered this Visit   Medications  methylPREDNISolone acetate (DEPO-MEDROL) injection 80 mg (80 mg Intramuscular Given 01/14/16 1833)    BP 124/73 mmHg  Pulse  87  Temp(Src) 98.3 F (36.8 C) (Oral)  Resp 12  SpO2 96% No data found.   Physical Exam  Constitutional: She appears well-developed and well-nourished.  HENT:  Head: Normocephalic and atraumatic.  Eyes: Conjunctivae are normal.  Cardiovascular: Normal rate.   Pulmonary/Chest: Effort normal.  Neurological: She is alert.  Skin: Skin is warm and dry. Rash noted. Rash is papular. There is erythema.  Dried, erythematous papular rash to bil forearms and a small erythematous, circular area to RLQ abd. Non-draining.   Psychiatric: She has a normal mood and affect.    ED Course  Procedures (including critical care  time)  Labs Review Labs Reviewed - No data to display  Imaging Review No results found.   Visual Acuity Review  Right Eye Distance:   Left Eye Distance:   Bilateral Distance:    Right Eye Near:   Left Eye Near:    Bilateral Near:         MDM   1. Contact dermatitis due to poison oak   HPI and PE c/w above. Pt requested "steroid shot".  Depo-Medrol 80 mg IM Home care and reasons for f/u provided in print.    Jeryl Columbia, NP 01/14/16 1835

## 2016-01-20 MED FILL — ?ATORVASTATIN 20 MG TABLET: 20 | 30 days supply | Qty: 30 | Fill #4

## 2016-02-01 ENCOUNTER — Ambulatory Visit (HOSPITAL_COMMUNITY)
Admission: RE | Admit: 2016-02-01 | Discharge: 2016-02-01 | Disposition: A | Discharge status: Home / Self Care | Payer: Self-pay | Source: Ambulatory Visit | Attending: Family Medicine | Admitting: Family Medicine

## 2016-02-01 ENCOUNTER — Other Ambulatory Visit: Payer: Self-pay

## 2016-02-01 ENCOUNTER — Ambulatory Visit (INDEPENDENT_AMBULATORY_CARE_PROVIDER_SITE_OTHER): Payer: Self-pay | Admitting: Family Medicine

## 2016-02-01 ENCOUNTER — Encounter: Payer: Self-pay | Admitting: Family Medicine

## 2016-02-01 VITALS — BP 119/71 | HR 81 | Temp 98.1°F | Resp 16 | Ht 61.0 in | Wt 124.0 lb

## 2016-02-01 DIAGNOSIS — M549 Dorsalgia, unspecified: Secondary | ICD-10-CM

## 2016-02-01 DIAGNOSIS — R0600 Dyspnea, unspecified: Secondary | ICD-10-CM

## 2016-02-01 DIAGNOSIS — F17200 Nicotine dependence, unspecified, uncomplicated: Secondary | ICD-10-CM

## 2016-02-01 DIAGNOSIS — M546 Pain in thoracic spine: Secondary | ICD-10-CM

## 2016-02-01 MED ORDER — NAPROXEN 500 MG PO TABS: 500.0000 mg | ORAL_TABLET | Freq: Two times a day (BID) | ORAL | Status: DC

## 2016-02-01 MED ORDER — KETOROLAC TROMETHAMINE 60 MG/2ML IM SOLN
30.0000 mg | Freq: Once | INTRAMUSCULAR | Status: AC
  Administered 2016-02-01: 30 mg via INTRAMUSCULAR

## 2016-02-01 MED FILL — NAPROXEN 500 MG TABLET: 500 | 15 days supply | Qty: 30 | Fill #0

## 2016-02-01 MED FILL — SERTRALINE HCL 50 MG TABLET: 50 | 30 days supply | Qty: 30 | Fill #3

## 2016-02-01 NOTE — Telephone Encounter (Signed)
Sent rx for naproxen in via escribe. Thanks!

## 2016-02-01 NOTE — Progress Notes (Signed)
Subjective:    Patient ID: Brandi Molina, female    DOB: [birth year], 50 y.o.   MRN: [id number]  Back Pain The current episode started more than 1 month ago. The problem occurs constantly. The pain is present in the thoracic spine. The quality of the pain is described as aching. The pain is at a severity of 3/10. The pain is mild. The pain is worse during the night. The symptoms are aggravated by coughing and sitting. Pertinent negatives include no bladder incontinence, bowel incontinence, chest pain, dysuria, fever, headaches, leg pain, numbness, paresis, paresthesias, pelvic pain, perianal numbness, tingling, weakness or weight loss. Risk factors include sedentary lifestyle. She has tried NSAIDs for the symptoms. The treatment provided mild relief.  Shortness of Breath This is a new problem. The current episode started in the past 7 days. The problem occurs intermittently. The problem has been resolved. Pertinent negatives include no chest pain, fever, headaches or leg pain. Nothing aggravates the symptoms. Risk factors include smoking. She has tried nothing for the symptoms. There is no history of allergies, aspirin allergies, asthma, bronchiolitis, CAD, chronic lung disease, COPD, DVT, a heart failure, PE, pneumonia or a recent surgery.   Past Medical History  Diagnosis Date  . Bartholin gland cyst 2008  . Depression    Past Surgical History  Procedure Laterality Date  . Cholecystectomy    . Cholecystectomy  1999   There is no immunization history on file for this patient. Social History   Social History  . Marital Status: Married    Spouse Name: N/A  . Number of Children: N/A  . Years of Education: N/A   Occupational History  . Not on file.   Social History Main Topics  . Smoking status: Current Every Day Smoker -- 1.00 packs/day for 20 years    Types: Cigarettes  . Smokeless tobacco: Not on file  . Alcohol Use: No  . Drug Use: No  . Sexual Activity: Yes   Other Topics  Concern  . Not on file   Social History Narrative    Review of Systems  Constitutional: Negative for fever and weight loss.  HENT: Negative.   Eyes: Negative.  Negative for photophobia, redness and visual disturbance.  Respiratory: Positive for shortness of breath.   Cardiovascular: Negative.  Negative for chest pain and palpitations.  Gastrointestinal: Negative.  Negative for bowel incontinence.  Endocrine: Negative.  Negative for polydipsia, polyphagia and polyuria.  Genitourinary: Negative.  Negative for bladder incontinence, dysuria, urgency, decreased urine volume and pelvic pain.  Musculoskeletal: Positive for back pain and arthralgias. Negative for myalgias.  Skin: Negative.   Allergic/Immunologic: Negative.   Neurological: Negative.  Negative for dizziness, tingling, facial asymmetry, weakness, numbness, headaches and paresthesias.  Hematological: Negative.   Psychiatric/Behavioral: Negative for suicidal ideas. The patient is nervous/anxious.        Objective:   Physical Exam  Constitutional: She is oriented to person, place, and time. She appears well-developed and well-nourished.  HENT:  Head: Normocephalic and atraumatic.  Right Ear: External ear normal.  Left Ear: External ear normal.  Mouth/Throat: Oropharynx is clear and moist.  Eyes: Conjunctivae, EOM and lids are normal. Pupils are equal, round, and reactive to light.  Neck: Normal range of motion. Neck supple.  Cardiovascular: Normal rate, regular rhythm, normal heart sounds and intact distal pulses.   Pulmonary/Chest: Effort normal and breath sounds normal. No respiratory distress.  Abdominal: Soft. Bowel sounds are normal.  Musculoskeletal:  Lumbar back: She exhibits decreased range of motion, pain and spasm.  Neurological: She is alert and oriented to person, place, and time. She has normal reflexes.  Skin: Skin is warm.  Psychiatric: She has a normal mood and affect. Her behavior is normal. Judgment  and thought content normal.     BP 119/71 mmHg  Pulse 81  Temp(Src) 98.1 F (36.7 C) (Oral)  Resp 16  Ht 5\' 1"  (1.549 m)  Wt 124 lb (56.246 kg)  BMI 23.44 kg/m2  SpO2 100% Assessment & Plan:   1. Upper back pain on left side Will review chest xray and follow up by phone with results - ketorolac (TORADOL) injection 30 mg; Inject 1 mL (30 mg total) into the muscle once. - naproxen (NAPROSYN) 500 MG tablet; Take 1 tablet (500 mg total) by mouth 2 (two) times daily.  Dispense: 30 tablet; Refill: 0  2. Dyspnea - DG Chest 2 View; Future  3. Tobacco dependence Smoking cessation instruction/counseling given:  counseled patient on the dangers of tobacco use, advised patient to stop smoking, and reviewed strategies to maximize success    Routine Health Maintenance Menopausal/ night/ covers off-menopausal Smoke: 1 pack per day for > 20 years recommend smoking cessation Recommend pap smear Recommend mammogram Patient refused vaccinations   The patient was given clear instructions to go to ER or return to medical center if symptoms do not improve, worsen or new problems develop. The patient verbalized understanding. Will notify patient with laboratory results. Dorena Dew, FNP

## 2016-02-01 NOTE — Patient Instructions (Signed)

## 2016-02-17 ENCOUNTER — Ambulatory Visit: Payer: Self-pay

## 2016-02-17 MED FILL — ?ATORVASTATIN 20 MG TABLET: 20 | 30 days supply | Qty: 30 | Fill #5

## 2016-02-22 ENCOUNTER — Ambulatory Visit: Payer: Self-pay | Attending: Internal Medicine

## 2016-03-20 ENCOUNTER — Other Ambulatory Visit: Payer: Self-pay | Admitting: Family Medicine

## 2016-03-20 DIAGNOSIS — F411 Generalized anxiety disorder: Secondary | ICD-10-CM

## 2016-03-20 DIAGNOSIS — I7409 Other arterial embolism and thrombosis of abdominal aorta: Secondary | ICD-10-CM

## 2016-03-20 MED FILL — ?ATORVASTATIN 20 MG TABLET: 20 | 30 days supply | Qty: 30 | Fill #0

## 2016-03-20 MED FILL — ?SERTRALINE HCL 50 MG TABLE: 50 | 30 days supply | Qty: 30 | Fill #0

## 2016-04-19 MED FILL — ?ATORVASTATIN 20 MG TABLET: 20 | 30 days supply | Qty: 30 | Fill #1

## 2016-04-19 MED FILL — ?SERTRALINE HCL 50 MG TABLE: 50 | 30 days supply | Qty: 30 | Fill #1

## 2016-05-24 MED FILL — ?SERTRALINE HCL 50 MG TABLE: 50 | 30 days supply | Qty: 30 | Fill #2

## 2016-05-24 MED FILL — ?ATORVASTATIN 20 MG TABLET: 20 | 30 days supply | Qty: 30 | Fill #2

## 2016-06-25 MED FILL — ATORVASTATIN 20 MG TABLET: 20 | 30 days supply | Qty: 30 | Fill #3

## 2016-06-25 MED FILL — ?SERTRALINE HCL 50 MG TABLE: 50 | 30 days supply | Qty: 30 | Fill #3

## 2016-07-15 IMAGING — DX DG LUMBAR SPINE COMPLETE 4+V
5 series · 5 of 5 positions shown · non-contrast
Comparison: July 12, 2014

CLINICAL DATA: Chronic lower back pain after fall 2 years ago.

EXAM:
LUMBAR SPINE - COMPLETE 4+ VIEW

[l-spine ap]
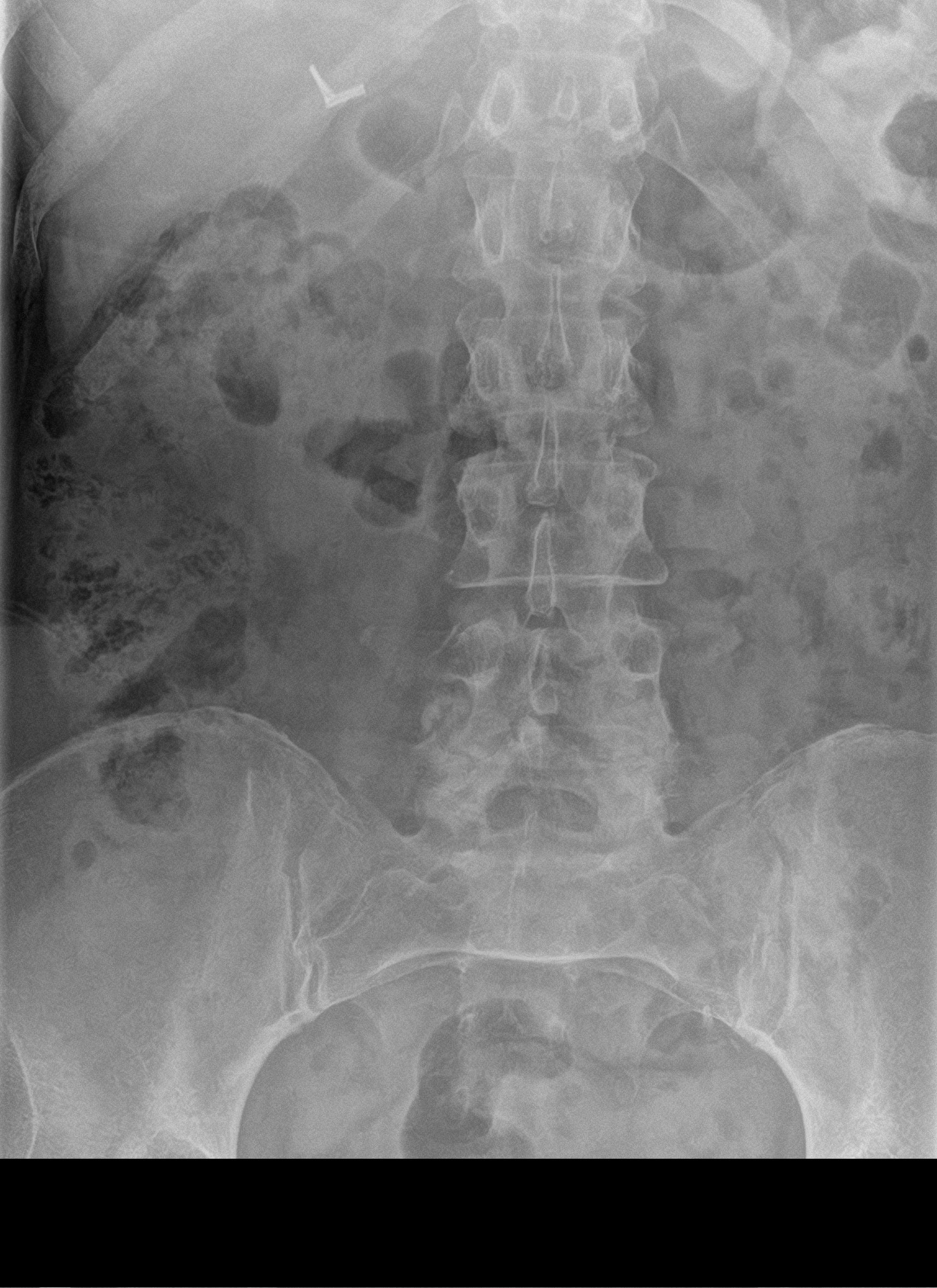

[l-spine obl (1 of 2)]
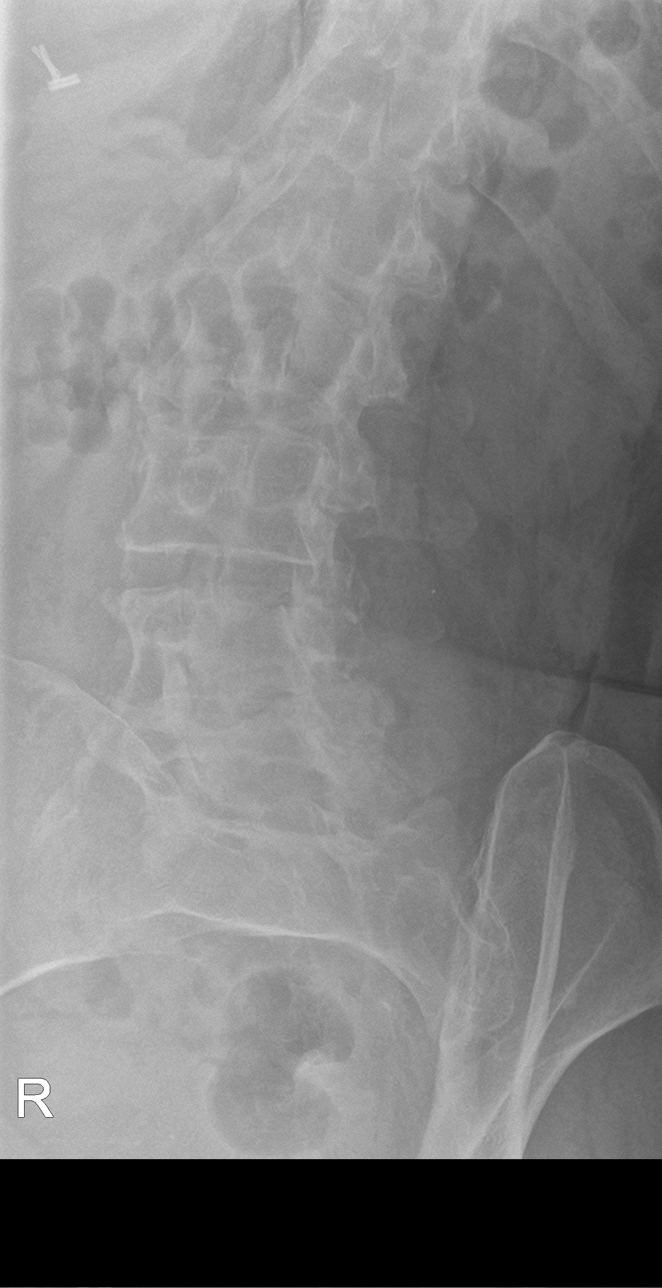

[l-spine obl (2 of 2)]
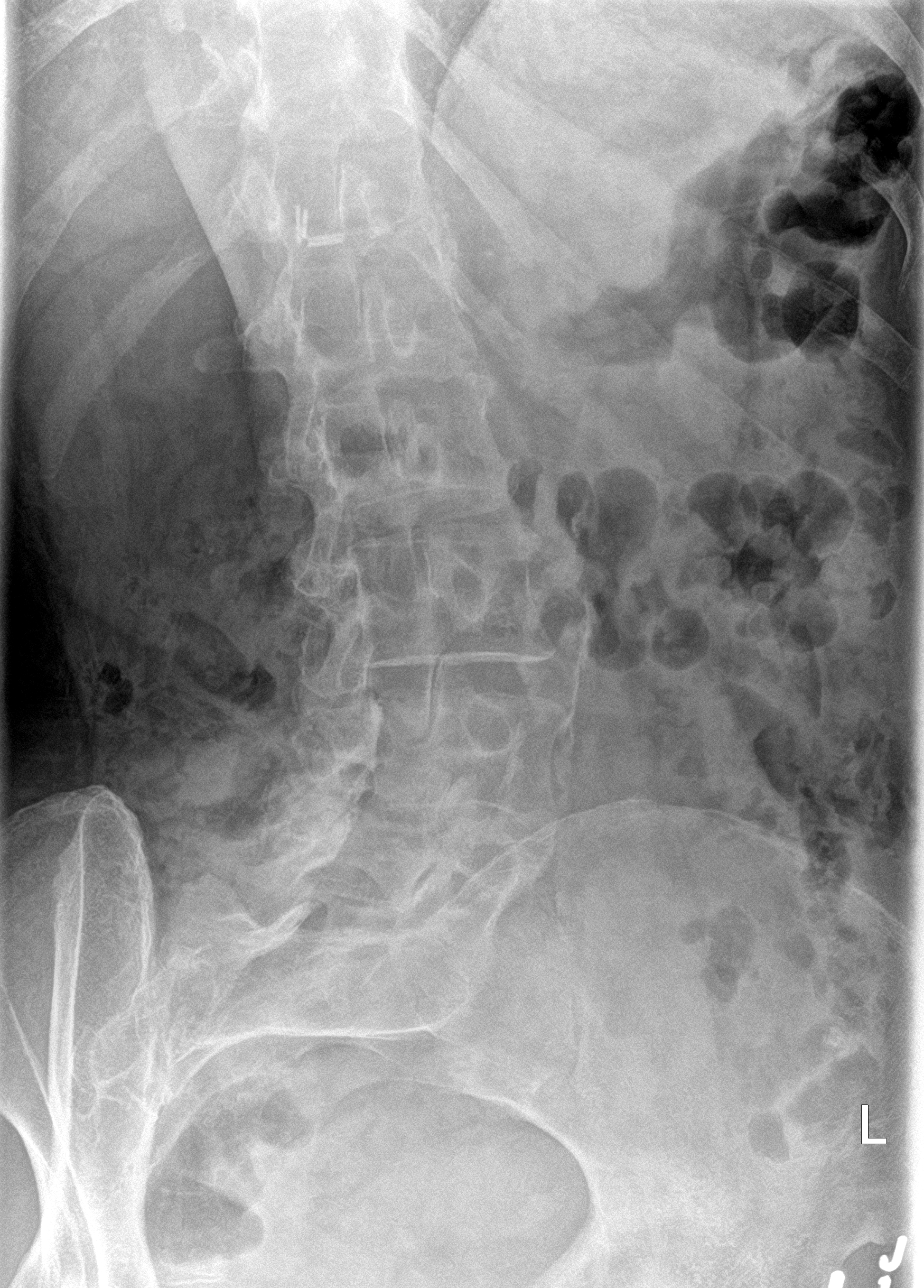

[l-spine lat]
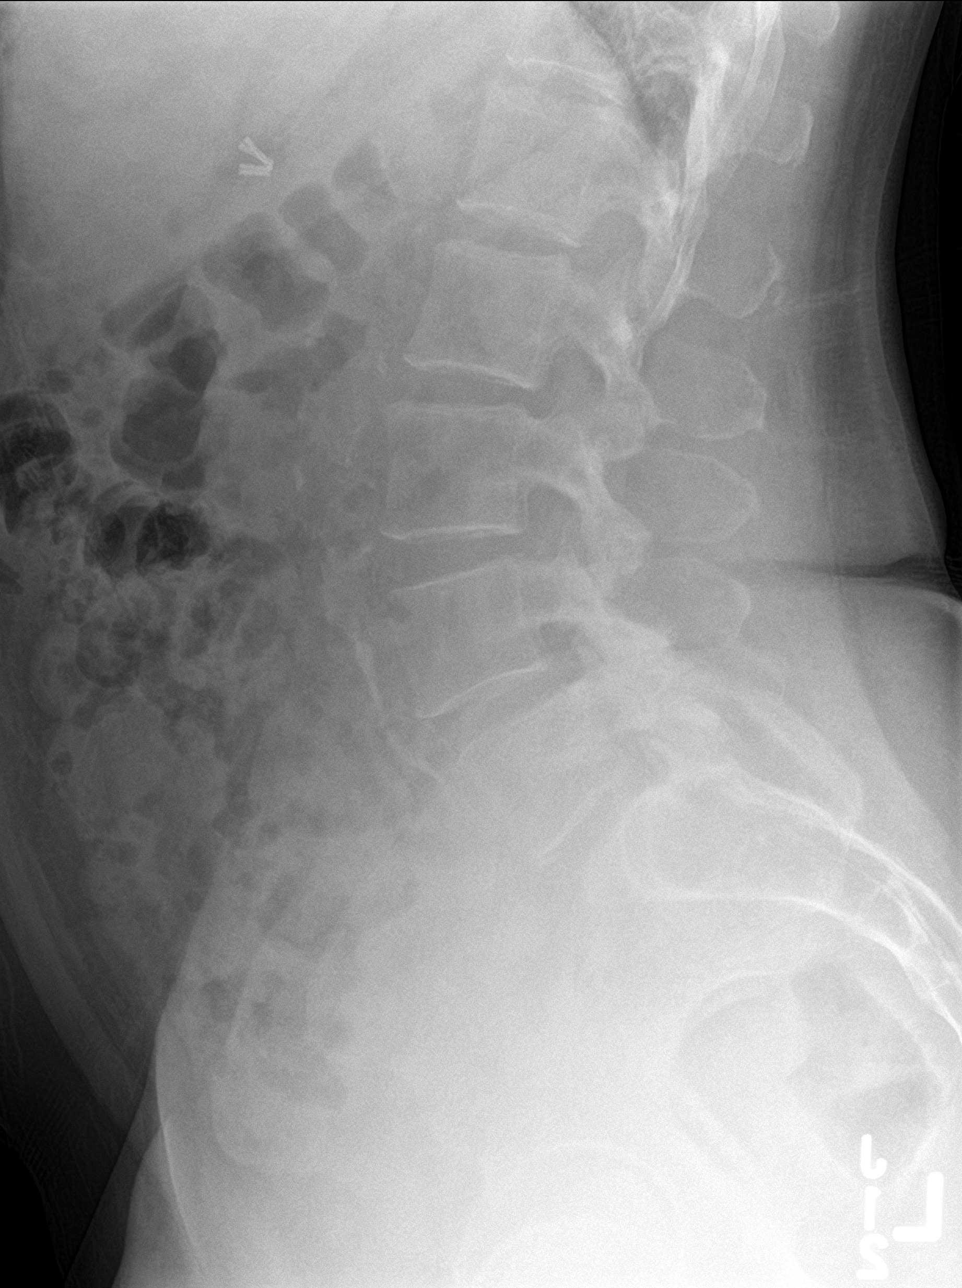

[l-spine spot]
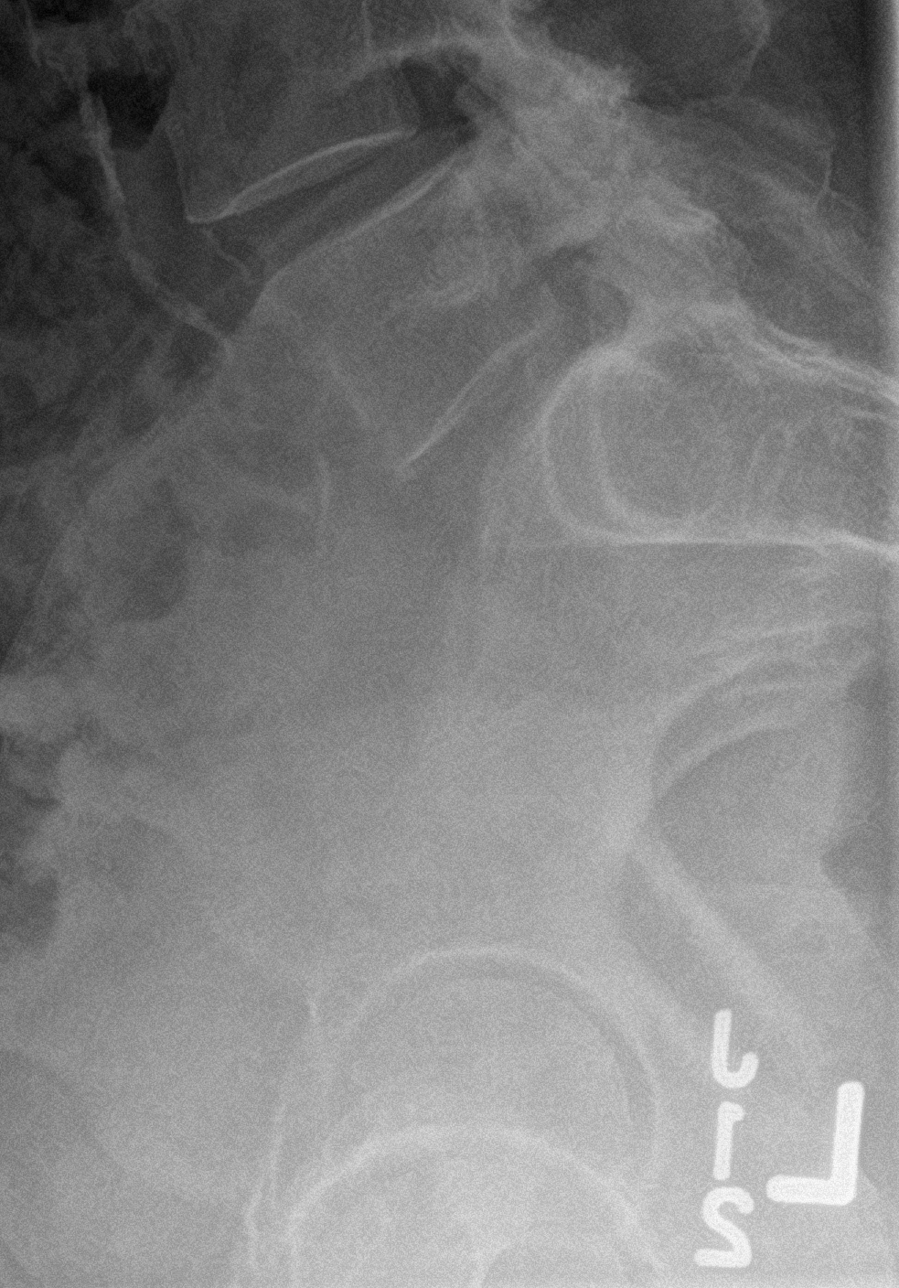

[5 of 5 positions shown; findings below may reference images not displayed]

FINDINGS: No fracture is noted. Minimal grade 1 anterolisthesis of L4-5 is
noted secondary to posterior facet joint hypertrophy. Disc spaces
are well-maintained. Atherosclerosis of abdominal aorta is noted.
IMPRESSION: Degenerative joint disease of posterior facet joints of L4-5 is
noted. No acute abnormality seen in the lumbar spine.

## 2016-07-25 ENCOUNTER — Other Ambulatory Visit: Payer: Self-pay | Admitting: Family Medicine

## 2016-07-25 DIAGNOSIS — F411 Generalized anxiety disorder: Secondary | ICD-10-CM

## 2016-07-25 MED FILL — ?ATORVASTATIN 20 MG TABLET: 20 | 30 days supply | Qty: 30 | Fill #4

## 2016-07-26 ENCOUNTER — Other Ambulatory Visit: Payer: Self-pay | Admitting: Family Medicine

## 2016-07-26 DIAGNOSIS — F411 Generalized anxiety disorder: Secondary | ICD-10-CM

## 2016-08-28 MED FILL — ATORVASTATIN 20 MG TABLET: 20 | 30 days supply | Qty: 30 | Fill #5

## 2016-09-26 ENCOUNTER — Other Ambulatory Visit: Payer: Self-pay | Admitting: Family Medicine

## 2016-09-26 DIAGNOSIS — I7409 Other arterial embolism and thrombosis of abdominal aorta: Secondary | ICD-10-CM

## 2016-09-27 MED FILL — ATORVASTATIN 20 MG TABLET: 20 | 30 days supply | Qty: 30 | Fill #0

## 2016-10-01 ENCOUNTER — Encounter: Payer: Self-pay | Admitting: Family Medicine

## 2016-10-01 ENCOUNTER — Ambulatory Visit (INDEPENDENT_AMBULATORY_CARE_PROVIDER_SITE_OTHER): Payer: Self-pay | Admitting: Family Medicine

## 2016-10-01 VITALS — BP 120/54 | HR 98 | Temp 98.1°F | Resp 18 | Ht 61.0 in | Wt 132.0 lb

## 2016-10-01 DIAGNOSIS — H65111 Acute and subacute allergic otitis media (mucoid) (sanguinous) (serous), right ear: Secondary | ICD-10-CM

## 2016-10-01 DIAGNOSIS — M542 Cervicalgia: Secondary | ICD-10-CM

## 2016-10-01 DIAGNOSIS — F411 Generalized anxiety disorder: Secondary | ICD-10-CM

## 2016-10-01 MED ORDER — SERTRALINE HCL 50 MG PO TABS: 50.0000 mg | ORAL_TABLET | Freq: Every day | ORAL | 5 refills | Status: DC

## 2016-10-01 MED ORDER — AMOXICILLIN-POT CLAVULANATE 875-125 MG PO TABS: 1.0000 | ORAL_TABLET | Freq: Two times a day (BID) | ORAL | 0 refills | Status: DC

## 2016-10-01 MED ORDER — ACETAMINOPHEN 500 MG PO TABS: 500.0000 mg | ORAL_TABLET | Freq: Four times a day (QID) | ORAL | 0 refills | Status: AC | PRN

## 2016-10-01 MED FILL — SERTRALINE HCL 50 MG TABLET: 50 | 30 days supply | Qty: 30 | Fill #0

## 2016-10-01 MED FILL — AMOX-CLAV 875-125 MG TABLET: 875-125 | 10 days supply | Qty: 20 | Fill #0

## 2016-10-01 NOTE — Patient Instructions (Addendum)
Otitis Media, Adult Otitis media is redness, soreness, and puffiness (swelling) in the space just behind your eardrum (middle ear). It may be caused by allergies or infection. It often happens along with a cold. Follow these instructions at home:  Take your medicine as told. Finish it even if you start to feel better.  Only take over-the-counter or prescription medicines for pain, discomfort, or fever as told by your doctor.  Follow up with your doctor as told. Contact a doctor if:  You have otitis media only in one ear, or bleeding from your nose, or both.  You notice a lump on your neck.  You are not getting better in 3-5 days.  You feel worse instead of better. Get help right away if:  You have pain that is not helped with medicine.  You have puffiness, redness, or pain around your ear.  You get a stiff neck.  You cannot move part of your face (paralysis).  You notice that the bone behind your ear hurts when you touch it. This information is not intended to replace advice given to you by your health care provider. Make sure you discuss any questions you have with your health care provider. Document Released: 01/09/2008 Document Revised: 12/29/2015 Document Reviewed: 02/17/2013 Elsevier Interactive Patient Education  2017 [employer]  

## 2016-10-01 NOTE — Progress Notes (Signed)
Subjective:    Patient ID: Brandi Molina, female    DOB: 06/15/1966, 51 y.o.   MRN: [id number]  Sore Throat   The pain is worse on the right side. The maximum temperature recorded prior to her arrival was 100.4 - 100.9 F. The pain is at a severity of 3/10. The pain is mild. Associated symptoms include ear pain, swollen glands and trouble swallowing. Pertinent negatives include no abdominal pain, congestion, coughing, diarrhea, drooling, headaches, neck pain, shortness of breath, stridor or vomiting. She has had no exposure to strep or mono. She has tried nothing for the symptoms. The treatment provided no relief.  Otalgia   There is pain in the right ear. This is a new problem. The current episode started 1 to 4 weeks ago. The problem occurs constantly. The problem has been unchanged. The pain is at a severity of 3/10. The pain is mild. Associated symptoms include a sore throat. Pertinent negatives include no abdominal pain, coughing, diarrhea, headaches, hearing loss, neck pain, rhinorrhea or vomiting. The treatment provided no relief. There is no history of a chronic ear infection, hearing loss or a tympanostomy tube.    Past Medical History:  Diagnosis Date  . Bartholin gland cyst 2008  . Depression    Social History   Social History  . Marital status: Married    Spouse name: N/A  . Number of children: N/A  . Years of education: N/A   Occupational History  . Not on file.   Social History Main Topics  . Smoking status: Current Every Day Smoker    Packs/day: 1.00    Years: 20.00    Types: Cigarettes  . Smokeless tobacco: Never Used  . Alcohol use No  . Drug use: No  . Sexual activity: Yes   Other Topics Concern  . Not on file   Social History Narrative  . No narrative on file   Review of Systems  Constitutional: Positive for fatigue.  HENT: Positive for ear pain, sore throat and trouble swallowing. Negative for congestion, drooling, hearing loss and rhinorrhea.    Eyes: Negative.   Respiratory: Negative.  Negative for cough, shortness of breath and stridor.   Cardiovascular: Negative.   Gastrointestinal: Negative.  Negative for abdominal pain, diarrhea and vomiting.  Endocrine: Negative.   Genitourinary: Negative.   Musculoskeletal: Negative.  Negative for neck pain.  Skin: Negative.   Allergic/Immunologic: Negative.   Neurological: Negative.  Negative for headaches.  Hematological: Negative.   Psychiatric/Behavioral: Negative.        Objective:   Physical Exam  Constitutional: She is oriented to person, place, and time.  HENT:  Right Ear: There is tenderness. Tympanic membrane is erythematous.  Left Ear: Hearing, tympanic membrane, external ear and ear canal normal.  Mouth/Throat: Uvula is midline. Posterior oropharyngeal erythema present.  Eyes: Conjunctivae and EOM are normal. Pupils are equal, round, and reactive to light.  Neck: Normal range of motion.  Pulmonary/Chest: Effort normal and breath sounds normal.  Abdominal: Soft. Bowel sounds are normal.  Musculoskeletal: Normal range of motion.  Neurological: She is alert and oriented to person, place, and time. She has normal reflexes.  Skin: Skin is warm and dry.  Psychiatric: She has a normal mood and affect. Her behavior is normal. Judgment and thought content normal.      BP (!) 120/54 (BP Location: Left Arm, Patient Position: Sitting, Cuff Size: Large)   Pulse 98   Temp 98.1 F (36.7 C) (Oral)   Resp  18   Ht 5\' 1"  (1.549 m)   Wt 132 lb (59.9 kg)   SpO2 98%   BMI 24.94 kg/m  Assessment & Plan:  1. Acute mucoid otitis media of right ear - amoxicillin-clavulanate (AUGMENTIN) 875-125 MG tablet; Take 1 tablet by mouth 2 (two) times daily.  Dispense: 20 tablet; Refill: 0 - acetaminophen (TYLENOL) 500 MG tablet; Take 1 tablet (500 mg total) by mouth every 6 (six) hours as needed.  Dispense: 30 tablet; Refill: 0  2. Neck pain on right side - acetaminophen (TYLENOL) 500 MG  tablet; Take 1 tablet (500 mg total) by mouth every 6 (six) hours as needed.  Dispense: 30 tablet; Refill: 0  3. Generalized anxiety disorder - sertraline (ZOLOFT) 50 MG tablet; Take 1 tablet (50 mg total) by mouth daily.  Dispense: 30 tablet; Refill: 5   RTC: As needed   The patient was given clear instructions to go to ER or return to medical center if symptoms do not improve, worsen or new problems develop. The patient verbalized understanding.

## 2016-10-29 MED FILL — ATORVASTATIN 20 MG TABLET: 20 | 30 days supply | Qty: 30 | Fill #1

## 2016-10-29 MED FILL — SERTRALINE HCL 50 MG TABLET: 50 | 30 days supply | Qty: 30 | Fill #1

## 2016-11-27 MED FILL — SERTRALINE HCL 50 MG TABLET: 50 | 30 days supply | Qty: 30 | Fill #2

## 2016-11-29 MED FILL — ?ATORVASTATIN 20 MG TABLET: 20 | 30 days supply | Qty: 30 | Fill #2

## 2016-12-28 MED FILL — ?ATORVASTATIN 20 MG TABLET: 20 | 30 days supply | Qty: 30 | Fill #3

## 2017-01-01 MED FILL — ?SERTRALINE HCL 50 MG TAB: 50 | 30 days supply | Qty: 30 | Fill #3

## 2017-01-28 MED FILL — ATORVASTATIN 20 MG TABLET: 20 | 30 days supply | Qty: 30 | Fill #4

## 2017-01-28 MED FILL — ?SERTRALINE HCL 50 MG TAB: 50 | 30 days supply | Qty: 30 | Fill #4

## 2017-05-21 ENCOUNTER — Ambulatory Visit: Payer: Self-pay | Attending: Internal Medicine

## 2017-05-27 ENCOUNTER — Telehealth: Payer: Self-pay

## 2017-05-27 DIAGNOSIS — F411 Generalized anxiety disorder: Secondary | ICD-10-CM

## 2017-05-27 DIAGNOSIS — I7409 Other arterial embolism and thrombosis of abdominal aorta: Secondary | ICD-10-CM

## 2017-05-28 MED ORDER — ATORVASTATIN CALCIUM 20 MG PO TABS: 20.0000 mg | ORAL_TABLET | Freq: Every day | ORAL | 1 refills | Status: DC

## 2017-05-28 MED ORDER — SERTRALINE HCL 50 MG PO TABS: 50.0000 mg | ORAL_TABLET | Freq: Every day | ORAL | 5 refills | Status: DC

## 2017-05-28 MED FILL — SERTRALINE HCL 50 MG TABLET: 50 | 30 days supply | Qty: 30 | Fill #0

## 2017-05-28 MED FILL — ?ATORVASTATIN 20 MG TABLET: 20 | 30 days supply | Qty: 30 | Fill #0

## 2017-05-28 NOTE — Telephone Encounter (Signed)
Refills sent in for patient until she comes into appointment. Thanks!

## 2017-05-29 ENCOUNTER — Encounter: Payer: Self-pay | Admitting: Family Medicine

## 2017-05-29 ENCOUNTER — Ambulatory Visit (INDEPENDENT_AMBULATORY_CARE_PROVIDER_SITE_OTHER): Payer: Self-pay | Admitting: Family Medicine

## 2017-05-29 VITALS — BP 118/62 | HR 88 | Temp 98.2°F | Resp 16 | Ht 61.0 in | Wt 127.0 lb

## 2017-05-29 DIAGNOSIS — M549 Dorsalgia, unspecified: Secondary | ICD-10-CM

## 2017-05-29 DIAGNOSIS — F411 Generalized anxiety disorder: Secondary | ICD-10-CM

## 2017-05-29 DIAGNOSIS — I7409 Other arterial embolism and thrombosis of abdominal aorta: Secondary | ICD-10-CM

## 2017-05-29 DIAGNOSIS — J3089 Other allergic rhinitis: Secondary | ICD-10-CM

## 2017-05-29 MED ORDER — ATORVASTATIN CALCIUM 20 MG PO TABS: 20.0000 mg | ORAL_TABLET | Freq: Every day | ORAL | 1 refills | Status: DC

## 2017-05-29 MED ORDER — SERTRALINE HCL 50 MG PO TABS: 50.0000 mg | ORAL_TABLET | Freq: Every day | ORAL | 5 refills | Status: DC

## 2017-05-29 MED ORDER — NAPROXEN 500 MG PO TABS: 500.0000 mg | ORAL_TABLET | Freq: Two times a day (BID) | ORAL | 0 refills | Status: DC

## 2017-05-29 MED ORDER — CETIRIZINE HCL 10 MG PO TABS: 10.0000 mg | ORAL_TABLET | Freq: Every day | ORAL | 11 refills | Status: DC

## 2017-05-29 MED FILL — NAPROXEN 500 MG TABLET: 500 | 15 days supply | Qty: 30 | Fill #0

## 2017-05-29 MED FILL — ?CETIRIZINE HCL 10 MG TABLE: 10 | 30 days supply | Qty: 30 | Fill #0

## 2017-05-29 NOTE — Patient Instructions (Signed)
Will return on Friday, 05/31/2017 for skin tag removal.  Refrain from taking any aspiriin or Ibuprofen on 05/30/2017  Will start a trial of cetirizine 10 mg daily for allergic rhinits      Allergic Rhinitis Allergic rhinitis is when the mucous membranes in the nose respond to allergens. Allergens are particles in the air that cause your body to have an allergic reaction. This causes you to release allergic antibodies. Through a chain of events, these eventually cause you to release histamine into the blood stream. Although meant to protect the body, it is this release of histamine that causes your discomfort, such as frequent sneezing, congestion, and an itchy, runny nose. What are the causes? Seasonal allergic rhinitis (hay fever) is caused by pollen allergens that may come from grasses, trees, and weeds. Year-round allergic rhinitis (perennial allergic rhinitis) is caused by allergens such as house dust mites, pet dander, and mold spores. What are the signs or symptoms?  Nasal stuffiness (congestion).  Itchy, runny nose with sneezing and tearing of the eyes. How is this diagnosed? Your health care provider can help you determine the allergen or allergens that trigger your symptoms. If you and your health care provider are unable to determine the allergen, skin or blood testing may be used. Your health care provider will diagnose your condition after taking your health history and performing a physical exam. Your health care provider may assess you for other related conditions, such as asthma, pink eye, or an ear infection. How is this treated? Allergic rhinitis does not have a cure, but it can be controlled by:  Medicines that block allergy symptoms. These may include allergy shots, nasal sprays, and oral antihistamines.  Avoiding the allergen.  Hay fever may often be treated with antihistamines in pill or nasal spray forms. Antihistamines block the effects of histamine. There are  over-the-counter medicines that may help with nasal congestion and swelling around the eyes. Check with your health care provider before taking or giving this medicine. If avoiding the allergen or the medicine prescribed do not work, there are many new medicines your health care provider can prescribe. Stronger medicine may be used if initial measures are ineffective. Desensitizing injections can be used if medicine and avoidance does not work. Desensitization is when a patient is given ongoing shots until the body becomes less sensitive to the allergen. Make sure you follow up with your health care provider if problems continue. Follow these instructions at home: It is not possible to completely avoid allergens, but you can reduce your symptoms by taking steps to limit your exposure to them. It helps to know exactly what you are allergic to so that you can avoid your specific triggers. Contact a health care provider if:  You have a fever.  You develop a cough that does not stop easily (persistent).  You have shortness of breath.  You start wheezing.  Symptoms interfere with normal daily activities. This information is not intended to replace advice given to you by your health care provider. Make sure you discuss any questions you have with your health care provider. Document Released: 04/17/2001 Document Revised: 03/23/2016 Document Reviewed: 03/30/2013 Elsevier Interactive Patient Education  2017 [employer].

## 2017-05-29 NOTE — Progress Notes (Signed)
Subjective:    Patient ID: Brandi Molina, female    DOB: Dec 14, 1965, 51 y.o.   MRN: [id number]  HPI Brandi Molina, a 51 year old female with a history of hyperlipidemia presents for a follow up of chronic conditions and medication refills. Patient is complaining of right ear discomfort and possible allergic rhinitis. Patient's symptoms include nasal congestion, postnasal drip and swelling of eyes. Patient has not identified triggers of symptoms. She has been experiencing symptoms over the past several weeks. Patient has not attempted any OTC interventions to alleviate current symptoms.  Past Medical History:  Diagnosis Date  . Bartholin gland cyst 2008  . Depression    Social History   Social History  . Marital status: Married    Spouse name: N/A  . Number of children: N/A  . Years of education: N/A   Occupational History  . Not on file.   Social History Main Topics  . Smoking status: Current Every Day Smoker    Packs/day: 1.00    Years: 20.00    Types: Cigarettes  . Smokeless tobacco: Never Used  . Alcohol use No  . Drug use: No  . Sexual activity: Yes   Other Topics Concern  . Not on file   Social History Narrative  . No narrative on file   Immunization History  Administered Date(s) Administered  . Influenza-Unspecified 05/14/2017   Review of Systems  HENT: Positive for ear pain (right ear discomfort).   Respiratory: Negative.   Cardiovascular: Negative.   Gastrointestinal: Negative.   Genitourinary: Negative.   Musculoskeletal: Negative.   Neurological: Negative.   Hematological: Negative.   Psychiatric/Behavioral: Negative.        Objective:   Physical Exam  HENT:  Head: Normocephalic and atraumatic.  Right Ear: There is tenderness. Tympanic membrane is not erythematous.  Left Ear: External ear normal.  Nose: Nose normal.  Mouth/Throat: Oropharynx is clear and moist.  Eyes: Pupils are equal, round, and reactive to light. Conjunctivae are normal.   Neck: Normal range of motion. Neck supple.  Cardiovascular: Normal rate, regular rhythm, normal heart sounds and intact distal pulses.   Pulmonary/Chest: Effort normal and breath sounds normal.  Abdominal: Soft. Bowel sounds are normal.  Lymphadenopathy:       Head (right side): No submental and no submandibular adenopathy present.       Head (left side): No submandibular adenopathy present.       Right cervical: No superficial cervical and no deep cervical adenopathy present.      Left cervical: No superficial cervical and no deep cervical adenopathy present.  Neurological: She has normal strength. No sensory deficit. She displays a negative Romberg sign.      BP 118/62 (BP Location: Right Arm, Patient Position: Sitting, Cuff Size: Normal)   Pulse 88   Temp 98.2 F (36.8 C) (Oral)   Resp 16   Ht 5\' 1"  (1.549 m)   Wt 127 lb (57.6 kg)   SpO2 100%   BMI 24.00 kg/m  Assessment & Plan:  1. Allergic rhinitis due to other allergic trigger, unspecified seasonality - cetirizine (ZYRTEC) 10 MG tablet; Take 1 tablet (10 mg total) by mouth daily.  Dispense: 30 tablet; Refill: 11  2. Generalized anxiety disorder - sertraline (ZOLOFT) 50 MG tablet; Take 1 tablet (50 mg total) by mouth daily.  Dispense: 30 tablet; Refill: 5  3. Aortoiliac occlusive disease ([place]) Will continue Lipitor and repeat fasting lipid panel on 05/31/2017.  - atorvastatin (LIPITOR) 20 MG  tablet; Take 1 tablet (20 mg total) by mouth daily.  Dispense: 90 tablet; Refill: 1  4. Upper back pain on left side - naproxen (NAPROSYN) 500 MG tablet; Take 1 tablet (500 mg total) by mouth 2 (two) times daily.  Dispense: 30 tablet; Refill: 0   RTC: Return on 05/31/2017 for skin tag removal  Brandi Pounds  MSN, FNP-C [facility] [address] [place], [place] [address] [phone]  The patient was given clear instructions to go to ER or return to medical center if symptoms do not  improve, worsen or new problems develop. The patient verbalized understanding. Will notify patient with laboratory results.

## 2017-05-31 ENCOUNTER — Ambulatory Visit (INDEPENDENT_AMBULATORY_CARE_PROVIDER_SITE_OTHER): Payer: Self-pay | Admitting: Family Medicine

## 2017-05-31 ENCOUNTER — Encounter: Payer: Self-pay | Admitting: Family Medicine

## 2017-05-31 ENCOUNTER — Other Ambulatory Visit (HOSPITAL_COMMUNITY)
Admission: RE | Admit: 2017-05-31 | Discharge: 2017-05-31 | Disposition: A | Discharge status: Home / Self Care | Payer: Self-pay | Source: Ambulatory Visit | Attending: Family Medicine | Admitting: Family Medicine

## 2017-05-31 ENCOUNTER — Other Ambulatory Visit: Payer: Self-pay | Admitting: Family Medicine

## 2017-05-31 VITALS — BP 120/71 | HR 92 | Temp 98.6°F | Resp 16 | Ht 61.0 in | Wt 126.0 lb

## 2017-05-31 DIAGNOSIS — L918 Other hypertrophic disorders of the skin: Secondary | ICD-10-CM

## 2017-05-31 DIAGNOSIS — L989 Disorder of the skin and subcutaneous tissue, unspecified: Secondary | ICD-10-CM

## 2017-05-31 DIAGNOSIS — E7849 Other hyperlipidemia: Secondary | ICD-10-CM

## 2017-05-31 MED ORDER — BACITRACIN 500 UNIT/GM EX OINT: 1.0000 "application " | TOPICAL_OINTMENT | Freq: Two times a day (BID) | CUTANEOUS | 0 refills | Status: DC

## 2017-05-31 MED ORDER — LIDOCAINE HCL 0.5 % IJ SOLN: 5.0000 mL | Freq: Once | INTRAMUSCULAR | Status: AC

## 2017-05-31 NOTE — Patient Instructions (Addendum)
Apply bacitracin ointment to right and left axilla twice daily for 10 days.  Leave areas open to air.  Skin Tag, Adult A skin tag (acrochordon) is a soft, extra growth of skin. Most skin tags are flesh-colored and rarely bigger than a pencil eraser. They commonly form near areas where there are folds in the skin, such as the armpit or groin. Skin tags are not dangerous, and they do not spread from person to person (are not contagious). You may have one skin tag or several. Skin tags do not require treatment. However, your health care provider may recommend removal of a skin tag if it:  Gets irritated from clothing.  Bleeds.  Is visible and unsightly.  Your health care provider can remove skin tags with a simple surgical procedure or a procedure that involves freezing the skin tag. Follow these instructions at home:  Watch for any changes in your skin tag. A normal skin tag does not require any other special care at home.  Take over-the-counter and prescription medicines only as told by your health care provider.  Keep all follow-up visits as told by your health care provider. This is important. Contact a health care provider if:  You have a skin tag that: ? Becomes painful. ? Changes color. ? Bleeds. ? Swells.  You develop more skin tags. This information is not intended to replace advice given to you by your health care provider. Make sure you discuss any questions you have with your health care provider. Document Released: 08/07/2015 Document Revised: 03/18/2016 Document Reviewed: 08/07/2015 Elsevier Interactive Patient Education  [employer].

## 2017-05-31 NOTE — Progress Notes (Signed)
  Subjective:     Brandi Molina is a 51 y.o. female who complains of skin tags. The patient wishes skin tag removed as the lesion is getting caught on clothing and/or jewelry and is recurrently irritated. Skin tags are primarily to right and left axilla. Also, to left lateral neck.   Past Medical History:  Diagnosis Date  . Bartholin gland cyst 2008  . Depression    Social History   Social History  . Marital status: Married    Spouse name: N/A  . Number of children: N/A  . Years of education: N/A   Occupational History  . Not on file.   Social History Main Topics  . Smoking status: Current Every Day Smoker    Packs/day: 1.00    Years: 20.00    Types: Cigarettes  . Smokeless tobacco: Never Used  . Alcohol use No  . Drug use: No  . Sexual activity: Yes   Other Topics Concern  . Not on file   Social History Narrative  . No narrative on file   No Known Allergies Review of Systems  Constitutional: Negative for diaphoresis and malaise/fatigue.  Eyes: Negative.   Cardiovascular: Negative.   Gastrointestinal: Negative.   Genitourinary: Negative.   Musculoskeletal: Negative.   Skin:       Skin tag  Endo/Heme/Allergies: Negative.   Psychiatric/Behavioral: Negative.     Objective:    Skin:  skin tag in the right and left axillary region and left lateral neck were removed. Lidocaine anesthetic prior to removal scissors and forceps after alcohol prep; hemostasis is obtained and discarded.    Assessment:    Chronically irritated skin tag, removed.    Plan:   1. Skin tag - Dermatology pathology - bacitracin 500 UNIT/GM ointment; Apply 1 application topically 2 (two) times daily.  Dispense: 15 g; Refill: 0 - lidocaine (XYLOCAINE) 0.5 % (with pres) injection 5 mL; Inject 5 mLs into the skin once.  2. Encounter for removal of skin lesion - lidocaine (XYLOCAINE) 0.5 % (with pres) injection 5 mL; Inject 5 mLs into the skin once.  . The patient is instructed to watch  for signs of infection including erythema, pain,      purulent discharge, or crusting.  . Written patient instruction given.  Follow up as needed for acute illness.     Donia Pounds  MSN, FNP-C [facility] [address] [place],  [address] [phone]

## 2017-06-01 LAB — LIPID PANEL
CHOL/HDL RATIO: 3.6 (calc) (ref <5.0)
CHOLESTEROL: 138 mg/dL (ref <200)
HDL: 38 mg/dL — ABNORMAL LOW (ref >50)
LDL Cholesterol (Calc): 79 mg/dL (calc)
NON-HDL CHOLESTEROL (CALC): 100 mg/dL (ref <130)
Triglycerides: 113 mg/dL (ref <150)

## 2017-06-25 MED FILL — ?ATORVASTATIN 20 MG TABLET: 20 | 30 days supply | Qty: 30 | Fill #1

## 2017-06-25 MED FILL — SERTRALINE HCL 50 MG TABLET: 50 | 30 days supply | Qty: 30 | Fill #1

## 2017-06-25 MED FILL — ?CETIRIZINE HCL 10 MG TABLE: 10 | 30 days supply | Qty: 30 | Fill #1

## 2017-06-28 ENCOUNTER — Ambulatory Visit (HOSPITAL_COMMUNITY)
Admission: EM | Admit: 2017-06-28 | Discharge: 2017-06-28 | Disposition: A | Discharge status: Home / Self Care | Payer: Self-pay | Attending: Emergency Medicine | Admitting: Emergency Medicine

## 2017-06-28 ENCOUNTER — Encounter (HOSPITAL_COMMUNITY): Payer: Self-pay | Admitting: Emergency Medicine

## 2017-06-28 DIAGNOSIS — M6530 Trigger finger, unspecified finger: Secondary | ICD-10-CM

## 2017-06-28 DIAGNOSIS — M778 Other enthesopathies, not elsewhere classified: Secondary | ICD-10-CM

## 2017-06-28 DIAGNOSIS — M779 Enthesopathy, unspecified: Principal | ICD-10-CM

## 2017-06-28 MED ORDER — NAPROXEN 375 MG PO TABS: 375.0000 mg | ORAL_TABLET | Freq: Two times a day (BID) | ORAL | 0 refills | Status: DC

## 2017-06-28 NOTE — Discharge Instructions (Signed)
Wear the splint most of the time for 2 weeks and especially while working. Use ice for the next few days to help the swelling. Take the Naprosyn for inflammation. Limit use of thumb for the next couple weeks but may remove the splint to allow some thumb range of motion. Follow-up with the hand surgeon if you are not getting better in a couple weeks.

## 2017-06-28 NOTE — ED Triage Notes (Signed)
Pt sts chronic pain in right thumb that has been worse x 2 weeks; pt denies new injury

## 2017-06-28 NOTE — ED Provider Notes (Signed)
[facility]    CSN: [id number] Arrival date & time: 06/28/17  1407     History   Chief Complaint Chief Complaint  Patient presents with  . Hand Pain    HPI Brandi Molina is a 51 y.o. female.   HPI  Past Medical History:  Diagnosis Date  . Bartholin gland cyst 2008  . Depression     Patient Active Problem List   Diagnosis Date Noted  . Generalized anxiety disorder 10/31/2015  . Vitamin D deficiency 09/20/2015  . Chronic back pain 09/19/2015  . Nicotine dependence 09/03/2014  . Aortoiliac occlusive disease ([place]) 09/03/2014  . Abscess of Bartholin's gland 09/24/2011    Past Surgical History:  Procedure Laterality Date  . CHOLECYSTECTOMY    . CHOLECYSTECTOMY  1999    OB History    Gravida Para Term Preterm AB Living   2 1 1   1 1    SAB TAB Ectopic Multiple Live Births     1             Home Medications    Prior to Admission medications   Medication Sig Start Date End Date Taking? Authorizing Provider  acetaminophen (TYLENOL) 500 MG tablet Take 1 tablet (500 mg total) by mouth every 6 (six) hours as needed. 10/01/16   Dorena Dew, FNP  aspirin 81 MG tablet Take 81 mg by mouth daily.    [provider]  atorvastatin (LIPITOR) 20 MG tablet Take 1 tablet (20 mg total) by mouth daily. 05/29/17   Dorena Dew, FNP  bacitracin 500 UNIT/GM ointment Apply 1 application topically 2 (two) times daily. 05/31/17   Dorena Dew, FNP  cetirizine (ZYRTEC) 10 MG tablet Take 1 tablet (10 mg total) by mouth daily. 05/29/17   Dorena Dew, FNP  Cholecalciferol (VITAMIN D) 2000 units CAPS Take 1 capsule (2,000 Units total) by mouth daily. 09/20/15   Dorena Dew, FNP  naproxen (NAPROSYN) 375 MG tablet Take 1 tablet (375 mg total) by mouth 2 (two) times daily. 06/28/17   Janne Napoleon, NP  sertraline (ZOLOFT) 50 MG tablet Take 1 tablet (50 mg total) by mouth daily. 05/29/17   Dorena Dew, FNP    Family History Family History   Problem Relation Age of Onset  . Diabetes Mother   . Heart disease Mother   . Heart attack Mother   . AAA (abdominal aortic aneurysm) Mother   . Cancer Father   . Hypertension Brother   . Anesthesia problems Neg Hx   . Hypotension Neg Hx   . Malignant hyperthermia Neg Hx   . Pseudochol deficiency Neg Hx     Social History Social History   Tobacco Use  . Smoking status: Current Every Day Smoker    Packs/day: 1.00    Years: 20.00    Pack years: 20.00    Types: Cigarettes  . Smokeless tobacco: Never Used  Substance Use Topics  . Alcohol use: No    Alcohol/week: 0.0 oz  . Drug use: No     Allergies   Patient has no known allergies.   Review of Systems Review of Systems   Physical Exam Triage Vital Signs ED Triage Vitals  Enc Vitals Group     BP 06/28/17 1428 (!) 117/49     Pulse Rate 06/28/17 1428 81     Resp 06/28/17 1428 18     Temp 06/28/17 1428 98.3 F (36.8 C)     Temp Source 06/28/17  1428 Oral     SpO2 06/28/17 1428 99 %     Weight --      Height --      Head Circumference --      Peak Flow --      Pain Score 06/28/17 1429 10     Pain Loc --      Pain Edu? --      Excl. in [place]? --    No data found.  Updated Vital Signs BP (!) 117/49 (BP Location: Right Arm)   Pulse 81   Temp 98.3 F (36.8 C) (Oral)   Resp 18   SpO2 99%   Visual Acuity Right Eye Distance:   Left Eye Distance:   Bilateral Distance:    Right Eye Near:   Left Eye Near:    Bilateral Near:     Physical Exam   UC Treatments / Results  Labs (all labs ordered are listed, but only abnormal results are displayed) Labs Reviewed - No data to display  EKG  EKG Interpretation None       Radiology No results found.  Procedures Procedures (including critical care time)  Medications Ordered in UC Medications - No data to display   Initial Impression / Assessment and Plan / UC Course  I have reviewed the triage vital signs and the nursing notes.  Pertinent labs  & imaging results that were available during my care of the patient were reviewed by me and considered in my medical decision making (see chart for details).    Wear the splint most of the time for 2 weeks and especially while working. Use ice for the next few days to help the swelling. Take the Naprosyn for inflammation. Limit use of thumb for the next couple weeks but may remove the splint to allow some thumb range of motion. Follow-up with the hand surgeon if you are not getting better in a couple weeks.     Final Clinical Impressions(s) / UC Diagnoses   Final diagnoses:  Tendinitis of thumb  Trigger finger, acquired    ED Discharge Orders        Ordered    naproxen (NAPROSYN) 375 MG tablet  2 times daily     06/28/17 1607       Controlled Substance Prescriptions [place] Controlled Substance Registry consulted? Not Applicable   Janne Napoleon, NP 06/28/17 [phone]

## 2017-07-04 ENCOUNTER — Ambulatory Visit (HOSPITAL_COMMUNITY)
Admission: RE | Admit: 2017-07-04 | Discharge: 2017-07-04 | Disposition: A | Discharge status: Home / Self Care | Payer: Self-pay | Source: Ambulatory Visit | Attending: Family Medicine | Admitting: Family Medicine

## 2017-07-04 ENCOUNTER — Ambulatory Visit (INDEPENDENT_AMBULATORY_CARE_PROVIDER_SITE_OTHER): Payer: Self-pay | Admitting: Family Medicine

## 2017-07-04 ENCOUNTER — Encounter: Payer: Self-pay | Admitting: Family Medicine

## 2017-07-04 VITALS — BP 105/61 | HR 71 | Temp 97.9°F | Resp 16 | Ht 61.0 in | Wt 130.0 lb

## 2017-07-04 DIAGNOSIS — M79644 Pain in right finger(s): Secondary | ICD-10-CM | POA: Insufficient documentation

## 2017-07-04 DIAGNOSIS — M779 Enthesopathy, unspecified: Secondary | ICD-10-CM

## 2017-07-04 MED ORDER — PREDNISONE 20 MG PO TABS: 20.0000 mg | ORAL_TABLET | Freq: Every day | ORAL | 0 refills | Status: DC

## 2017-07-04 NOTE — Patient Instructions (Addendum)
Will follow up by phone after reviewing right thumb xray.  Continue using splinting I have sent a referral to sports medicine.  Rest. Avoid activities that require repetitive gripping, repeated grasping or the prolonged use of vibrating hand-held machinery until your symptoms improve. If you can't avoid these activities altogether, padded gloves may offer some protection.  A splint. Wear a splint at night to keep the affected finger in an extended position for up to six weeks. The splint helps rest the tendon.  Stretching exercises. Your doctor may also suggest gentle exercises to help maintain mobility in your finger.

## 2017-07-04 NOTE — Progress Notes (Signed)
Subjective:    Patient ID: Brandi Molina, female    DOB: [birth year], 51 y.o.   MRN: [id number]  HPI  Sukhmani Fetherolf, a 51 year old female with a history of right hand pain, primarily to thumb. She denies an injury to right thumb.  Patient states that right thumb pain started several weeks ago.  Patient was evaluated in the emergency department on 06/28/2017.  She was treated with a splint to right thumb and naproxen twice daily.  She states that she was instructed to wear splint for 2 weeks.  She states that pain intensity has increased, primarily at night.  She has been taken naproxen 500 mg consistently without sustained relief.  She states that when right thumb splint is removed she has throbbing pain.  Current pain intensity is 2 out of 10.  She history of arthritis, or gout. She reports a history of a right hand injury many years ago.    Past Medical History:  Diagnosis Date  . Bartholin gland cyst 2008  . Depression    Social History   Socioeconomic History  . Marital status: Married    Spouse name: Not on file  . Number of children: Not on file  . Years of education: Not on file  . Highest education level: Not on file  Social Needs  . Financial resource strain: Not on file  . Food insecurity - worry: Not on file  . Food insecurity - inability: Not on file  . Transportation needs - medical: Not on file  . Transportation needs - non-medical: Not on file  Occupational History  . Not on file  Tobacco Use  . Smoking status: Current Every Day Smoker    Packs/day: 1.00    Years: 20.00    Pack years: 20.00    Types: Cigarettes  . Smokeless tobacco: Never Used  Substance and Sexual Activity  . Alcohol use: No    Alcohol/week: 0.0 oz  . Drug use: No  . Sexual activity: Yes  Other Topics Concern  . Not on file  Social History Narrative  . Not on file   Immunization History  Administered Date(s) Administered  . Influenza-Unspecified 05/14/2017   Review of Systems    Constitutional: Negative for fatigue, fever and unexpected weight change.  HENT: Negative.   Eyes: Negative.   Respiratory: Negative.   Cardiovascular: Negative.   Gastrointestinal: Negative.   Endocrine: Negative.   Genitourinary: Negative.   Musculoskeletal: Positive for arthralgias.       Right hand pain  Skin: Negative.   Allergic/Immunologic: Negative.   Hematological: Negative.   Psychiatric/Behavioral: Negative.       BP 105/61 (BP Location: Right Arm, Patient Position: Sitting, Cuff Size: Normal)   Pulse 71   Temp 97.9 F (36.6 C) (Oral)   Resp 16   Ht 5\' 1"  (1.549 m)   Wt 130 lb (59 kg)   SpO2 98%   BMI 24.56 kg/m  Objective:   Physical Exam  Constitutional: She is oriented to person, place, and time. She appears well-developed and well-nourished.  HENT:  Left Ear: External ear normal.  Cardiovascular: Normal rate, regular rhythm, normal heart sounds and intact distal pulses.  Pulmonary/Chest: Effort normal and breath sounds normal.  Abdominal: Soft. Bowel sounds are normal.  Musculoskeletal:       Right wrist: She exhibits decreased range of motion and tenderness.       Arms: Neurological: She is alert and oriented to person, place, and time. She  has normal reflexes.  Skin: Skin is warm and dry.  Psychiatric: She has a normal mood and affect. Her behavior is normal. Judgment and thought content normal.      BP 105/61 (BP Location: Right Arm, Patient Position: Sitting, Cuff Size: Normal)   Pulse 71   Temp 97.9 F (36.6 C) (Oral)   Resp 16   Ht 5\' 1"  (1.549 m)   Wt 130 lb (59 kg)   SpO2 98%   BMI 24.56 kg/m     Assessment & Plan:  1. Pain of right thumb We will send a referral to sports medicine for further workup and evaluation.   Continue right thumb splint and I will defer to sports medicine for further treatment and evaluation.  Patient has injured her right hand in the past will order an x-ray of the right thumb.  Will follow up with patient by  phone after reviewing x-ray. - DG Finger Thumb Right; Future - Ambulatory referral to Sports Medicine  2. Tendonitis We will start a trial of prednisone 20 mg daily for 5 days.  - predniSONE (DELTASONE) 20 MG tablet; Take 1 tablet (20 mg total) by mouth daily with breakfast.  Dispense: 5 tablet; Refill: 0   RTC: 3 months for chronic conditions   Donia Pounds  MSN, FNP-C [facility] [address] [place], [place] [address] [phone]    The patient was given clear instructions to go to ER or return to medical center if symptoms do not improve, worsen or new problems develop. The patient verbalized understanding.

## 2017-07-10 ENCOUNTER — Encounter: Payer: Self-pay | Admitting: Family Medicine

## 2017-07-10 ENCOUNTER — Ambulatory Visit (INDEPENDENT_AMBULATORY_CARE_PROVIDER_SITE_OTHER): Payer: Self-pay | Admitting: Family Medicine

## 2017-07-10 VITALS — BP 126/82 | Ht 60.0 in | Wt 127.0 lb

## 2017-07-10 DIAGNOSIS — M65311 Trigger thumb, right thumb: Secondary | ICD-10-CM

## 2017-07-10 MED ORDER — METHYLPREDNISOLONE ACETATE 40 MG/ML IJ SUSP
40.0000 mg | Freq: Once | INTRAMUSCULAR | Status: AC
  Administered 2017-07-10: 40 mg via INTRA_ARTICULAR

## 2017-07-10 NOTE — Progress Notes (Signed)
Subjective:    Patient ID: Brandi Molina, female    DOB: 1965-09-14, 51 y.o.   MRN: [id number]  Brandi Molina is a 51yo right-handed female who presents today complaining of right thumb pain for the past month. Two weeks ago she was seen by Urgent care for this issue and was diagnosed with Trigger Thumb and given  A 5 day course of prednisone (which the patient never started). Her PCP recommended Naproxen which the patient has been taking. She was also given a splint to wear that she discontinued due to it not working and being bothersome. 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 She notes that the pain is more on the palmer side of her thumb with associated swelling, but no redness. In the mornings and evenings her thumb will feel stiff and she will have to "concentrate" to get it to bend. When it does there is often a popping and clicking sensation with shooting pain. It is a 2/10 at rest but can increase to 10/10 at it's worst.      Review of Systems  Constitutional: Negative.   HENT: Negative.   Eyes: Negative.   Respiratory: Negative.   Cardiovascular: Negative.   Gastrointestinal: Negative.   Endocrine: Negative.   Genitourinary: Negative.   Musculoskeletal: Positive for arthralgias (Right thumb pain) and joint swelling (Right Thenar).  Skin: Negative.   Allergic/Immunologic: Negative.   Neurological: Negative.   Hematological: Negative.   Psychiatric/Behavioral: Negative.        Objective:   Physical Exam  Constitutional: She is oriented to person, place, and time. She appears well-developed and well-nourished. No distress.  HENT:  Head: Normocephalic and atraumatic.  Eyes: Conjunctivae and EOM are normal. Pupils are equal, round, and reactive to light.  Neck: Normal range of motion. Neck supple. No tracheal deviation present.  Pulmonary/Chest: Effort normal. No stridor.  Neurological: She is alert and oriented to person, place, and time.  Skin: Skin is  warm and dry. She is not diaphoretic.  Psychiatric: She has a normal mood and affect. Her behavior is normal.   MSK: Right Thenar eminence appears swollen compared to left. Tenderness to palpation over area of right A1 pully of thumb. Right thumb has noticeable click when flexing at the IP joint. Is unable to bend IP joint to 90 degrees. Has full range of motion when touching thumb to each finger. Good interossi strength and OK sign strength. Positive thumb grinding test. Negative Finkelstein test.         Assessment & Plan:  #Acute R trigger finger, thumb with pain As evidenced by popping with flexion and tenderness to palpation over right A1 pully. Management includes medicine, injection or possible surgery. Patient would like to proceed with injection today. Counseled patient that 50% of steroid injections are unsuccessful and she may require surgery for release. Patient voiced understanding and wished to proceed. Differentials include: OA given patient's pain with grinding test. However, this does not seem to be the source of the greatest pain. Gout, but timeline is not consistent vs. Brandi Molina (negative Finkelstein test)  -Injection today, see below -RTC in 3-4 weeks   Procedure: Injection of right A1 pully of thumb Consent obtained and verified. Time-out conducted. Noted no overlying erythema, induration, or other signs of local infection. Site palpated and marked Skin prepped in a sterile fashion. Topical analgesic spray: Ethyl chloride. Lidocaine/Depo-medrol combination advanced with 5/8" 25G needle was advanced to the marked site of greatest tenderness and  the medication freely flowed into the capsule. Completed without difficulty.  Meds: 0.5 cc Depo-medrol 40 and 0.5 cc lidocaine 1% Bleeding stopped with pressure. Patient tolerated the procedure well.   I independently saw and evaluated the patient with Johnathan Hausen, MD, PGY2. Note above dictated by Dr. Noberto Retort. In brief  review, Brandi Molina is a 51 year old female who presents to the sports medicine office today with chief complaint of right thumb pain. She reports symptoms have been present for about 3-4 weeks. No inciting factor. 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 She was kindly referred here from her primary care physician due to thumb pain. XR was ordered by her primary physician, which does show she has normal variant sesamoid bones on flexor side of IP and 1st MCP. 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 Overall though, her symptoms are consistent with trigger finger of the right thumb in the A1 pulley as nodule is felt. Discussed option of cortisone injection into A1 pulley, which she is agreeable to. No complications noted. She will return to the office in 3-4 weeks for follow up or sooner as needed.   Mort Sawyers, MD Primary Care Sports Medicine Fellow [facility]

## 2017-07-31 MED FILL — ?CETIRIZINE HCL 10 MG TABLE: 10 | 30 days supply | Qty: 30 | Fill #2

## 2017-07-31 MED FILL — SERTRALINE HCL 50 MG TABLET: 50 | 30 days supply | Qty: 30 | Fill #2

## 2017-07-31 MED FILL — ATORVASTATIN 20 MG TABLET: 20 | 30 days supply | Qty: 30 | Fill #2

## 2017-08-07 ENCOUNTER — Ambulatory Visit (INDEPENDENT_AMBULATORY_CARE_PROVIDER_SITE_OTHER): Payer: Self-pay | Admitting: Family Medicine

## 2017-08-07 ENCOUNTER — Encounter: Payer: Self-pay | Admitting: Family Medicine

## 2017-08-07 VITALS — BP 108/61 | Ht 61.0 in | Wt 127.0 lb

## 2017-08-07 DIAGNOSIS — M65311 Trigger thumb, right thumb: Secondary | ICD-10-CM

## 2017-08-07 NOTE — Progress Notes (Signed)
Chief complaint: Follow-up right thumb pain, with diagnosis of right thumb trigger finger  History of present illness: Brandi Molina is a 52 year old female who presents to the sports medicine office today for follow-up of right thumb pain. She was last seen here about a month ago back on 07/10/17. When she was seen here, she wound to have trigger thumb of her right thumb. Cortisone injection was done in to the A1 pulley of the right trigger thumb, with symptom relief. She reports of no pain, locking, or triggering of her thumb today. She reports that she has not had any interval injury or trauma. No numbness, tingling, burning paresthesias. She does not have any use any medications for pain. She otherwise is not report of any acute concerns today. No fevers, chills, night sweats, oriented to weight loss. No wrist pain, hand pain, other finger pain.  Review of systems:  As stated above  Interval past medical history, surgical history, family history, and social history obtained and unchanged.  Physical exam: Vital signs are reviewed and are documented in the chart Gen.: Alert, oriented, appears stated age, in no apparent distress HEENT: Moist oral mucosa Respiratory: Normal respirations, able to speak in full sentences Cardiac: Regular rate, distal pulses 2+ Integumentary: No rashes on visible skin:  Neurologic: Strength 5/5, sensation 2+ in bilateral upper extremities Psych: Normal affect, mood is described as good Musculoskeletal: Inspection of right thumb reveals no obvious deformity or muscle atrophy, no warmth, erythema, ecchymosis, or effusion, no nodule felt over the A1 pulley today, she does have full range of motion of her right thumb, has full strength, is neurovascularly intact distally  Assessment and plan: 1. Right thumb trigger finger, symptoms resolved after cortisone injection back on 07/10/17  Plan: Fortunately, her symptoms have fully resolved today and she is not having any  issues. I did discuss at there is a potential that this could recur again or could involve another finger. Discussed that she can come back and have a repeat cortisone injection if this does occur. Otherwise, we'll keep things open ended and we'll have her return on as-needed basis.   Mort Sawyers, M.D. [place]

## 2017-08-27 MED FILL — ?ATORVASTATIN 20MG TABLET: 20 | 30 days supply | Qty: 30 | Fill #3

## 2017-08-27 MED FILL — ?CETIRIZINE HCL 10 MG TABLE: 10 | 30 days supply | Qty: 30 | Fill #3

## 2017-08-27 MED FILL — SERTRALINE HCL 50 MG TABLET: 50 | 30 days supply | Qty: 30 | Fill #3

## 2017-09-17 ENCOUNTER — Telehealth: Payer: Self-pay

## 2017-09-18 MED ORDER — NAPROXEN 375 MG PO TABS: 375.0000 mg | ORAL_TABLET | Freq: Two times a day (BID) | ORAL | 0 refills | Status: DC

## 2017-09-18 NOTE — Telephone Encounter (Signed)
Refill for naproxen sent into pharmacy. Thanks!

## 2017-10-01 MED FILL — ATORVASTATIN 20 MG TABLET: 20 | 30 days supply | Qty: 30 | Fill #4

## 2017-10-01 MED FILL — ?CETIRIZINE HCL 10 MG TABLE: 10 | 30 days supply | Qty: 30 | Fill #4

## 2017-10-01 MED FILL — SERTRALINE HCL 50 MG TABLET: 50 | 30 days supply | Qty: 30 | Fill #4

## 2017-10-29 MED FILL — ?ATORVASTATIN 20 MG TABLET: 20 | 30 days supply | Qty: 30 | Fill #5

## 2017-10-29 MED FILL — SERTRALINE HCL 50 MG TABLET: 50 | 30 days supply | Qty: 30 | Fill #5

## 2017-10-29 MED FILL — ?CETIRIZINE HCL 10 MG TABLE: 10 | 30 days supply | Qty: 30 | Fill #5

## 2017-11-27 MED FILL — SERTRALINE HCL 50 MG TABS: 50 | 30 days supply | Qty: 30 | Fill #0

## 2017-11-27 MED FILL — ?CETIRIZINE HCL 10 MG TABLE: 10 | 30 days supply | Qty: 30 | Fill #6

## 2017-11-29 ENCOUNTER — Encounter: Payer: Self-pay | Admitting: Family Medicine

## 2017-11-29 ENCOUNTER — Ambulatory Visit (INDEPENDENT_AMBULATORY_CARE_PROVIDER_SITE_OTHER): Payer: Self-pay | Admitting: Family Medicine

## 2017-11-29 VITALS — BP 106/66 | HR 76 | Temp 97.7°F | Ht 61.0 in | Wt 128.0 lb

## 2017-11-29 DIAGNOSIS — F411 Generalized anxiety disorder: Secondary | ICD-10-CM

## 2017-11-29 DIAGNOSIS — Z131 Encounter for screening for diabetes mellitus: Secondary | ICD-10-CM

## 2017-11-29 DIAGNOSIS — I7409 Other arterial embolism and thrombosis of abdominal aorta: Secondary | ICD-10-CM

## 2017-11-29 DIAGNOSIS — R05 Cough: Secondary | ICD-10-CM

## 2017-11-29 DIAGNOSIS — R0989 Other specified symptoms and signs involving the circulatory and respiratory systems: Secondary | ICD-10-CM

## 2017-11-29 DIAGNOSIS — J301 Allergic rhinitis due to pollen: Secondary | ICD-10-CM

## 2017-11-29 DIAGNOSIS — J3089 Other allergic rhinitis: Secondary | ICD-10-CM

## 2017-11-29 DIAGNOSIS — R058 Other specified cough: Secondary | ICD-10-CM

## 2017-11-29 LAB — POCT URINALYSIS DIP (MANUAL ENTRY)
BILIRUBIN UA: NEGATIVE
BILIRUBIN UA: NEGATIVE mg/dL
Glucose, UA: NEGATIVE mg/dL
LEUKOCYTES UA: NEGATIVE
Nitrite, UA: NEGATIVE
PH UA: 6 (ref 5.0–8.0)
Protein Ur, POC: NEGATIVE mg/dL
Spec Grav, UA: <=1.005 — AB (ref 1.010–1.025)
Urobilinogen, UA: 0.2 E.U./dL

## 2017-11-29 LAB — POCT GLYCOSYLATED HEMOGLOBIN (HGB A1C): HEMOGLOBIN A1C: 5.4

## 2017-11-29 MED ORDER — SERTRALINE HCL 50 MG PO TABS: 50.0000 mg | ORAL_TABLET | Freq: Every day | ORAL | 5 refills | Status: DC

## 2017-11-29 MED ORDER — PROMETHAZINE-PHENYLEPHRINE 6.25-5 MG/5ML PO SYRP: 5.0000 mL | ORAL_SOLUTION | ORAL | 0 refills | Status: DC | PRN

## 2017-11-29 MED ORDER — AZITHROMYCIN 250 MG PO TABS: ORAL_TABLET | ORAL | 0 refills | Status: DC

## 2017-11-29 MED ORDER — CETIRIZINE HCL 10 MG PO TABS: 10.0000 mg | ORAL_TABLET | Freq: Every day | ORAL | 11 refills | Status: DC

## 2017-11-29 MED FILL — AZITHROMYCIN 250 MG TABLET: 250 | 5 days supply | Qty: 6 | Fill #0

## 2017-11-29 MED FILL — PROMETHAZINE-PHENYLEPHRINE: 6.25-5 | 9 days supply | Qty: 280 | Fill #0

## 2017-11-29 NOTE — Progress Notes (Signed)
Subjective:    Patient ID: Brandi Molina, female    DOB: 12-May-1966, 52 y.o.   MRN: [id number]  HPI Brandi Molina, a 52 year old female presents complaining of upper respiratory symptoms over the past several weeks.  Symptoms include congestion, cough, plugged sensation in both ears and sore throat. Onset of symptoms was 2 weeks ago, and have been worsening since that time. . She also c/o achiness, congestion and headache described as intermittent. Brandi Molina has taken OTC Mucinex without satisfactory relief..  She is drinking moderate amounts of fluids.  She currently denies fever, chest pain, heart palpitations, or malaise.  Past Medical History:  Diagnosis Date  . Bartholin gland cyst 2008  . Depression    Social History   Socioeconomic History  . Marital status: Married    Spouse name: Not on file  . Number of children: Not on file  . Years of education: Not on file  . Highest education level: Not on file  Occupational History  . Not on file  Social Needs  . Financial resource strain: Not on file  . Food insecurity:    Worry: Not on file    Inability: Not on file  . Transportation needs:    Medical: Not on file    Non-medical: Not on file  Tobacco Use  . Smoking status: Current Every Day Smoker    Packs/day: 1.00    Years: 20.00    Pack years: 20.00    Types: Cigarettes  . Smokeless tobacco: Never Used  Substance and Sexual Activity  . Alcohol use: No    Alcohol/week: 0.0 oz  . Drug use: No  . Sexual activity: Yes  Lifestyle  . Physical activity:    Days per week: Not on file    Minutes per session: Not on file  . Stress: Not on file  Relationships  . Social connections:    Talks on phone: Not on file    Gets together: Not on file    Attends religious service: Not on file    Active member of club or organization: Not on file    Attends meetings of clubs or organizations: Not on file    Relationship status: Not on file  . Intimate partner violence:    Fear  of current or ex partner: Not on file    Emotionally abused: Not on file    Physically abused: Not on file    Forced sexual activity: Not on file  Other Topics Concern  . Not on file  Social History Narrative  . Not on file   . Immunization History  Administered Date(s) Administered  . Influenza-Unspecified 05/14/2017      Review of Systems  Constitutional: Negative.   HENT: Positive for ear pain (right ear) and sinus pressure.   Eyes: Negative.   Respiratory: Positive for cough.   Cardiovascular: Negative.   Gastrointestinal: Negative.   Endocrine: Negative for polydipsia, polyphagia and polyuria.  Musculoskeletal: Negative.   Allergic/Immunologic: Positive for environmental allergies.  Neurological: Negative.   Hematological: Negative.   Psychiatric/Behavioral: Negative.        Objective:   Physical Exam  Constitutional: She is oriented to person, place, and time.  HENT:  Head: Normocephalic and atraumatic.  Right Ear: External ear normal. Tympanic membrane is erythematous.  Left Ear: External ear normal. Tympanic membrane is erythematous.  Nose: Mucosal edema present. Right sinus exhibits maxillary sinus tenderness. Left sinus exhibits maxillary sinus tenderness.  Mouth/Throat: Oropharynx is clear and moist.  Eyes: Pupils are equal, round, and reactive to light. Conjunctivae and EOM are normal.  Neck: Normal range of motion. Neck supple.  Cardiovascular: Normal rate, regular rhythm, normal heart sounds and intact distal pulses.  Pulmonary/Chest: Effort normal and breath sounds normal.  Abdominal: Soft. Bowel sounds are normal.  Neurological: She is alert and oriented to person, place, and time.  Skin: Skin is warm and dry.  Psychiatric: She has a normal mood and affect. Her behavior is normal. Judgment and thought content normal.     BP 106/66 (BP Location: Right Arm, Patient Position: Sitting, Cuff Size: Small)   Pulse 76   Temp 97.7 F (36.5 C) (Oral)   Ht  5\' 1"  (1.549 m)   Wt 128 lb (58.1 kg)   SpO2 98%   BMI 24.19 kg/m  Assessment & Plan:  1. Screening for diabetes mellitus - POCT glycosylated hemoglobin (Hb A1C) - POCT urinalysis dipstick  2. Symptoms of upper respiratory infection (URI) Increase rest, handwashing, and fluid intake - azithromycin (ZITHROMAX) 250 MG tablet; 500 mg on day 1. Days 2-5 250 mg daily  Dispense: 6 tablet; Refill: 0 - promethazine-phenylephrine (PROMETHAZINE VC PLAIN) 6.25-5 MG/5ML SYRP; Take 5 mLs by mouth every 4 (four) hours as needed for congestion.  Dispense: 280 mL; Refill: 0  3. Cough productive of purulent sputum - promethazine-phenylephrine (PROMETHAZINE VC PLAIN) 6.25-5 MG/5ML SYRP; Take 5 mLs by mouth every 4 (four) hours as needed for congestion.  Dispense: 280 mL; Refill: 0  4. Allergic rhinitis due to pollen, unspecified seasonality Continue Cetirizine 10 mg daily  5. Generalized anxiety disorder - sertraline (ZOLOFT) 50 MG tablet; Take 1 tablet (50 mg total) by mouth daily.  Dispense: 30 tablet; Refill: 5  6. Aortoiliac occlusive disease ([place]  - Lipid Panel   RTC: 6 months for chronic conditions  Donia Pounds  MSN, FNP-C [facility] [address] [place], [place] [address] [phone]

## 2017-11-29 NOTE — Patient Instructions (Addendum)
I suspect that you have an upper respiratory infection due to the length of time and the symptoms that you experiencing.  We will start a course of antibiotics.  Will start azithromycin 500 mg on day 1 and 250 mg on days 2 through 5.  For all other respiratory symptoms will start a trial of promethazine, take 5 mils every 4-6 hours.  Increase rest, handwashing, and fluid intake.   We will follow-up by phone with any abnormal laboratory results

## 2017-11-29 NOTE — Progress Notes (Signed)
poct

## 2017-11-30 LAB — LIPID PANEL
CHOL/HDL RATIO: 2.7 ratio (ref 0.0–4.4)
Cholesterol, Total: 100 mg/dL (ref 100–199)
HDL: 37 mg/dL — AB (ref >39)
LDL CALC: 48 mg/dL (ref 0–99)
TRIGLYCERIDES: 76 mg/dL (ref 0–149)
VLDL CHOLESTEROL CAL: 15 mg/dL (ref 5–40)

## 2017-12-02 ENCOUNTER — Telehealth: Payer: Self-pay

## 2017-12-02 ENCOUNTER — Other Ambulatory Visit: Payer: Self-pay

## 2017-12-02 DIAGNOSIS — I7409 Other arterial embolism and thrombosis of abdominal aorta: Secondary | ICD-10-CM

## 2017-12-02 MED ORDER — ATORVASTATIN CALCIUM 20 MG PO TABS: 20.0000 mg | ORAL_TABLET | Freq: Every day | ORAL | 1 refills | Status: DC

## 2017-12-02 MED FILL — ?ATORVASTATIN 20 MG TABLET: 20 | 30 days supply | Qty: 30 | Fill #0

## 2017-12-02 NOTE — Telephone Encounter (Signed)
-----   Message from Dorena Dew, [place] sent at 12/02/2017  5:27 AM EDT ----- Regarding: lab results Please inform patient that labs are consistent with baseline, will continue atorvastatin as previously prescribed. Recommend a lowfat, low carbohydrate diet divided over 5-6 small meals, increase water intake to 6-8 glasses, and 150 minutes per week of cardiovascular exercise.   Donia Pounds  MSN, FNP-C [facility] [address] [place],  [address] [phone]

## 2017-12-02 NOTE — Telephone Encounter (Signed)
Called and spoke with patient, advised that labs are consistent with baseline and we will continue atorvastatin as previously prescribed. Recommended that she eat a low fat/ low carb diet over 5 to 6 small meals daily, drink 6 to 8 glasses of water daily and exercise 150 minutes weekly. Patient verbalized understanding. Thanks!

## 2018-01-09 MED FILL — ATORVASTATIN 20 MG TABLET: 20 | 30 days supply | Qty: 30 | Fill #1

## 2018-01-09 MED FILL — SERTRALINE HCL 50 MG TABS: 50 | 30 days supply | Qty: 30 | Fill #1

## 2018-01-15 ENCOUNTER — Ambulatory Visit: Payer: Self-pay | Attending: Family Medicine

## 2018-01-15 MED FILL — ?CETIRIZINE HCL 10 MG TABLE: 10 | 30 days supply | Qty: 30 | Fill #0

## 2018-02-11 MED FILL — ?CETIRIZINE HCL 10 MG TABLE: 10 | 30 days supply | Qty: 30 | Fill #1

## 2018-02-11 MED FILL — SERTRALINE HCL 50 MG TABS: 50 | 30 days supply | Qty: 30 | Fill #2

## 2018-02-11 MED FILL — ?ATORVASTATIN 20 MG TABLET: 20 | 30 days supply | Qty: 30 | Fill #2

## 2018-03-17 MED FILL — ?CETIRIZINE HCL 10 MG TABLE: 10 | 30 days supply | Qty: 30 | Fill #2

## 2018-03-17 MED FILL — ?ATORVASTATIN 20 MG TABLET: 20 | 30 days supply | Qty: 30 | Fill #3

## 2018-03-17 MED FILL — SERTRALINE HCL 50 MG TABS: 50 | 30 days supply | Qty: 30 | Fill #3

## 2018-04-14 MED FILL — SERTRALINE HCL 50 MG TABS: 50 | 30 days supply | Qty: 30 | Fill #4

## 2018-04-14 MED FILL — ?CETIRIZINE HCL 10 MG TABLE: 10 | 30 days supply | Qty: 30 | Fill #3

## 2018-04-14 MED FILL — ?ATORVASTATIN 20 MG TABLET: 20 | 30 days supply | Qty: 30 | Fill #4

## 2018-05-16 MED FILL — SERTRALINE HCL 50 MG TABS: 50 | 30 days supply | Qty: 30 | Fill #5

## 2018-05-16 MED FILL — ?ATORVASTATIN 20 MG TABLET: 20 | 30 days supply | Qty: 30 | Fill #5

## 2018-05-16 MED FILL — ?CETIRIZINE HCL 10 MG TABLE: 10 | 30 days supply | Qty: 30 | Fill #4

## 2018-06-02 ENCOUNTER — Encounter: Payer: Self-pay | Admitting: Family Medicine

## 2018-06-02 ENCOUNTER — Ambulatory Visit (INDEPENDENT_AMBULATORY_CARE_PROVIDER_SITE_OTHER): Payer: Self-pay | Admitting: Family Medicine

## 2018-06-02 VITALS — BP 111/56 | HR 78 | Temp 98.5°F | Resp 16 | Ht 61.0 in | Wt 126.0 lb

## 2018-06-02 DIAGNOSIS — F411 Generalized anxiety disorder: Secondary | ICD-10-CM

## 2018-06-02 DIAGNOSIS — L739 Follicular disorder, unspecified: Secondary | ICD-10-CM

## 2018-06-02 DIAGNOSIS — Z113 Encounter for screening for infections with a predominantly sexual mode of transmission: Secondary | ICD-10-CM

## 2018-06-02 DIAGNOSIS — M549 Dorsalgia, unspecified: Secondary | ICD-10-CM

## 2018-06-02 DIAGNOSIS — G8929 Other chronic pain: Secondary | ICD-10-CM

## 2018-06-02 DIAGNOSIS — I7409 Other arterial embolism and thrombosis of abdominal aorta: Secondary | ICD-10-CM

## 2018-06-02 DIAGNOSIS — H6991 Unspecified Eustachian tube disorder, right ear: Secondary | ICD-10-CM

## 2018-06-02 DIAGNOSIS — Z1239 Encounter for other screening for malignant neoplasm of breast: Secondary | ICD-10-CM

## 2018-06-02 DIAGNOSIS — Z1211 Encounter for screening for malignant neoplasm of colon: Secondary | ICD-10-CM

## 2018-06-02 DIAGNOSIS — Z23 Encounter for immunization: Secondary | ICD-10-CM

## 2018-06-02 MED ORDER — SULFAMETHOXAZOLE-TRIMETHOPRIM 800-160 MG PO TABS: 1.0000 | ORAL_TABLET | Freq: Two times a day (BID) | ORAL | 0 refills | Status: AC

## 2018-06-02 MED ORDER — PREDNISONE 20 MG PO TABS: 60.0000 mg | ORAL_TABLET | Freq: Every day | ORAL | 0 refills | Status: AC

## 2018-06-02 MED ORDER — ATORVASTATIN CALCIUM 20 MG PO TABS: 20.0000 mg | ORAL_TABLET | Freq: Every day | ORAL | 1 refills | Status: DC

## 2018-06-02 MED ORDER — PSEUDOEPHEDRINE HCL ER 120 MG PO TB12: 120.0000 mg | ORAL_TABLET | Freq: Two times a day (BID) | ORAL | 0 refills | Status: AC

## 2018-06-02 MED ORDER — NAPROXEN 375 MG PO TABS: 375.0000 mg | ORAL_TABLET | Freq: Two times a day (BID) | ORAL | 0 refills | Status: DC

## 2018-06-02 MED ORDER — SERTRALINE HCL 50 MG PO TABS: 50.0000 mg | ORAL_TABLET | Freq: Every day | ORAL | 5 refills | Status: DC

## 2018-06-02 MED ORDER — FLUTICASONE PROPIONATE 50 MCG/ACT NA SUSP: 2.0000 | Freq: Every day | NASAL | 6 refills | Status: DC

## 2018-06-02 MED ORDER — LEVOCETIRIZINE DIHYDROCHLORIDE 5 MG PO TABS: 5.0000 mg | ORAL_TABLET | Freq: Every evening | ORAL | 11 refills | Status: DC

## 2018-06-02 MED FILL — FLUTICASONE PROP 50 MCG SPR: 50 | 30 days supply | Qty: 16 | Fill #0

## 2018-06-02 MED FILL — SULFAMETHOXAZOLE-TMP DS TAB: 800-160 | 7 days supply | Qty: 14 | Fill #0

## 2018-06-02 MED FILL — predniSONE 20 MG TABS: 20 | 3 days supply | Qty: 9 | Fill #0

## 2018-06-02 MED FILL — LEVOCETIRIZINE 5 MG TABLET: 5 | 30 days supply | Qty: 30 | Fill #0

## 2018-06-02 MED FILL — NAPROXEN 375 MG TABLET: 375 | 10 days supply | Qty: 20 | Fill #0

## 2018-06-02 NOTE — Patient Instructions (Addendum)
Folliculitis Folliculitis is inflammation of the hair follicles. Folliculitis most commonly occurs on the scalp, thighs, legs, back, and buttocks. However, it can occur anywhere on the body. What are the causes? This condition may be caused by:  A bacterial infection (common).  A fungal infection.  A viral infection.  Coming into contact with certain chemicals, especially oils and tars.  Shaving or waxing.  Applying greasy ointments or creams to your skin often.  Long-lasting folliculitis and folliculitis that keeps coming back can be caused by bacteria that live in the nostrils. What increases the risk? This condition is more likely to develop in people with:  A weakened immune system.  Diabetes.  Obesity.  What are the signs or symptoms? Symptoms of this condition include:  Redness.  Soreness.  Swelling.  Itching.  Small white or yellow, pus-filled, itchy spots (pustules) that appear over a reddened area. If there is an infection that goes deep into the follicle, these may develop into a boil (furuncle).  A group of closely packed boils (carbuncle). These tend to form in hairy, sweaty areas of the body.  How is this diagnosed? This condition is diagnosed with a skin exam. To find what is causing the condition, your health care provider may take a sample of one of the pustules or boils for testing. How is this treated? This condition may be treated by:  Applying warm compresses to the affected areas.  Taking an antibiotic medicine or applying an antibiotic medicine to the skin.  Applying or bathing with an antiseptic solution.  Taking an over-the-counter medicine to help with itching.  Having a procedure to drain any pustules or boils. This may be done if a pustule or boil contains a lot of pus or fluid.  Laser hair removal. This may be done to treat long-lasting folliculitis.  Follow these instructions at home:  If directed, apply heat to the affected  area as often as told by your health care provider. Use the heat source that your health care provider recommends, such as a moist heat pack or a heating pad. ? Place a towel between your skin and the heat source. ? Leave the heat on for 20-30 minutes. ? Remove the heat if your skin turns bright red. This is especially important if you are unable to feel pain, heat, or cold. You may have a greater risk of getting burned.  If you were prescribed an antibiotic medicine, use it as told by your health care provider. Do not stop using the antibiotic even if you start to feel better.  Take over-the-counter and prescription medicines only as told by your health care provider.  Do not shave irritated skin.  Keep all follow-up visits as told by your health care provider. This is important. Get help right away if:  You have more redness, swelling, or pain in the affected area.  Red streaks are spreading from the affected area.  You have a fever. This information is not intended to replace advice given to you by your health care provider. Make sure you discuss any questions you have with your health care provider. Document Released: 10/01/2001 Document Revised: 02/10/2016 Document Reviewed: 05/13/2015 Elsevier Interactive Patient Education  2018 [employer]. Eustachian Tube Dysfunction The eustachian tube connects the middle ear to the back of the nose. It regulates air pressure in the middle ear by allowing air to move between the ear and nose. It also helps to drain fluid from the middle ear space. When the eustachian  tube does not function properly, air pressure, fluid, or both can build up in the middle ear. Eustachian tube dysfunction can affect one or both ears. What are the causes? This condition happens when the eustachian tube becomes blocked or cannot open normally. This may result from:  Ear infections.  Colds and other upper respiratory infections.  Allergies.  Irritation, such  as from cigarette smoke or acid from the stomach coming up into the esophagus (gastroesophageal reflux).  Sudden changes in air pressure, such as from descending in an airplane.  Abnormal growths in the nose or throat, such as nasal polyps, tumors, or enlarged tissue at the back of the throat (adenoids).  What increases the risk? This condition may be more likely to develop in people who smoke and people who are overweight. Eustachian tube dysfunction may also be more likely to develop in children, especially children who have:  Certain birth defects of the mouth, such as cleft palate.  Large tonsils and adenoids.  What are the signs or symptoms? Symptoms of this condition may include:  A feeling of fullness in the ear.  Ear pain.  Clicking or popping noises in the ear.  Ringing in the ear.  Hearing loss.  Loss of balance.  Symptoms may get worse when the air pressure around you changes, such as when you travel to an area of high elevation or fly on an airplane. How is this diagnosed? This condition may be diagnosed based on:  Your symptoms.  A physical exam of your ear, nose, and throat.  Tests, such as those that measure: ? The movement of your eardrum (tympanogram). ? Your hearing (audiometry).  How is this treated? Treatment depends on the cause and severity of your condition. If your symptoms are mild, you may be able to relieve your symptoms by moving air into ("popping") your ears. If you have symptoms of fluid in your ears, treatment may include:  Decongestants.  Antihistamines.  Nasal sprays or ear drops that contain medicines that reduce swelling (steroids).  In some cases, you may need to have a procedure to drain the fluid in your eardrum (myringotomy). In this procedure, a small tube is placed in the eardrum to:  Drain the fluid.  Restore the air in the middle ear space.  Follow these instructions at home:  Take over-the-counter and prescription  medicines only as told by your health care provider.  Use techniques to help pop your ears as recommended by your health care provider. These may include: ? Chewing gum. ? Yawning. ? Frequent, forceful swallowing. ? Closing your mouth, holding your nose closed, and gently blowing as if you are trying to blow air out of your nose.  Do not do any of the following until your health care provider approves: ? Travel to high altitudes. ? Fly in airplanes. ? Work in a Pension scheme manager or room. ? Scuba dive.  Keep your ears dry. Dry your ears completely after showering or bathing.  Do not smoke.  Keep all follow-up visits as told by your health care provider. This is important. Contact a health care provider if:  Your symptoms do not go away after treatment.  Your symptoms come back after treatment.  You are unable to pop your ears.  You have: ? A fever. ? Pain in your ear. ? Pain in your head or neck. ? Fluid draining from your ear.  Your hearing suddenly changes.  You become very dizzy.  You lose your balance. This information is  not intended to replace advice given to you by your health care provider. Make sure you discuss any questions you have with your health care provider. Document Released: 08/19/2015 Document Revised: 12/29/2015 Document Reviewed: 08/11/2014 Elsevier Interactive Patient Education  [employer].

## 2018-06-02 NOTE — Progress Notes (Signed)
[facility]  Provider: Lanae Boast, FNP   Hypertension Follow Up Visit  SUBJECTIVE:  Brandi Molina is a 52 y.o. female who  has a past medical history of Bartholin gland cyst (2008) and Depression. .   New concerns:  Patient states that she is continuing to have right ear pain x 2 years. Patient states that she has been seen in the past for this and has not had relief. Patient reports 3 pus containing bumps on the right groin x 1 week. Patient states that she has been using warm compresses and expressing pus like drainage from the area.  She denies fever, chills, or night sweats. Would like to return for vaginal exam and pap smear.   Current Outpatient Medications  Medication Sig Dispense Refill  . acetaminophen (TYLENOL) 500 MG tablet Take 1 tablet (500 mg total) by mouth every 6 (six) hours as needed. 30 tablet 0  . aspirin 81 MG tablet Take 81 mg by mouth daily.    Marland Kitchen atorvastatin (LIPITOR) 20 MG tablet Take 1 tablet (20 mg total) by mouth daily. 90 tablet 1  . cetirizine (ZYRTEC) 10 MG tablet Take 1 tablet (10 mg total) by mouth daily. 30 tablet 11  . Cholecalciferol (VITAMIN D) 2000 units CAPS Take 1 capsule (2,000 Units total) by mouth daily. 30 capsule 5  . Multiple Vitamin (MULTIVITAMIN) tablet Take 1 tablet by mouth daily.    . naproxen (NAPROSYN) 375 MG tablet Take 1 tablet (375 mg total) by mouth 2 (two) times daily. 20 tablet 0  . Omega-3 Fatty Acids (FISH OIL) 1000 MG CAPS Take by mouth.    . sertraline (ZOLOFT) 50 MG tablet Take 1 tablet (50 mg total) by mouth daily. 30 tablet 5   Current Facility-Administered Medications  Medication Dose Route Frequency Provider Last Rate Last Dose  . lidocaine (XYLOCAINE) 0.5 % (with pres) injection 5 mL  5 mL Intradermal Once Dorena Dew, FNP        No results found for this or any previous visit (from the past 2160 hour(s)).  Hypertension ROS: Review of Systems    Constitutional: Negative.   HENT: Positive for ear pain.   Eyes: Negative.   Respiratory: Negative.   Cardiovascular: Negative.   Gastrointestinal: Negative.   Genitourinary: Negative.        Lesion x 3 to the left groin  Musculoskeletal: Negative.   Skin: Negative.   Neurological: Negative.   Psychiatric/Behavioral: Negative.      OBJECTIVE:   BP (!) 111/56 (BP Location: Left Arm, Patient Position: Sitting, Cuff Size: Normal)   Pulse 78   Temp 98.5 F (36.9 C) (Oral)   Resp 16   Ht 5\' 1"  (1.549 m)   Wt 126 lb (57.2 kg)   SpO2 99%   BMI 23.81 kg/m   Physical Exam  Constitutional: She is oriented to person, place, and time and well-developed, well-nourished, and in no distress. No distress.  HENT:  Head: Normocephalic and atraumatic.  Right Ear: Tympanic membrane is injected.  Eyes: Pupils are equal, round, and reactive to light. Conjunctivae and EOM are normal.  Neck: Normal range of motion. Neck supple.  Cardiovascular: Normal rate, regular rhythm and intact distal pulses. Exam reveals no gallop and no friction rub.  No murmur heard. Pulmonary/Chest: Effort normal and breath sounds normal. No respiratory distress. She has no wheezes.  Abdominal: Soft. Bowel sounds are normal. There is no tenderness.  Genitourinary:  Genitourinary Comments: Patient declined vaginal exam.   Musculoskeletal: Normal range of motion. She exhibits no edema or tenderness.  Lymphadenopathy:    She has no cervical adenopathy.  Neurological: She is alert and oriented to person, place, and time. Gait normal.  Skin: Skin is warm and dry.  Psychiatric: Mood, memory, affect and judgment normal.  Nursing note and vitals reviewed.    ASSESSMENT/PLAN:  1. Generalized anxiety disorder Continue medication.  - sertraline (ZOLOFT) 50 MG tablet; Take 1 tablet (50 mg total) by mouth daily.  Dispense: 30 tablet; Refill: 5  2. Aortoiliac occlusive disease (HCC) - atorvastatin (LIPITOR) 20 MG tablet;  Take 1 tablet (20 mg total) by mouth daily.  Dispense: 90 tablet; Refill: 1  3. Chronic back pain, unspecified back location, unspecified back pain laterality - naproxen (NAPROSYN) 375 MG tablet; Take 1 tablet (375 mg total) by mouth 2 (two) times daily.  Dispense: 20 tablet; Refill: 0  4. Screen for colon cancer - Ambulatory referral to Gastroenterology  5. Screen for sexually transmitted diseases - HIV antibody (with reflex)  6. Screening for breast cancer - MM Digital Screening; Future  7. Disorder of right eustachian tube Discussed using a warm compress and massaging the eustachian tube.  - levocetirizine (XYZAL) 5 MG tablet; Take 1 tablet (5 mg total) by mouth every evening.  Dispense: 30 tablet; Refill: 11 - pseudoephedrine (SUDAFED 12 HOUR) 120 MG 12 hr tablet; Take 1 tablet (120 mg total) by mouth 2 (two) times daily for 5 days.  Dispense: 10 tablet; Refill: 0 - fluticasone (FLONASE) 50 MCG/ACT nasal spray; Place 2 sprays into both nostrils daily.  Dispense: 16 g; Refill: 6 - predniSONE (DELTASONE) 20 MG tablet; Take 3 tablets (60 mg total) by mouth daily with breakfast for 3 days.  Dispense: 9 tablet; Refill: 0  8. Folliculitis Treatment is based on symptoms. Patient declined an exam today.  - sulfamethoxazole-trimethoprim (BACTRIM DS,SEPTRA DS) 800-160 MG tablet; Take 1 tablet by mouth 2 (two) times daily for 7 days.  Dispense: 14 tablet; Refill: 0   The patient is asked to make an attempt to improve diet and exercise patterns to aid in medical management of this problem.  Return to care as scheduled and prn. Patient verbalized understanding and agreed with plan of care.    Ms. Doug Sou. Nathaneil Canary, FNP-BC [facility] [address] [place], [place] [address] [phone]

## 2018-06-03 ENCOUNTER — Encounter: Payer: Self-pay | Admitting: Gastroenterology

## 2018-06-03 LAB — HIV ANTIBODY (ROUTINE TESTING W REFLEX): HIV Screen 4th Generation wRfx: NONREACTIVE

## 2018-06-09 ENCOUNTER — Other Ambulatory Visit (HOSPITAL_COMMUNITY): Payer: Self-pay | Admitting: *Deleted

## 2018-06-09 DIAGNOSIS — Z1231 Encounter for screening mammogram for malignant neoplasm of breast: Secondary | ICD-10-CM

## 2018-06-13 ENCOUNTER — Ambulatory Visit (AMBULATORY_SURGERY_CENTER): Payer: Self-pay

## 2018-06-13 VITALS — Ht 61.0 in | Wt 130.8 lb

## 2018-06-13 DIAGNOSIS — Z1211 Encounter for screening for malignant neoplasm of colon: Secondary | ICD-10-CM

## 2018-06-13 MED ORDER — PEG-KCL-NACL-NASULF-NA ASC-C 140 G PO SOLR: 1.0000 | Freq: Once | ORAL | 0 refills | Status: AC

## 2018-06-13 NOTE — Progress Notes (Signed)
Denies allergies to eggs or soy products. Denies complication of anesthesia or sedation. Denies use of weight loss medication. Denies use of O2.   Emmi instructions declined.   A sample of Plenvu was given to the patient.  

## 2018-06-17 MED FILL — ?ATORVASTATIN 20 MG TABLET: 20 | 90 days supply | Qty: 90 | Fill #0

## 2018-06-27 ENCOUNTER — Ambulatory Visit (AMBULATORY_SURGERY_CENTER): Payer: Self-pay | Admitting: Gastroenterology

## 2018-06-27 ENCOUNTER — Encounter: Payer: Self-pay | Admitting: Gastroenterology

## 2018-06-27 VITALS — BP 100/58 | HR 57 | Temp 97.8°F | Resp 20 | Ht 61.0 in | Wt 130.0 lb

## 2018-06-27 DIAGNOSIS — D123 Benign neoplasm of transverse colon: Secondary | ICD-10-CM

## 2018-06-27 DIAGNOSIS — Z1211 Encounter for screening for malignant neoplasm of colon: Secondary | ICD-10-CM

## 2018-06-27 MED ORDER — SODIUM CHLORIDE 0.9 % IV SOLN: 500.0000 mL | Freq: Once | INTRAVENOUS | Status: DC

## 2018-06-27 NOTE — Op Note (Signed)
[facility] Patient Name: Brandi Molina Procedure Date: 06/27/2018 9:15 AM MRN: [id number] Endoscopist: Mallie Mussel L. Loletha Carrow , MD Age: 52 Referring MD:  Date of Birth: Jun 13, 1966 Gender: Female Account #: [id number] Procedure:                Colonoscopy Indications:              Screening for colorectal malignant neoplasm, This                            is the patient's first colonoscopy Medicines:                Monitored Anesthesia Care Procedure:                Pre-Anesthesia Assessment:                           - Prior to the procedure, a History and Physical                            was performed, and patient medications and                            allergies were reviewed. The patient's tolerance of                            previous anesthesia was also reviewed. The risks                            and benefits of the procedure and the sedation                            options and risks were discussed with the patient.                            All questions were answered, and informed consent                            was obtained. Anticoagulants: The patient has taken                            aspirin. It was decided not to withhold this                            medication prior to the procedure. ASA Grade                            Assessment: II - A patient with mild systemic                            disease. After reviewing the risks and benefits,                            the patient was deemed in satisfactory condition to  undergo the procedure.                           After obtaining informed consent, the colonoscope                            was passed under direct vision. Throughout the                            procedure, the patient's blood pressure, pulse, and                            oxygen saturations were monitored continuously. The                            Colonoscope was introduced through the anus and                            advanced to the the cecum, identified by                            appendiceal orifice and ileocecal valve. The                            colonoscopy was performed without difficulty. The                            patient tolerated the procedure well. The quality                            of the bowel preparation was excellent. The                            ileocecal valve, appendiceal orifice, and rectum                            were photographed. The bowel preparation used was                            Plenvu. Scope In: 9:20:30 AM Scope Out: 9:34:57 AM Scope Withdrawal Time: 0 hours 12 minutes 37 seconds  Total Procedure Duration: 0 hours 14 minutes 27 seconds  Findings:                 The perianal and digital rectal examinations were                            normal.                           Melanosis was found in the entire colon.                           Two flat polyps were found in the transverse colon.  The polyps were 2 to 4 mm in size. These polyps                            were removed with a cold snare. Resection and                            retrieval were complete.                           Multiple diverticula were found in the left colon.                           The exam was otherwise without abnormality on                            direct and retroflexion views. Complications:            No immediate complications. Estimated Blood Loss:     Estimated blood loss was minimal. Impression:               - Melanosis in the colon.                           - Two 2 to 4 mm polyps in the transverse colon,                            removed with a cold snare. Resected and retrieved.                           - Diverticulosis in the left colon.                           - The examination was otherwise normal on direct                            and retroflexion views. Recommendation:           - Patient has a  contact number available for                            emergencies. The signs and symptoms of potential                            delayed complications were discussed with the                            patient. Return to normal activities tomorrow.                            Written discharge instructions were provided to the                            patient.                           - Resume previous  diet.                           - Continue present medications.                           - Await pathology results.                           - Repeat colonoscopy is recommended for                            surveillance. The colonoscopy date will be                            determined after pathology results from today's                            exam become available for review. Cliford Sequeira L. Loletha Carrow, MD 06/27/2018 9:39:52 AM This report has been signed electronically.

## 2018-06-27 NOTE — Progress Notes (Signed)
Pt's states no medical or surgical changes since previsit or office visit. 

## 2018-06-27 NOTE — Progress Notes (Signed)
Called to room to assist during endoscopic procedure.  Patient ID and intended procedure confirmed with present staff. Received instructions for my participation in the procedure from the performing physician.  

## 2018-06-27 NOTE — Progress Notes (Signed)
PT taken to PACU. Monitors in place. VSS. Report given to RN. 

## 2018-06-27 NOTE — Patient Instructions (Signed)
Impression/Recommendations:  Polyp handout given to patient. Diverticulosis handout given to patient.  Resume previous diet. Continue present medications.  Repeat colonoscopy recommended for surveillance.  Date to be determined after pathology results reviewed.  YOU HAD AN ENDOSCOPIC PROCEDURE TODAY AT THE [facility]:   Refer to the procedure report that was given to you for any specific questions about what was found during the examination.  If the procedure report does not answer your questions, please call your gastroenterologist to clarify.  If you requested that your care partner not be given the details of your procedure findings, then the procedure report has been included in a sealed envelope for you to review at your convenience later.  YOU SHOULD EXPECT: Some feelings of bloating in the abdomen. Passage of more gas than usual.  Walking can help get rid of the air that was put into your GI tract during the procedure and reduce the bloating. If you had a lower endoscopy (such as a colonoscopy or flexible sigmoidoscopy) you may notice spotting of blood in your stool or on the toilet paper. If you underwent a bowel prep for your procedure, you may not have a normal bowel movement for a few days.  Please Note:  You might notice some irritation and congestion in your nose or some drainage.  This is from the oxygen used during your procedure.  There is no need for concern and it should clear up in a day or so.  SYMPTOMS TO REPORT IMMEDIATELY:   Following lower endoscopy (colonoscopy or flexible sigmoidoscopy):  Excessive amounts of blood in the stool  Significant tenderness or worsening of abdominal pains  Swelling of the abdomen that is new, acute  Fever of 100F or higher  For urgent or emergent issues, a gastroenterologist can be reached at any hour by calling [phone].   DIET:  We do recommend a small meal at first, but then you may proceed to your regular diet.   Drink plenty of fluids but you should avoid alcoholic beverages for 24 hours.  ACTIVITY:  You should plan to take it easy for the rest of today and you should NOT DRIVE or use heavy machinery until tomorrow (because of the sedation medicines used during the test).    FOLLOW UP: Our staff will call the number listed on your records the next business day following your procedure to check on you and address any questions or concerns that you may have regarding the information given to you following your procedure. If we do not reach you, we will leave a message.  However, if you are feeling well and you are not experiencing any problems, there is no need to return our call.  We will assume that you have returned to your regular daily activities without incident.  If any biopsies were taken you will be contacted by phone or by letter within the next 1-3 weeks.  Please call us at [phone] if you have not heard about the biopsies in 3 weeks.    SIGNATURES/CONFIDENTIALITY: You and/or your care partner have signed paperwork which will be entered into your electronic medical record.  These signatures attest to the fact that that the information above on your After Visit Summary has been reviewed and is understood.  Full responsibility of the confidentiality of this discharge information lies with you and/or your care-partner. 

## 2018-06-30 ENCOUNTER — Telehealth: Payer: Self-pay | Admitting: *Deleted

## 2018-06-30 ENCOUNTER — Ambulatory Visit (INDEPENDENT_AMBULATORY_CARE_PROVIDER_SITE_OTHER): Payer: Self-pay | Admitting: Family Medicine

## 2018-06-30 ENCOUNTER — Encounter: Payer: Self-pay | Admitting: Family Medicine

## 2018-06-30 VITALS — BP 117/63 | HR 82 | Temp 97.6°F | Resp 16 | Ht 61.0 in | Wt 126.0 lb

## 2018-06-30 DIAGNOSIS — L739 Follicular disorder, unspecified: Secondary | ICD-10-CM

## 2018-06-30 DIAGNOSIS — Z01419 Encounter for gynecological examination (general) (routine) without abnormal findings: Secondary | ICD-10-CM

## 2018-06-30 MED ORDER — CEPHALEXIN 500 MG PO CAPS: 500.0000 mg | ORAL_CAPSULE | Freq: Two times a day (BID) | ORAL | 0 refills | Status: AC

## 2018-06-30 MED ORDER — NYSTATIN 100000 UNIT/GM EX CREA: 1.0000 "application " | TOPICAL_CREAM | Freq: Two times a day (BID) | CUTANEOUS | 0 refills | Status: DC

## 2018-06-30 MED FILL — CEPHALEXIN 500 MG CAPSULE: 500 | 7 days supply | Qty: 14 | Fill #0

## 2018-06-30 MED FILL — LEVOCETIRIZINE 5 MG TABLET: 5 | 30 days supply | Qty: 30 | Fill #1

## 2018-06-30 MED FILL — NYSTATIN 100,000 UNIT/GM CR: 100000 | 15 days supply | Qty: 30 | Fill #0

## 2018-06-30 NOTE — Patient Instructions (Signed)

## 2018-06-30 NOTE — Telephone Encounter (Signed)
  Follow up Call-  Call back number 06/27/2018  Post procedure Call Back phone  # [phone]  Permission to leave phone message Yes  Some recent data might be hidden     Patient questions:  Do you have a fever, pain , or abdominal swelling? No. Pain Score  0 *  Have you tolerated food without any problems? Yes.    Have you been able to return to your normal activities? Yes.    Do you have any questions about your discharge instructions: Diet   No. Medications  No. Follow up visit  No.  Do you have questions or concerns about your Care? No.  Actions: * If pain score is 4 or above: No action needed, pain <4.

## 2018-06-30 NOTE — Progress Notes (Signed)
   Patient [facility]  Annual GYN Examination Provider: Lanae Boast, FNP   SUBJECTIVE: JESSY CALIXTE is a 52 y.o. female who presents for her annual gynecological examination.  Current concerns:  Patient with a bump to the left labia that is itchy. No exudate noted at the present time.     GYNECOLOGICAL HISTORY: Patient's last menstrual period was 09/20/2013. Contraception: condoms Last Pap: unknown.  Last mammogram:unknown. Patient scheduled in 2020  OBSTETRIC HISTORY: OB History  Gravida Para Term Preterm AB Living  2 1 1   1 1   SAB TAB Ectopic Multiple Live Births    1          # Outcome Date GA Lbr Len/2nd Weight Sex Delivery Anes PTL Lv  2 Term           1 TAB              The following portions of the patient's history were reviewed and updated as appropriate: allergies, current medications, past family history, past medical history, past social history, past surgical history and problem list.  REVIEW OF SYSTEMS: Review of Systems  Constitutional: Negative.   HENT: Negative.   Eyes: Negative.   Respiratory: Negative.   Cardiovascular: Negative.   Gastrointestinal: Negative.   Genitourinary: Negative.        Bump and itching  Musculoskeletal: Negative.   Skin: Negative.   Neurological: Negative.   Psychiatric/Behavioral: Negative.       OBJECTIVE:  Physical Exam  Constitutional: She is oriented to person, place, and time. She appears well-developed and well-nourished. No distress.  Genitourinary:     Eyes: Pupils are equal, round, and reactive to light. EOM are normal.  Neck: Normal range of motion.  Cardiovascular: Normal rate and regular rhythm.  Pulmonary/Chest: Effort normal. No respiratory distress.  Abdominal: Soft. Bowel sounds are normal.  Musculoskeletal: Normal range of motion.  Neurological: She is alert and oriented to person, place, and time.  Skin: Skin is warm and dry.  Nursing note  and vitals reviewed.    ASSESSMENT/PLAN:  1. Well woman exam with routine gynecological exam Mild erythema consistent with candida.  - Pap IG, CT/NG NAA, and HPV (high risk) [facility] - nystatin cream (MYCOSTATIN); Apply 1 application topically 2 (two) times daily.  Dispense: 30 g; Refill: 0  2. Folliculitis Discussed vaginal hygiene.  - cephALEXin (KEFLEX) 500 MG capsule; Take 1 capsule (500 mg total) by mouth 2 (two) times daily for 7 days.  Dispense: 14 capsule; Refill: 0    Education reviewed: depression evaluation, safe sex/STD prevention, self breast exams, skin cancer screening and smoking cessation. Follow up in: 6 months.    Return to care as scheduled and prn. Patient verbalized understanding and agreed with plan of care.   Ms. Doug Sou. Nathaneil Canary, FNP-BC [facility] [address] [place], [place] [address] [phone]

## 2018-07-03 LAB — PAP IG, CT-NG NAA, HPV HIGH-RISK
Chlamydia, Nuc. Acid Amp: NEGATIVE
Gonococcus by Nucleic Acid Amp: NEGATIVE
HPV, high-risk: NEGATIVE

## 2018-07-07 ENCOUNTER — Encounter: Payer: Self-pay | Admitting: Gastroenterology

## 2018-07-07 ENCOUNTER — Encounter: Payer: Self-pay | Admitting: Family Medicine

## 2018-07-07 NOTE — Progress Notes (Signed)
Please print this lab letter and send to [employer]

## 2018-08-06 HISTORY — PX: BREAST BIOPSY: SHX20

## 2018-09-18 ENCOUNTER — Encounter (HOSPITAL_COMMUNITY): Payer: Self-pay

## 2018-09-18 ENCOUNTER — Ambulatory Visit (HOSPITAL_COMMUNITY)
Admission: RE | Admit: 2018-09-18 | Discharge: 2018-09-18 | Disposition: A | Discharge status: Home / Self Care | Payer: Self-pay | Source: Ambulatory Visit | Attending: Obstetrics and Gynecology | Admitting: Obstetrics and Gynecology

## 2018-09-18 ENCOUNTER — Ambulatory Visit
Admission: RE | Admit: 2018-09-18 | Discharge: 2018-09-18 | Disposition: A | Discharge status: Home / Self Care | Payer: No Typology Code available for payment source | Source: Ambulatory Visit | Attending: Obstetrics and Gynecology | Admitting: Obstetrics and Gynecology

## 2018-09-18 ENCOUNTER — Ambulatory Visit: Payer: Self-pay | Attending: Family Medicine

## 2018-09-18 VITALS — BP 110/78 | Wt 127.0 lb

## 2018-09-18 DIAGNOSIS — Z1239 Encounter for other screening for malignant neoplasm of breast: Secondary | ICD-10-CM

## 2018-09-18 DIAGNOSIS — Z1231 Encounter for screening mammogram for malignant neoplasm of breast: Secondary | ICD-10-CM

## 2018-09-18 NOTE — Progress Notes (Signed)
No complaints today.   Pap Smear: Pap smear not completed today. Last Pap smear was 06/30/2018 at  and normal with negative HPV. Per patient has a history of an abnormal Pap smear 25 years ago that cryotherapy was completed for follow-up. Patient stated she has had at least three normal Pap smears since the cryotherapy was completed. Last Pap smear result is in Epic.  Physical exam: Breasts Breasts symmetrical. No skin abnormalities bilateral breasts. No nipple retraction bilateral breasts. No nipple discharge bilateral breasts. No lymphadenopathy. No lumps palpated bilateral breasts. Complaints of mild right outer breast tenderness on exam. Referred patient to the [facility] for a screening mammogram. Appointment scheduled for Thursday, September 18, 2018 at 1510.        Pelvic/Bimanual No Pap smear completed today since last Pap smear and HPV typing was 06/30/2018. Pap smear not indicated per BCCCP guidelines.   Smoking History: Patient is a current smoker. Discussed smoking cessation with patient. Referred to the [facility] Quitline and gave resources to the free smoking cessation classes at [facility]  Patient Navigation: Patient education provided. Access to services provided for patient through [facility] program.   Colorectal Cancer Screening: Patient had a colonoscopy completed 06/27/2018. No complaints today.   Breast and Cervical Cancer Risk Assessment: Patient has no family history of breast cancer, known genetic mutations, or radiation treatment to the chest before age 13. Per patient has a history of cervical dysplasia. Patient has no history of being immunocompromised or DES exposure in-utero.  Risk Assessment    Risk Scores      09/18/2018   Last edited by: Armond Hang, LPN   5-year risk: 1.1 %   Lifetime risk: 8.8 %

## 2018-09-18 NOTE — Patient Instructions (Signed)
Explained breast self awareness with Jeneal O Lees. Patient did not need a Pap smear today due to last Pap smear and HPV typing was 06/30/2018. Let her know BCCCP will cover Pap smears and HPV typing every 5 years unless has a history of abnormal Pap smears. Referred patient to the [facility] for a screening mammogram. Appointment scheduled for Thursday, September 18, 2018 at 1510. Patient aware of appointment and will be there. Let patient know [facility] will follow up with her within the next couple weeks with results of mammogram by letter or phone. Discussed smoking cessation with patient. Referred to the [facility] Quitline and gave resources to the free smoking cessation classes at [facility]. [place] verbalized understanding.  Sammy Cassar, Arvil Chaco, RN 2:13 PM

## 2018-09-19 ENCOUNTER — Other Ambulatory Visit: Payer: Self-pay | Admitting: Obstetrics and Gynecology

## 2018-09-19 ENCOUNTER — Encounter (HOSPITAL_COMMUNITY): Payer: Self-pay | Admitting: *Deleted

## 2018-09-19 DIAGNOSIS — R928 Other abnormal and inconclusive findings on diagnostic imaging of breast: Secondary | ICD-10-CM

## 2018-09-25 ENCOUNTER — Other Ambulatory Visit: Payer: Self-pay | Admitting: Obstetrics and Gynecology

## 2018-09-25 ENCOUNTER — Ambulatory Visit
Admission: RE | Admit: 2018-09-25 | Discharge: 2018-09-25 | Disposition: A | Discharge status: Home / Self Care | Payer: No Typology Code available for payment source | Source: Ambulatory Visit | Attending: Obstetrics and Gynecology | Admitting: Obstetrics and Gynecology

## 2018-09-25 DIAGNOSIS — R928 Other abnormal and inconclusive findings on diagnostic imaging of breast: Secondary | ICD-10-CM

## 2018-09-30 ENCOUNTER — Ambulatory Visit
Admission: RE | Admit: 2018-09-30 | Discharge: 2018-09-30 | Disposition: A | Discharge status: Home / Self Care | Payer: No Typology Code available for payment source | Source: Ambulatory Visit | Attending: Obstetrics and Gynecology | Admitting: Obstetrics and Gynecology

## 2018-09-30 ENCOUNTER — Ambulatory Visit
Admission: RE | Admit: 2018-09-30 | Discharge: 2018-09-30 | Disposition: A | Discharge status: Home / Self Care | Payer: Self-pay | Source: Ambulatory Visit | Attending: Obstetrics and Gynecology | Admitting: Obstetrics and Gynecology

## 2018-09-30 ENCOUNTER — Other Ambulatory Visit: Payer: Self-pay | Admitting: Obstetrics and Gynecology

## 2018-09-30 DIAGNOSIS — R928 Other abnormal and inconclusive findings on diagnostic imaging of breast: Secondary | ICD-10-CM

## 2018-12-30 ENCOUNTER — Telehealth: Payer: Self-pay

## 2018-12-30 NOTE — Telephone Encounter (Signed)
Called, Patient not available. Left a message with husband for patient to call back to our office regarding appointment tomorrow.

## 2018-12-31 ENCOUNTER — Ambulatory Visit: Payer: Self-pay | Admitting: Family Medicine

## 2019-01-23 ENCOUNTER — Ambulatory Visit (INDEPENDENT_AMBULATORY_CARE_PROVIDER_SITE_OTHER): Payer: Self-pay | Admitting: Family Medicine

## 2019-01-23 ENCOUNTER — Other Ambulatory Visit: Payer: Self-pay

## 2019-01-23 ENCOUNTER — Encounter: Payer: Self-pay | Admitting: Family Medicine

## 2019-01-23 VITALS — BP 116/59 | HR 96 | Temp 98.3°F | Resp 14 | Ht 61.0 in | Wt 122.0 lb

## 2019-01-23 DIAGNOSIS — H6691 Otitis media, unspecified, right ear: Secondary | ICD-10-CM

## 2019-01-23 MED ORDER — AMOXICILLIN 500 MG PO CAPS: 500.0000 mg | ORAL_CAPSULE | Freq: Three times a day (TID) | ORAL | 0 refills | Status: DC

## 2019-01-23 NOTE — Progress Notes (Signed)
  Patient [facility]   Progress Note: Sick Visit Provider: Lanae Boast, FNP  SUBJECTIVE:   Brandi Molina is a 53 y.o. female who  has a past medical history of Allergy, Anxiety, Bartholin gland cyst (2008), Depression, and Hyperlipidemia.. Patient presents today for Ear Pain (right ) Otalgia  There is pain in the right ear. This is a new problem. The current episode started 1 to 4 weeks ago. The problem occurs constantly. The problem has been gradually worsening. There has been no fever. The pain is moderate. Associated symptoms include neck pain and a sore throat (pain with swallowing). She has tried nothing for the symptoms. There is no history of a chronic ear infection, hearing loss or a tympanostomy tube.    Review of Systems  HENT: Positive for ear pain and sore throat (pain with swallowing).   Musculoskeletal: Positive for neck pain.  All other systems reviewed and are negative.    OBJECTIVE: BP (!) 116/59 (BP Location: Left Arm, Patient Position: Sitting, Cuff Size: Normal)   Pulse 96   Temp 98.3 F (36.8 C) (Oral)   Resp 14   Ht 5\' 1"  (1.549 m)   Wt 122 lb (55.3 kg)   LMP 09/20/2013   SpO2 99%   BMI 23.05 kg/m   Wt Readings from Last 3 Encounters:  01/23/19 122 lb (55.3 kg)  09/18/18 127 lb (57.6 kg)  06/30/18 126 lb (57.2 kg)     Physical Exam Constitutional:      General: She is not in acute distress.    Appearance: Normal appearance.  HENT:     Head: Normocephalic and atraumatic.     Right Ear: Tenderness present. Tympanic membrane is erythematous.     Left Ear: Tympanic membrane normal.  Neurological:     Mental Status: She is alert and oriented to person, place, and time.  Psychiatric:        Mood and Affect: Mood normal.        Behavior: Behavior normal.        Thought Content: Thought content normal.        Judgment: Judgment normal.     ASSESSMENT/PLAN:  1. Right otitis media, unspecified otitis  media type Otc pain meds and warm compress recommended. RTC PRN - amoxicillin (AMOXIL) 500 MG capsule; Take 1 capsule (500 mg total) by mouth 3 (three) times daily.  Dispense: 30 capsule; Refill: 0         The patient was given clear instructions to go to ER or return to medical center if symptoms do not improve, worsen or new problems develop. The patient verbalized understanding and agreed with plan of care.   Ms. Doug Sou. Nathaneil Canary, FNP-BC [facility] [address] [place], [place] [address] [phone]     This note has been created with Dragon speech recognition software and smart phrase technology. Any transcriptional errors are unintentional.

## 2019-01-23 NOTE — Patient Instructions (Signed)

## 2019-03-16 ENCOUNTER — Telehealth: Payer: Self-pay | Admitting: Family Medicine

## 2019-03-16 ENCOUNTER — Other Ambulatory Visit: Payer: Self-pay

## 2019-03-16 ENCOUNTER — Ambulatory Visit: Payer: Self-pay | Attending: Family Medicine

## 2019-03-16 NOTE — Telephone Encounter (Signed)
I call Pt since she has TELE visit appt with financial, unable to LVM since is busy and no VM, I will try again in10 minutes

## 2019-04-09 ENCOUNTER — Telehealth: Payer: Self-pay | Admitting: Family Medicine

## 2019-04-09 NOTE — Telephone Encounter (Signed)
Pt was sent a letter from financial dept. Inform them, that the application they submitted was incomplete, since they were missing some documentation at the time of the appointment, Pt need to reschedule and resubmit all new papers and application for [employer] and [place], P.S. old documents has been sent back to Pt and need to make a new appt °

## 2019-04-15 ENCOUNTER — Encounter (HOSPITAL_COMMUNITY): Payer: Self-pay

## 2019-04-15 ENCOUNTER — Encounter (HOSPITAL_COMMUNITY): Payer: Self-pay | Admitting: *Deleted

## 2019-06-05 ENCOUNTER — Other Ambulatory Visit: Payer: Self-pay

## 2019-06-05 ENCOUNTER — Ambulatory Visit: Payer: Self-pay | Attending: Family Medicine

## 2019-06-08 ENCOUNTER — Encounter: Payer: Self-pay | Admitting: Family Medicine

## 2019-06-08 ENCOUNTER — Other Ambulatory Visit: Payer: Self-pay

## 2019-06-08 ENCOUNTER — Ambulatory Visit (INDEPENDENT_AMBULATORY_CARE_PROVIDER_SITE_OTHER): Payer: Self-pay | Admitting: Family Medicine

## 2019-06-08 VITALS — BP 107/60 | HR 66 | Temp 97.3°F | Ht 61.0 in | Wt 123.2 lb

## 2019-06-08 DIAGNOSIS — G8929 Other chronic pain: Secondary | ICD-10-CM

## 2019-06-08 DIAGNOSIS — Z0000 Encounter for general adult medical examination without abnormal findings: Secondary | ICD-10-CM

## 2019-06-08 DIAGNOSIS — E559 Vitamin D deficiency, unspecified: Secondary | ICD-10-CM

## 2019-06-08 DIAGNOSIS — H9201 Otalgia, right ear: Secondary | ICD-10-CM

## 2019-06-08 DIAGNOSIS — Z09 Encounter for follow-up examination after completed treatment for conditions other than malignant neoplasm: Secondary | ICD-10-CM

## 2019-06-08 DIAGNOSIS — I7409 Other arterial embolism and thrombosis of abdominal aorta: Secondary | ICD-10-CM

## 2019-06-08 DIAGNOSIS — F4321 Adjustment disorder with depressed mood: Secondary | ICD-10-CM

## 2019-06-08 DIAGNOSIS — Z01419 Encounter for gynecological examination (general) (routine) without abnormal findings: Secondary | ICD-10-CM

## 2019-06-08 DIAGNOSIS — F411 Generalized anxiety disorder: Secondary | ICD-10-CM

## 2019-06-08 DIAGNOSIS — J301 Allergic rhinitis due to pollen: Secondary | ICD-10-CM

## 2019-06-08 DIAGNOSIS — H6991 Unspecified Eustachian tube disorder, right ear: Secondary | ICD-10-CM

## 2019-06-08 DIAGNOSIS — N751 Abscess of Bartholin's gland: Secondary | ICD-10-CM

## 2019-06-08 DIAGNOSIS — M549 Dorsalgia, unspecified: Secondary | ICD-10-CM

## 2019-06-08 MED ORDER — NYSTATIN 100000 UNIT/GM EX CREA: 1.0000 "application " | TOPICAL_CREAM | Freq: Two times a day (BID) | CUTANEOUS | 3 refills | Status: DC

## 2019-06-08 MED ORDER — FLUTICASONE PROPIONATE 50 MCG/ACT NA SUSP: 2.0000 | Freq: Every day | NASAL | 6 refills | Status: DC

## 2019-06-08 MED ORDER — VITAMIN D 50 MCG (2000 UT) PO CAPS: 1.0000 | ORAL_CAPSULE | Freq: Every day | ORAL | 6 refills | Status: AC

## 2019-06-08 MED ORDER — SULFAMETHOXAZOLE-TRIMETHOPRIM 800-160 MG PO TABS: 1.0000 | ORAL_TABLET | Freq: Two times a day (BID) | ORAL | 0 refills | Status: DC

## 2019-06-08 MED ORDER — SERTRALINE HCL 50 MG PO TABS: 50.0000 mg | ORAL_TABLET | Freq: Every day | ORAL | 6 refills | Status: DC

## 2019-06-08 MED ORDER — CETIRIZINE HCL 10 MG PO TABS: 10.0000 mg | ORAL_TABLET | Freq: Every day | ORAL | 11 refills | Status: DC

## 2019-06-08 MED ORDER — ATORVASTATIN CALCIUM 20 MG PO TABS: 20.0000 mg | ORAL_TABLET | Freq: Every day | ORAL | 6 refills | Status: DC

## 2019-06-08 MED ORDER — NAPROXEN 375 MG PO TABS: 375.0000 mg | ORAL_TABLET | Freq: Two times a day (BID) | ORAL | 6 refills | Status: DC

## 2019-06-08 MED FILL — ?ATORVASTATIN 20 MG TABLET: 20 | 30 days supply | Qty: 30 | Fill #0

## 2019-06-08 MED FILL — ?CETIRIZINE HCL 10 MG TABLE: 10 | 30 days supply | Qty: 30 | Fill #0

## 2019-06-08 MED FILL — NYSTATIN 100,000 UNIT/GM CR: 100000 | 15 days supply | Qty: 30 | Fill #0

## 2019-06-08 MED FILL — ?SULFAMETHOXAZOLE-TMP DS TB: 800-160 | 10 days supply | Qty: 20 | Fill #0

## 2019-06-08 MED FILL — NAPROXEN 375 MG TABLET: 375 | 15 days supply | Qty: 30 | Fill #0

## 2019-06-08 MED FILL — FLUTICASONE PROP 50 MCG SPR: 50 | 30 days supply | Qty: 16 | Fill #0

## 2019-06-08 MED FILL — SERTRALINE HCL 50 MG TABS: 50 | 30 days supply | Qty: 30 | Fill #0

## 2019-06-08 NOTE — Patient Instructions (Signed)
Sulfamethoxazole; Trimethoprim, SMX-TMP tablets What is this medicine? SULFAMETHOXAZOLE; TRIMETHOPRIM or SMX-TMP (suhl fuh meth OK suh zohl; trye METH oh prim) is a combination of a sulfonamide antibiotic and a second antibiotic, trimethoprim. It is used to treat or prevent certain kinds of bacterial infections. It will not work for colds, flu, or other viral infections. This medicine may be used for other purposes; ask your health care provider or pharmacist if you have questions. COMMON BRAND NAME(S): Bacter-Aid DS, Bactrim, Bactrim DS, Septra, Septra DS What should I tell my health care provider before I take this medicine? They need to know if you have any of these conditions:  anemia  asthma  being treated with anticonvulsants  if you frequently drink alcohol containing drinks  kidney disease  liver disease  low level of folic acid or VELFYBO-1-BPZWCHENI dehydrogenase  poor nutrition or malabsorption  porphyria  severe allergies  thyroid disorder  an unusual or allergic reaction to sulfamethoxazole, trimethoprim, sulfa drugs, other medicines, foods, dyes, or preservatives  pregnant or trying to get pregnant  breast-feeding How should I use this medicine? Take this medicine by mouth with a full glass of water. Follow the directions on the prescription label. Take your medicine at regular intervals. Do not take it more often than directed. Do not skip doses or stop your medicine early. Talk to your pediatrician regarding the use of this medicine in children. Special care may be needed. This medicine has been used in children as young as 92 months of age. Overdosage: If you think you have taken too much of this medicine contact a poison control center or emergency room at once. NOTE: This medicine is only for you. Do not share this medicine with others. What if I miss a dose? If you miss a dose, take it as soon as you can. If it is almost time for your next dose, take only  that dose. Do not take double or extra doses. What may interact with this medicine? Do not take this medicine with any of the following medications:  aminobenzoate potassium  dofetilide  metronidazole This medicine may also interact with the following medications:  ACE inhibitors like benazepril, enalapril, lisinopril, and ramipril  birth control pills  cyclosporine  digoxin  diuretics  indomethacin  medicines for diabetes  methenamine  methotrexate  phenytoin  potassium supplements  pyrimethamine  sulfinpyrazone  tricyclic antidepressants  warfarin This list may not describe all possible interactions. Give your health care provider a list of all the medicines, herbs, non-prescription drugs, or dietary supplements you use. Also tell them if you smoke, drink alcohol, or use illegal drugs. Some items may interact with your medicine. What should I watch for while using this medicine? Tell your doctor or health care professional if your symptoms do not improve. Drink several glasses of water a day to reduce the risk of kidney problems. Do not treat diarrhea with over the counter products. Contact your doctor if you have diarrhea that lasts more than 2 days or if it is severe and watery. This medicine can make you more sensitive to the sun. Keep out of the sun. If you cannot avoid being in the sun, wear protective clothing and use a sunscreen. Do not use sun lamps or tanning beds/booths. What side effects may I notice from receiving this medicine? Side effects that you should report to your doctor or health care professional as soon as possible:  allergic reactions like skin rash or hives, swelling of the face, lips,  or tongue  breathing problems  fever or chills, sore throat  irregular heartbeat, chest pain  joint or muscle pain  pain or difficulty passing urine  red pinpoint spots on skin  redness, blistering, peeling or loosening of the skin, including  inside the mouth  unusual bleeding or bruising  unusually weak or tired  yellowing of the eyes or skin Side effects that usually do not require medical attention (report to your doctor or health care professional if they continue or are bothersome):  diarrhea  dizziness  headache  loss of appetite  nausea, vomiting  nervousness This list may not describe all possible side effects. Call your doctor for medical advice about side effects. You may report side effects to FDA at 1-800-FDA-1088. Where should I keep my medicine? Keep out of the reach of children. Store at room temperature between 20 to 25 degrees C (68 to 77 degrees F). Protect from light. Throw away any unused medicine after the expiration date. NOTE: This sheet is a summary. It may not cover all possible information. If you have questions about this medicine, talk to your doctor, pharmacist, or health care provider.  2020 Elsevier/Gold Standard (2013-02-27 14:38:26) Bartholin's Cyst  A Bartholin's cyst is a fluid-filled sac that forms on a Bartholin's gland. Bartholin's glands are small glands in the folds of skin around the opening of the vagina (labia). This type of cyst causes a bulge or lump near the lower opening of the vagina. If you have a cyst that is small and not infected, you may be able to take care of it at home. If your cyst gets infected, it may cause pain and your doctor may need to drain it. An infected Bartholin's cyst is called a Bartholin's abscess. Follow these instructions at home: Medicines  Take over-the-counter and prescription medicines only as told by your doctor.  If you were prescribed an antibiotic medicine, take it as told by your doctor. Do not stop taking the antibiotic even if you start to feel better. Managing pain and swelling  Try sitz baths to help with pain and swelling. A sitz bath is a warm water bath in which the water only comes up to your hips and should cover your  buttocks. You may take sitz baths a few times a day.  Put heat on the affected area as often as needed. Use the heat source that your doctor recommends, such as a moist heat pack or a heating pad. ? Place a towel between your skin and the heat source. ? Leave the heat on for 20-30 minutes. ? Remove the heat if your skin turns bright red. This is especially important if you cannot feel pain, heat, or cold. You may have a greater risk of getting burned. General instructions  If your cyst or abscess was drained: ? Follow instructions from your doctor about how to take care of your wound. ? Use feminine pads to absorb any fluid.  Do not push on or squeeze your cyst.  Do not have sex until the cyst has gone away or your wound from drainage has healed.  Take these steps to help prevent a Bartholin's cyst from returning, and to prevent other Bartholin's cysts from forming: ? Take a bath or shower once a day. Clean your vaginal area with mild soap and water when you bathe. ? Practice safe sex to prevent STIs (sexually transmitted infections). Talk with your doctor about how to prevent STIs and which forms of birth control (contraception)  may be best for you.  Keep all follow-up visits as told by your doctor. This is important. Contact a doctor if:  You have a fever.  You get redness, swelling, or pain around your cyst.  You have fluid, blood, pus, or a bad smell coming from your cyst.  You have a cyst that gets larger or comes back. Summary  A Bartholin's cyst is a fluid-filled sac that forms on a Bartholin's gland. These small glands are found in the folds of skin around the opening of the vagina (labia).  This type of cyst causes a bulge or lump near the lower opening of the vagina. An infected Bartholin's cyst is called a Bartholin's abscess.  Try sitz baths a few times a day to help with pain and swelling.  Do not push on or squeeze your cyst. This information is not intended to  replace advice given to you by your health care provider. Make sure you discuss any questions you have with your health care provider. Document Released: 10/19/2008 Document Revised: 05/15/2018 Document Reviewed: 04/24/2017 Elsevier Patient Education  2020 [employer].

## 2019-06-08 NOTE — Progress Notes (Signed)
Patient Brandi Molina   Sick Visit  Subjective:  Patient ID: Brandi Molina, female    DOB: [birth year]  Age: 53 y.o. MRN: [id number]  CC:  Chief Complaint  Patient presents with  . Vaginal Pain    pain, swelling, onset 7 days ago    HPI Brandi Molina is a 53 year old female who presents for Sick Visit today.   Past Medical History:  Diagnosis Date  . Allergy   . Anxiety   . Bartholin gland cyst 2008  . Depression   . Hyperlipidemia    Current Status: This will be her initial office visit with me. She was previously seeing Lanae Boast, NP for her PCP needs. She has c/o 'abscess' in her genital area X  7 days. She has a history of Bartholin's Cyst and has had Incision and Drainage in 2008 at the [facility]. She denies fevers, chills, fatigue, recent infections, weight loss, and night sweats. She has not had any headaches, visual changes, dizziness, and falls. No chest pain, heart palpitations, cough and shortness of breath reported. No reports of GI problems such as nausea, vomiting, diarrhea, and constipation. She has no reports of blood in stools, dysuria and hematuria. Her anxiety is moderate today, as her husband recently died. She denies suicidal ideations, homicidal ideations, or auditory hallucinations. She denies pain today.   Past Surgical History:  Procedure Laterality Date  . CHOLECYSTECTOMY    . CHOLECYSTECTOMY  1999    Family History  Problem Relation Age of Onset  . Diabetes Mother   . Heart disease Mother   . Heart attack Mother   . AAA (abdominal aortic aneurysm) Mother   . COPD Mother   . Cancer Father        lung  . Hypertension Brother   . Anesthesia problems Neg Hx   . Hypotension Neg Hx   . Malignant hyperthermia Neg Hx   . Pseudochol deficiency Neg Hx   . Colon cancer Neg Hx   . Stomach cancer Neg Hx   . Rectal cancer Neg Hx   . Esophageal cancer Neg Hx   . Breast cancer Neg Hx     Social  History   Socioeconomic History  . Marital status: Married    Spouse name: Not on file  . Number of children: 1  . Years of education: Not on file  . Highest education level: 12th grade  Occupational History  . Not on file  Social Needs  . Financial resource strain: Not on file  . Food insecurity    Worry: Not on file    Inability: Not on file  . Transportation needs    Medical: No    Non-medical: No  Tobacco Use  . Smoking status: Current Every Day Smoker    Packs/day: 0.50    Years: 20.00    Pack years: 10.00    Types: Cigarettes  . Smokeless tobacco: Never Used  Substance and Sexual Activity  . Alcohol use: No    Alcohol/week: 0.0 standard drinks  . Drug use: No  . Sexual activity: Yes  Lifestyle  . Physical activity    Days per week: Not on file    Minutes per session: Not on file  . Stress: Not on file  Relationships  . Social Herbalist on phone: Not on file    Gets together: Not on file    Attends religious service: Not on  file    Active member of club or organization: Not on file    Attends meetings of clubs or organizations: Not on file    Relationship status: Not on file  . Intimate partner violence    Fear of current or ex partner: Not on file    Emotionally abused: Not on file    Physically abused: Not on file    Forced sexual activity: Not on file  Other Topics Concern  . Not on file  Social History Narrative  . Not on file    Outpatient Medications Prior to Visit  Medication Sig Dispense Refill  . acetaminophen (TYLENOL) 500 MG tablet Take 1 tablet (500 mg total) by mouth every 6 (six) hours as needed. 30 tablet 0  . aspirin 81 MG tablet Take 81 mg by mouth daily.    . Multiple Vitamin (MULTIVITAMIN) tablet Take 1 tablet by mouth daily.    . Omega-3 Fatty Acids (FISH OIL) 1000 MG CAPS Take by mouth.    . Cholecalciferol (VITAMIN D) 2000 units CAPS Take 1 capsule (2,000 Units total) by mouth daily. 30 capsule 5  . naproxen (NAPROSYN)  375 MG tablet Take 1 tablet (375 mg total) by mouth 2 (two) times daily. 20 tablet 0  . sertraline (ZOLOFT) 50 MG tablet Take 1 tablet (50 mg total) by mouth daily. 30 tablet 5  . amoxicillin (AMOXIL) 500 MG capsule Take 1 capsule (500 mg total) by mouth 3 (three) times daily. 30 capsule 0  . atorvastatin (LIPITOR) 20 MG tablet Take 1 tablet (20 mg total) by mouth daily. (Patient not taking: Reported on 01/23/2019) 90 tablet 1  . cetirizine (ZYRTEC) 10 MG tablet Take 1 tablet (10 mg total) by mouth daily. (Patient not taking: Reported on 01/23/2019) 30 tablet 11  . fluticasone (FLONASE) 50 MCG/ACT nasal spray Place 2 sprays into both nostrils daily. (Patient not taking: Reported on 09/18/2018) 16 g 6  . levocetirizine (XYZAL) 5 MG tablet Take 1 tablet (5 mg total) by mouth every evening. (Patient not taking: Reported on 09/18/2018) 30 tablet 11  . nystatin cream (MYCOSTATIN) Apply 1 application topically 2 (two) times daily. (Patient not taking: Reported on 09/18/2018) 30 g 0   Facility-Administered Medications Prior to Visit  Medication Dose Route Frequency Provider Last Rate Last Dose  . lidocaine (XYLOCAINE) 0.5 % (with pres) injection 5 mL  5 mL Intradermal Once Dorena Dew, FNP        No Known Allergies  ROS Review of Systems  Constitutional: Negative.   HENT: Negative.   Eyes: Negative.   Respiratory: Negative.   Cardiovascular: Negative.   Gastrointestinal: Negative.   Endocrine: Negative.   Genitourinary: Positive for vaginal pain (r/t Bartholin's Cyst).  Musculoskeletal: Negative.   Skin: Negative.   Allergic/Immunologic: Negative.   Neurological: Negative.   Hematological: Negative.   Psychiatric/Behavioral: Negative.       Objective:    Physical Exam  Constitutional: She is oriented to person, place, and time. She appears well-developed and well-nourished.  HENT:  Head: Normocephalic and atraumatic.  Eyes: Conjunctivae are normal.  Neck: Normal range of motion.  Neck supple.  Cardiovascular: Normal rate, regular rhythm, normal heart sounds and intact distal pulses.  Pulmonary/Chest: Effort normal and breath sounds normal.  Abdominal: Soft. Bowel sounds are normal.  Genitourinary:     Musculoskeletal: Normal range of motion.  Neurological: She is alert and oriented to person, place, and time. She has normal reflexes.  Skin: Skin is warm and  dry.  Psychiatric: She has a normal mood and affect. Her behavior is normal. Thought content normal.  Nursing note and vitals reviewed.   BP 107/60   Pulse 66   Temp (!) 97.3 F (36.3 C)   Ht 5\' 1"  (1.549 m)   Wt 123 lb 3.2 oz (55.9 kg)   LMP 09/20/2013   SpO2 98%   BMI 23.28 kg/m  Wt Readings from Last 3 Encounters:  06/08/19 123 lb 3.2 oz (55.9 kg)  01/23/19 122 lb (55.3 kg)  09/18/18 127 lb (57.6 kg)   There are no preventive Molina reminders to display for this patient.  There are no preventive Molina reminders to display for this patient.  Lab Results  Component Value Date   TSH 0.436 (L) 06/08/2019   Lab Results  Component Value Date   WBC 15.1 (H) 06/08/2019   HGB 15.2 06/08/2019   HCT 43.1 06/08/2019   MCV 92 06/08/2019   PLT 378 06/08/2019   Lab Results  Component Value Date   NA WILL FOLLOW 06/08/2019   K WILL FOLLOW 06/08/2019   CO2 WILL FOLLOW 06/08/2019   GLUCOSE WILL FOLLOW 06/08/2019   BUN WILL FOLLOW 06/08/2019   CREATININE WILL FOLLOW 06/08/2019   BILITOT WILL FOLLOW 06/08/2019   ALKPHOS WILL FOLLOW 06/08/2019   AST WILL FOLLOW 06/08/2019   ALT WILL FOLLOW 06/08/2019   PROT WILL FOLLOW 06/08/2019   ALBUMIN WILL FOLLOW 06/08/2019   CALCIUM WILL FOLLOW 06/08/2019   ANIONGAP 10 08/27/2015   Lab Results  Component Value Date   CHOL 157 06/08/2019   Lab Results  Component Value Date   HDL 51 06/08/2019   Lab Results  Component Value Date   LDLCALC 92 06/08/2019   Lab Results  Component Value Date   TRIG 72 06/08/2019   Lab Results  Component Value Date    CHOLHDL 3.1 06/08/2019   Lab Results  Component Value Date   HGBA1C 5.4 11/29/2017   Assessment & Plan:   1. Abscess of Bartholin's gland We will initiate antibiotic today. We scheduled her for general surgery at [facility] on 06/10/2019 @ 15:55.   2. Vitamin D deficiency - Cholecalciferol (VITAMIN D) 50 MCG (2000 UT) CAPS; Take 1 capsule (2,000 Units total) by mouth daily.  Dispense: 30 capsule; Refill: 6  3. Chronic back pain, unspecified back location, unspecified back pain laterality - naproxen (NAPROSYN) 375 MG tablet; Take 1 tablet (375 mg total) by mouth 2 (two) times daily.  Dispense: 30 tablet; Refill: 6  4. Generalized anxiety disorder - sertraline (ZOLOFT) 50 MG tablet; Take 1 tablet (50 mg total) by mouth daily.  Dispense: 30 tablet; Refill: 6  5. Aortoiliac occlusive disease (HCC) - atorvastatin (LIPITOR) 20 MG tablet; Take 1 tablet (20 mg total) by mouth daily.  Dispense: 30 tablet; Refill: 6  6. Allergic rhinitis due to pollen, unspecified seasonality - cetirizine (ZYRTEC) 10 MG tablet; Take 1 tablet (10 mg total) by mouth daily.  Dispense: 30 tablet; Refill: 11  7. Disorder of right eustachian tube - fluticasone (FLONASE) 50 MCG/ACT nasal spray; Place 2 sprays into both nostrils daily.  Dispense: 16 g; Refill: 6 - nystatin cream (MYCOSTATIN); Apply 1 application topically 2 (two) times daily.  Dispense: 30 g; Refill: 3  8. Bartholin's gland abscess We will initiate Bactrim today.  - sulfamethoxazole-trimethoprim (BACTRIM DS) 800-160 MG tablet; Take 1 tablet by mouth 2 (two) times daily.  Dispense: 20 tablet; Refill: 0  9. Right ear  pain  10. Grieving Moderate. Recently loss her husband of 32 years. We will continue to monitor and refer her to [place] as needed.   11. Healthcare maintenance - CBC with Differential - Comprehensive metabolic panel; Future - Lipid Panel - TSH - Vitamin D, 25-hydroxy - Vitamin B12 -  Magnesium - Comprehensive metabolic panel  12. Follow up She will follow up in 10 days.   Meds ordered this encounter  Medications  . Cholecalciferol (VITAMIN D) 50 MCG (2000 UT) CAPS    Sig: Take 1 capsule (2,000 Units total) by mouth daily.    Dispense:  30 capsule    Refill:  6  . naproxen (NAPROSYN) 375 MG tablet    Sig: Take 1 tablet (375 mg total) by mouth 2 (two) times daily.    Dispense:  30 tablet    Refill:  6  . sertraline (ZOLOFT) 50 MG tablet    Sig: Take 1 tablet (50 mg total) by mouth daily.    Dispense:  30 tablet    Refill:  6    Patient must call and make an appointment.  Marland Kitchen atorvastatin (LIPITOR) 20 MG tablet    Sig: Take 1 tablet (20 mg total) by mouth daily.    Dispense:  30 tablet    Refill:  6  . cetirizine (ZYRTEC) 10 MG tablet    Sig: Take 1 tablet (10 mg total) by mouth daily.    Dispense:  30 tablet    Refill:  11  . fluticasone (FLONASE) 50 MCG/ACT nasal spray    Sig: Place 2 sprays into both nostrils daily.    Dispense:  16 g    Refill:  6  . nystatin cream (MYCOSTATIN)    Sig: Apply 1 application topically 2 (two) times daily.    Dispense:  30 g    Refill:  3  . sulfamethoxazole-trimethoprim (BACTRIM DS) 800-160 MG tablet    Sig: Take 1 tablet by mouth 2 (two) times daily.    Dispense:  20 tablet    Refill:  0   Orders Placed This Encounter  Procedures  . CBC with Differential  . Comprehensive metabolic panel  . Lipid Panel  . TSH  . Vitamin D, 25-hydroxy  . Vitamin B12  . Magnesium  . Specimen status report   Referral Orders  No referral(s) requested today    Kathe Becton,  MSN, FNP-BC [address] [place], [place] [address] [phone] fax   Problem List Items Addressed This Visit      Cardiovascular and Mediastinum   Aortoiliac occlusive disease ([place])   Relevant Medications   atorvastatin (LIPITOR) 20 MG tablet      Genitourinary   Abscess of Bartholin's gland - Primary   Relevant Medications   nystatin cream (MYCOSTATIN)   sulfamethoxazole-trimethoprim (BACTRIM DS) 800-160 MG tablet     Other   Chronic back pain   Relevant Medications   naproxen (NAPROSYN) 375 MG tablet   sertraline (ZOLOFT) 50 MG tablet   Generalized anxiety disorder   Relevant Medications   sertraline (ZOLOFT) 50 MG tablet   Vitamin D deficiency   Relevant Medications   Cholecalciferol (VITAMIN D) 50 MCG (2000 UT) CAPS    Other Visit Diagnoses    Allergic rhinitis due to pollen, unspecified seasonality       Relevant Medications   cetirizine (ZYRTEC) 10 MG tablet   Disorder of right eustachian  tube       Relevant Medications   fluticasone (FLONASE) 50 MCG/ACT nasal spray   Well woman exam with routine gynecological exam       Relevant Medications   nystatin cream (MYCOSTATIN)   Bartholin's gland abscess       Relevant Medications   nystatin cream (MYCOSTATIN)   sulfamethoxazole-trimethoprim (BACTRIM DS) 800-160 MG tablet   Right ear pain       Grieving       Healthcare maintenance       Relevant Orders   CBC with Differential (Completed)   Comprehensive metabolic panel (Completed)   Lipid Panel (Completed)   TSH (Completed)   Vitamin D, 25-hydroxy (Completed)   Vitamin B12 (Completed)   Magnesium (Completed)   Follow up          Meds ordered this encounter  Medications  . Cholecalciferol (VITAMIN D) 50 MCG (2000 UT) CAPS    Sig: Take 1 capsule (2,000 Units total) by mouth daily.    Dispense:  30 capsule    Refill:  6  . naproxen (NAPROSYN) 375 MG tablet    Sig: Take 1 tablet (375 mg total) by mouth 2 (two) times daily.    Dispense:  30 tablet    Refill:  6  . sertraline (ZOLOFT) 50 MG tablet    Sig: Take 1 tablet (50 mg total) by mouth daily.    Dispense:  30 tablet    Refill:  6    Patient must call and make an appointment.  Marland Kitchen atorvastatin (LIPITOR) 20 MG tablet    Sig: Take 1 tablet (20 mg  total) by mouth daily.    Dispense:  30 tablet    Refill:  6  . cetirizine (ZYRTEC) 10 MG tablet    Sig: Take 1 tablet (10 mg total) by mouth daily.    Dispense:  30 tablet    Refill:  11  . fluticasone (FLONASE) 50 MCG/ACT nasal spray    Sig: Place 2 sprays into both nostrils daily.    Dispense:  16 g    Refill:  6  . nystatin cream (MYCOSTATIN)    Sig: Apply 1 application topically 2 (two) times daily.    Dispense:  30 g    Refill:  3  . sulfamethoxazole-trimethoprim (BACTRIM DS) 800-160 MG tablet    Sig: Take 1 tablet by mouth 2 (two) times daily.    Dispense:  20 tablet    Refill:  0    Follow-up: Return in about 10 days (around 06/18/2019).    Azzie Glatter, FNP

## 2019-06-09 LAB — CBC WITH DIFFERENTIAL/PLATELET
Basophils Absolute: 0.1 10*3/uL (ref 0.0–0.2)
Basos: 1 %
EOS (ABSOLUTE): 0.3 10*3/uL (ref 0.0–0.4)
Eos: 2 %
Hematocrit: 43.1 % (ref 34.0–46.6)
Hemoglobin: 15.2 g/dL (ref 11.1–15.9)
Immature Grans (Abs): 0 10*3/uL (ref 0.0–0.1)
Immature Granulocytes: 0 %
Lymphocytes Absolute: 2 10*3/uL (ref 0.7–3.1)
Lymphs: 13 %
MCH: 32.3 pg (ref 26.6–33.0)
MCHC: 35.3 g/dL (ref 31.5–35.7)
MCV: 92 fL (ref 79–97)
Monocytes Absolute: 1.1 10*3/uL — ABNORMAL HIGH (ref 0.1–0.9)
Monocytes: 7 %
Neutrophils Absolute: 11.6 10*3/uL — ABNORMAL HIGH (ref 1.4–7.0)
Neutrophils: 77 %
Platelets: 378 10*3/uL (ref 150–450)
RBC: 4.7 x10E6/uL (ref 3.77–5.28)
RDW: 11.6 % — ABNORMAL LOW (ref 11.7–15.4)
WBC: 15.1 10*3/uL — ABNORMAL HIGH (ref 3.4–10.8)

## 2019-06-09 LAB — LIPID PANEL
Chol/HDL Ratio: 3.1 ratio (ref 0.0–4.4)
Cholesterol, Total: 157 mg/dL (ref 100–199)
HDL: 51 mg/dL (ref >39)
LDL Chol Calc (NIH): 92 mg/dL (ref 0–99)
Triglycerides: 72 mg/dL (ref 0–149)
VLDL Cholesterol Cal: 14 mg/dL (ref 5–40)

## 2019-06-09 LAB — TSH: TSH: 0.436 u[IU]/mL — ABNORMAL LOW (ref 0.450–4.500)

## 2019-06-09 LAB — VITAMIN B12: Vitamin B-12: 741 pg/mL (ref 232–1245)

## 2019-06-09 LAB — VITAMIN D 25 HYDROXY (VIT D DEFICIENCY, FRACTURES): Vit D, 25-Hydroxy: 52.7 ng/mL (ref 30.0–100.0)

## 2019-06-09 LAB — MAGNESIUM: Magnesium: 2.1 mg/dL (ref 1.6–2.3)

## 2019-06-10 ENCOUNTER — Encounter: Payer: Self-pay | Admitting: Obstetrics & Gynecology

## 2019-06-10 ENCOUNTER — Ambulatory Visit (INDEPENDENT_AMBULATORY_CARE_PROVIDER_SITE_OTHER): Payer: Self-pay | Admitting: Obstetrics & Gynecology

## 2019-06-10 ENCOUNTER — Other Ambulatory Visit: Payer: Self-pay

## 2019-06-10 VITALS — BP 117/72 | HR 79 | Wt 123.0 lb

## 2019-06-10 DIAGNOSIS — N750 Cyst of Bartholin's gland: Secondary | ICD-10-CM

## 2019-06-10 MED ORDER — LIDOCAINE VISCOUS HCL 2 % MT SOLN: OROMUCOSAL | 1 refills | Status: DC

## 2019-06-10 NOTE — Patient Instructions (Signed)
Bartholin's Cyst  A Bartholin's cyst is a fluid-filled sac that forms on a Bartholin's gland. Bartholin's glands are small glands in the folds of skin around the vaginal opening (labia). These glands produce a fluid to moisten (lubricate) the outside of the vagina during sex. A cyst that is not large or infected may not cause any problems or require treatment. If the cyst gets infected, it is called a Bartholin's abscess. An abscess may cause symptoms such as pain and swelling and is more likely to require treatment. What are the causes? This condition may be caused by a blocked Bartholin's gland. These glands can become blocked due to natural buildup of fluid and oils. Bacteria inside of the cyst can cause infection. In many cases, the cause is not known. What increases the risk? You may be at increased risk of developing a Bartholin's cyst or abscess if:  You are of childbearing age.  You have a history of Bartholin's cysts or abscesses.  You have diabetes.  You have an STI (sexually transmitted infection). What are the signs or symptoms? Symptoms may include:  A bulge or lump on the labia, near the lower opening of the vagina.  Discomfort or pain. This may get worse during sex or when walking.  Redness, swelling, or fluid draining from the area. These may be signs of an abscess. How severe your symptoms are depends on the size of your cyst and whether it is infected. Infection causes symptoms to get more severe. How is this diagnosed? This condition may be diagnosed based on:  Your symptoms and medical history.  A physical exam to check for swelling in your vaginal area. You may lie on your back on an exam table and have your feet placed into footrests for the exam.  Blood tests to check for infections.  Removal of a fluid sample from the cyst or abscess (biopsy) for testing. You may work with a health care provider who specializes in [employer] health (gynecologist) for diagnosis  and treatment. How is this treated? If your cyst is small, not infected, and not causing symptoms, you may not need any treatment. These cysts often go away on their own, with home care such as hot baths or warm compresses. If you have a large cyst or an abscess, treatment may include:  Antibiotic medicine.  A procedure to drain the fluid inside the cyst or abscess. These procedures involve making an incision in the cyst or abscess so that the fluid drains out, and then one of the following may be done: ? A small, thin tube (catheter) may be placed inside the cyst or abscess so that it does not close and fill up with fluid again (fistulization). The catheter will be removed at a follow-up visit. ? The edges of the incision may be stitched to your skin so that the cyst or abscess stays open (marsupialization). This allows it to continue to drain and not fill up with fluid again. If you have cysts or abscesses that keep returning (recurring) and have required incision and drainage multiple times, your health care provider may talk with you about surgery to remove the Bartholin's gland. Follow these instructions at home: Medicines  Take over-the-counter and prescription medicines only as told by your health care provider.  If you were prescribed an antibiotic medicine, take it as told by your health care provider. Do not stop taking the antibiotic even if your condition improves. Managing pain and swelling  Try sitz baths to help with  pain and swelling. A sitz bath is a warm water bath in which the water only comes up to your hips and should cover your buttocks. You may take sitz baths several times a day.  Apply heat to the affected area as often as needed. Use the heat source that your health care provider recommends, such as a moist heat pack or a heating pad. ? Place a towel between your skin and the heat source. ? Leave the heat on for 20-30 minutes. ? Remove the heat if your skin turns  bright red. This is especially important if you are unable to feel pain, heat, or cold. You may have a greater risk of getting burned. General instructions  If your cyst or abscess was drained, follow instructions from your health care provider about how to take care of your wound. Use feminine pads as needed to absorb any drainage.  Do not push on or squeeze your cyst.  Do not have sex until the cyst has gone away or your wound from drainage has healed.  Take these steps to help prevent a Bartholin's cyst from returning, and to prevent other Bartholin's cysts from developing: ? Take a bath or shower once a day. Clean your vaginal area with mild soap and water when you bathe. ? Practice safe sex to prevent STIs. Talk with your health care provider about how to prevent STIs and which forms of birth control (contraception) may be best for you.  Keep all follow-up visits as told by your health care provider. This is important. Contact a health care provider if:  You have a fever.  You develop redness, swelling, or pain around your cyst.  You have fluid, blood, pus, or a bad smell coming from your cyst.  You have a cyst that gets larger or comes back. Summary  A Bartholin's cyst is a fluid-filled sac that forms on a Bartholin's gland. These glands are in the folds of skin around the vaginal opening (labia).  If your cyst is small, not infected, and not causing symptoms, you may not need any treatment. These cysts often go away on their own, with home care such as hot baths or warm compresses.  If you have a large cyst or an abscess, your health care provider may perform a procedure to drain the fluid.  If you have cysts or abscesses that keep returning (recurring) and have required incision and drainage multiple times, your health care provider may talk with you about surgery to remove the Bartholin's gland. This information is not intended to replace advice given to you by your health  care provider. Make sure you discuss any questions you have with your health care provider. Document Released: 07/23/2005 Document Revised: 05/15/2018 Document Reviewed: 04/24/2017 Elsevier Patient Education  2020 [employer]  

## 2019-06-10 NOTE — Progress Notes (Signed)
GYNECOLOGY OFFICE VISIT NOTE  History:   Brandi Molina is a 53 y.o. G2P1011 here today for management of recurrent left Bartholin's gland cyst. According to patient, it was  "as large as a goose's egg" but it ruptured and drained a lot  x 1 day, but there is still some fluid left.  Reports pain and discomfort, but decreased from before drainage. No further drainage currently. She was placed on ten day course of Bactrim DS by her PCP, still taking the medication.   She denies any abnormal vaginal bleeding, pelvic pain or other concerns.    Past Medical History:  Diagnosis Date  . Allergy   . Anxiety   . Bartholin gland cyst 2008  . Depression   . Hyperlipidemia     Past Surgical History:  Procedure Laterality Date  . CHOLECYSTECTOMY  1999    The following portions of the patient's history were reviewed and updated as appropriate: allergies, current medications, past family history, past medical history, past social history, past surgical history and problem list.   Health Maintenance:  Normal pap and negative HRHPV on 06/30/2018.  Normal mammogram on 09/30/2018.   Review of Systems:  Pertinent items noted in HPI and remainder of comprehensive ROS otherwise negative.  Physical Exam:  BP 117/72   Pulse 79   Wt 123 lb (55.8 kg)   LMP 09/20/2013 (LMP Unknown)   BMI 23.24 kg/m  CONSTITUTIONAL: Well-developed, well-nourished female in no acute distress.  HEENT:  Normocephalic, atraumatic. External right and left ear normal. No scleral icterus.  NECK: Normal range of motion, supple, no masses noted on observation SKIN: No rash noted. Not diaphoretic. No erythema. No pallor. MUSCULOSKELETAL: Normal range of motion. No edema noted. NEUROLOGIC: Alert and oriented to person, place, and time. Normal muscle tone coordination. No cranial nerve deficit noted. PSYCHIATRIC: Normal mood and affect. Normal behavior. Normal judgment and thought content. CARDIOVASCULAR: Normal heart rate  noted RESPIRATORY: Effort and breath sounds normal, no problems with respiration noted ABDOMEN: No masses noted. No other overt distention noted.   PELVIC: Normal appearing external genitalia except for 1.5 cm left Bartholin's gland cyst palpated.  Previous drainage site noted, unable to express more fluid on palpation. Moderately tender to touch, no erythema. Otherwise, normal appearing distal vaginal mucosa.  Bartholin Cyst I&D and Word Catheter Placement Enlarged abscess palpated in front of the hymenal ring around 5 o' clock.  Written informed consent was obtained.  Discussed complications and possible outcomes of procedure including recurrence of cyst, scarring leading to infection, bleeding, dyspareunia, distortion of anatomy.  Patient was examined in the dorsal lithotomy position and mass was identified.  The area was prepped with Iodine and draped in a sterile manner. 1% Lidocaine (3 ml) was then used to infiltrate area on top of the cyst, behind the hymenal ring.  A 5 mm incision was made using a sterile scapel. Upon palpation of the mass, a small amount of bloody purulent drainage was expressed through the incision. A hemostat was used to break up loculations, which resulted in expression of more bloody purulent drainage. The open cyst was then copiously irrigated with normal saline, and a Word catheter was placed. 1.5 ml of sterile water was used to inflate the catheter balloon.  The end of the catheter was tucked into the vagina.  Patient tolerated the procedure well, reported feeling  better.      Assessment and Plan:    1. Bartholin's gland cyst - Continue Bactrim DS bid  x 10 day course - Recommended OTC pain meds; also Lidocaine prescribed for pain.   ** lidocaine (XYLOCAINE) 2 % solution; Apply to affected area as Needed for pain  Dispense: 30 mL; Refill: 1  - She was told to call to be examined if she experiences increasing swelling, pain, vaginal discharge, or fever.   - She was  instructed to wear a peripad to absorb discharge, and to maintain pelvic rest while the Word catheter is in place.  -The catheter will be left in place for at least two to four weeks to promote formation of an epithelialized tract for permanent drainage of glandular secretions.   Please refer to After Visit Summary for other counseling recommendations.   Return in about 1 week (around 06/17/2019) for Followup Bartholin's gland cyst.    Total face-to-face time with patient: 25 minutes.  Over 50% of encounter was spent on counseling and coordination of care.   Verita Schneiders, MD, [place] for [facility], [place]

## 2019-06-11 ENCOUNTER — Encounter: Payer: Self-pay | Admitting: Obstetrics & Gynecology

## 2019-06-12 LAB — COMPREHENSIVE METABOLIC PANEL
ALT: 17 IU/L (ref 0–32)
AST: 25 IU/L (ref 0–40)
Albumin/Globulin Ratio: 1.6 (ref 1.2–2.2)
Albumin: 4.4 g/dL (ref 3.8–4.9)
Alkaline Phosphatase: 117 IU/L (ref 39–117)
BUN/Creatinine Ratio: 15 (ref 9–23)
BUN: 9 mg/dL (ref 6–24)
Bilirubin Total: 0.5 mg/dL (ref 0.0–1.2)
CO2: 17 mmol/L — ABNORMAL LOW (ref 20–29)
Calcium: 9.6 mg/dL (ref 8.7–10.2)
Creatinine, Ser: 0.62 mg/dL (ref 0.57–1.00)
GFR calc Af Amer: 120 mL/min/{1.73_m2} (ref >59)
GFR calc non Af Amer: 104 mL/min/{1.73_m2} (ref >59)
Globulin, Total: 2.8 g/dL (ref 1.5–4.5)
Glucose: 96 mg/dL (ref 65–99)
Total Protein: 7.2 g/dL (ref 6.0–8.5)

## 2019-06-12 LAB — SPECIMEN STATUS REPORT

## 2019-06-17 ENCOUNTER — Telehealth: Payer: Self-pay | Admitting: *Deleted

## 2019-06-17 ENCOUNTER — Other Ambulatory Visit: Payer: Self-pay

## 2019-06-17 ENCOUNTER — Ambulatory Visit (INDEPENDENT_AMBULATORY_CARE_PROVIDER_SITE_OTHER): Payer: Self-pay | Admitting: Obstetrics & Gynecology

## 2019-06-17 ENCOUNTER — Encounter: Payer: Self-pay | Admitting: Obstetrics & Gynecology

## 2019-06-17 DIAGNOSIS — N750 Cyst of Bartholin's gland: Secondary | ICD-10-CM

## 2019-06-17 MED FILL — LIDOCAINE 2% VISCOUS SOLN: 2 | 30 days supply | Qty: 100 | Fill #0

## 2019-06-17 NOTE — Progress Notes (Signed)
   GYNECOLOGY OFFICE VISIT NOTE  History:   Brandi Molina is a 53 y.o. G2P1011 here today for follow up after I&D and placement of Word catheter for left Bartholin's cyst last week.  Catheter is still in place, still having some drainage. She denies any abnormal vaginal bleeding, pelvic pain, feveror other concerns.    Past Medical History:  Diagnosis Date  . Allergy   . Anxiety   . Bartholin gland cyst 2008  . Depression   . Hyperlipidemia     Past Surgical History:  Procedure Laterality Date  . CHOLECYSTECTOMY  1999    The following portions of the patient's history were reviewed and updated as appropriate: allergies, current medications, past family history, past medical history, past social history, past surgical history and problem list.   Health Maintenance:  Normal pap and negative HRHPV on 06/30/2018.  Normal mammogram on 09/30/2018.   Review of Systems:  Pertinent items noted in HPI and remainder of comprehensive ROS otherwise negative.  Physical Exam:  BP 120/63   Pulse 87   Wt 122 lb 4.8 oz (55.5 kg)   LMP 09/20/2013 (LMP Unknown)   BMI 23.11 kg/m  CONSTITUTIONAL: Well-developed, well-nourished female in no acute distress.  NEUROLOGIC: Alert and oriented to person, place, and time. Normal muscle tone coordination. No cranial nerve deficit noted. PSYCHIATRIC: Normal mood and affect. Normal behavior. Normal judgment and thought content. CARDIOVASCULAR: Normal heart rate noted RESPIRATORY: Effort and breath sounds normal, no problems with respiration noted ABDOMEN: No masses noted. No other overt distention noted.   PELVIC: Normal appearing external genitalia; normal appearing vaginal mucosa and cervix.  Word catheter still in place in opened left Bartholin's gland. No further collection palpated.     Assessment and Plan:    1. Bartholin gland cyst Doing well so far. No concerns. - She is to complete Bactrim course as prescribed. - She was told to call to be  examined if she experiences increasing swelling, pain, vaginal discharge, or fever.  - She was instructed to wear a peripad to absorb discharge, and to maintain pelvic rest while the Word catheter is in place.  -The catheter will be left in place for at least four weeks to promote formation of an epithelialized tract for permanent drainage of glandular secretions.  Routine preventative health maintenance measures emphasized. Please refer to After Visit Summary for other counseling recommendations.   Return in about 4 weeks (around 07/15/2019) for Bartholin's gland cyst followup , possible catheter removal.    Total face-to-face time with patient: 15 minutes.  Over 50% of encounter was spent on counseling and coordination of care.   Verita Schneiders, MD, [place] for [facility], [place]

## 2019-06-17 NOTE — Patient Instructions (Signed)
Return to clinic for any scheduled appointments or for any gynecologic concerns as needed.   

## 2019-06-17 NOTE — Telephone Encounter (Signed)
[facility] called regarding Xylocaine prescription sent by Dr Vernice Jefferson. Needed clarification as to if script was for viscous solution and if so, will need to write for 119ml. Talked with Dr Eddie North who said viscous Lidocaine is correct and to change to dispense 100cc. Pharmacy aware and will dispense as such.

## 2019-07-06 MED FILL — ?ATORVASTATIN 20 MG TABLET: 20 | 30 days supply | Qty: 30 | Fill #1

## 2019-07-06 MED FILL — SERTRALINE HCL 50 MG TABS: 50 | 30 days supply | Qty: 30 | Fill #1

## 2019-07-06 MED FILL — ?CETIRIZINE HCL 10 MG TABLE: 10 | 30 days supply | Qty: 30 | Fill #1

## 2019-07-07 ENCOUNTER — Encounter: Payer: Self-pay | Admitting: *Deleted

## 2019-07-15 ENCOUNTER — Ambulatory Visit (INDEPENDENT_AMBULATORY_CARE_PROVIDER_SITE_OTHER): Payer: Self-pay | Admitting: Obstetrics & Gynecology

## 2019-07-15 ENCOUNTER — Other Ambulatory Visit: Payer: Self-pay

## 2019-07-15 ENCOUNTER — Encounter: Payer: Self-pay | Admitting: Obstetrics & Gynecology

## 2019-07-15 VITALS — BP 122/81 | HR 81 | Wt 125.1 lb

## 2019-07-15 DIAGNOSIS — N750 Cyst of Bartholin's gland: Secondary | ICD-10-CM

## 2019-07-15 NOTE — Progress Notes (Signed)
   GYNECOLOGY OFFICE VISIT NOTE  History:   Brandi Molina is a 53 y.o. G2P1011 here today for follow up of her left Bartholin's gland cyst and removal of Word catheter. No concerns, catheter still in place.  She denies any abnormal vaginal discharge, bleeding, pelvic pain or other concerns.    Past Medical History:  Diagnosis Date  . Allergy   . Anxiety   . Bartholin gland cyst 2008  . Depression   . Hyperlipidemia     Past Surgical History:  Procedure Laterality Date  . CHOLECYSTECTOMY  1999    The following portions of the patient's history were reviewed and updated as appropriate: allergies, current medications, past family history, past medical history, past social history, past surgical history and problem list.   Health Maintenance:  Normal pap and negative HRHPV on 06/30/2018.  Normal mammogram on 09/30/2018.   Review of Systems:  Pertinent items noted in HPI and remainder of comprehensive ROS otherwise negative.  Physical Exam:  BP 122/81   Pulse 81   Wt 125 lb 1.6 oz (56.7 kg)   LMP 09/20/2013 (LMP Unknown)   BMI 23.64 kg/m  CONSTITUTIONAL: Well-developed, well-nourished female in no acute distress. Marland Kitchen PSYCHIATRIC: Normal mood and affect. Normal behavior. Normal judgment and thought content. CARDIOVASCULAR: Normal heart rate noted RESPIRATORY: Effort and breath sounds normal, no problems with respiration noted ABDOMEN: No masses noted. No other overt distention noted.   PELVIC: Normal appearing external genitalia; Word catheter in place.  Catheter deflated and removed without complication. Well healed epithelialized tract noted, this will help in drainage of glandular secretions  No abnormal discharge noted.      Assessment and Plan:    1. Bartholin gland cyst She is s/p Word catheter removal, doing well. Advised to call for any recurrence or concerns.    Routine preventative health maintenance measures emphasized. Please refer to After Visit Summary for  other counseling recommendations.   No follow-ups on file.    Total face-to-face time with patient: 10 minutes.  Over 50% of encounter was spent on counseling and coordination of care.   Verita Schneiders, MD, [place] for [facility], [place]

## 2019-08-10 MED FILL — ?CETIRIZINE HCL 10 MG TABLE: 10 | 30 days supply | Qty: 30 | Fill #2

## 2019-08-10 MED FILL — SERTRALINE HCL 50 MG TABS: 50 | 30 days supply | Qty: 30 | Fill #2

## 2019-08-10 MED FILL — NAPROXEN 375 MG TABLET: 375 | 15 days supply | Qty: 30 | Fill #1

## 2019-08-10 MED FILL — ?ATORVASTATIN 20 MG TABLET: 20 | 30 days supply | Qty: 30 | Fill #2

## 2019-08-24 ENCOUNTER — Ambulatory Visit: Payer: No Typology Code available for payment source | Attending: Internal Medicine

## 2019-08-24 DIAGNOSIS — Z20822 Contact with and (suspected) exposure to covid-19: Secondary | ICD-10-CM | POA: Insufficient documentation

## 2019-08-25 LAB — NOVEL CORONAVIRUS, NAA: SARS-CoV-2, NAA: NOT DETECTED

## 2019-09-07 MED FILL — SERTRALINE HCL 50 MG TABS: 50 | 30 days supply | Qty: 30 | Fill #3

## 2019-09-07 MED FILL — ?CETIRIZINE HCL 10 MG TABLE: 10 | 30 days supply | Qty: 30 | Fill #3

## 2019-09-07 MED FILL — NAPROXEN 375 MG TABLET: 375 | 15 days supply | Qty: 30 | Fill #2

## 2019-09-07 MED FILL — ?ATORVASTATIN 20 MG TABLET: 20 | 30 days supply | Qty: 30 | Fill #3

## 2019-09-11 ENCOUNTER — Other Ambulatory Visit (HOSPITAL_COMMUNITY): Payer: Self-pay

## 2019-09-11 DIAGNOSIS — Z1231 Encounter for screening mammogram for malignant neoplasm of breast: Secondary | ICD-10-CM

## 2019-10-16 MED FILL — ?CETIRIZINE HCL 10 MG TABLE: 10 | 30 days supply | Qty: 30 | Fill #4

## 2019-10-16 MED FILL — ?ATORVASTATIN 20 MG TABLET: 20 | 30 days supply | Qty: 30 | Fill #4

## 2019-10-16 MED FILL — SERTRALINE HCL 50 MG TABLET: 50 | 30 days supply | Qty: 30 | Fill #4

## 2019-10-20 ENCOUNTER — Ambulatory Visit
Admission: RE | Admit: 2019-10-20 | Discharge: 2019-10-20 | Disposition: A | Discharge status: Home / Self Care | Payer: No Typology Code available for payment source | Source: Ambulatory Visit | Attending: Obstetrics and Gynecology | Admitting: Obstetrics and Gynecology

## 2019-10-20 ENCOUNTER — Encounter: Payer: Self-pay | Admitting: Advanced Practice Midwife

## 2019-10-20 ENCOUNTER — Other Ambulatory Visit: Payer: Self-pay

## 2019-10-20 ENCOUNTER — Ambulatory Visit: Payer: Self-pay | Admitting: Advanced Practice Midwife

## 2019-10-20 VITALS — BP 114/83 | Temp 97.1°F | Wt 119.0 lb

## 2019-10-20 DIAGNOSIS — Z1231 Encounter for screening mammogram for malignant neoplasm of breast: Secondary | ICD-10-CM

## 2019-10-20 DIAGNOSIS — Z1239 Encounter for other screening for malignant neoplasm of breast: Secondary | ICD-10-CM

## 2019-10-20 NOTE — Progress Notes (Signed)
Ms. KHUSBU USSERY is a 54 y.o. female who presents to [facility] clinic today with no complaints.    Pap Smear: Pap not smear completed today. Last Pap smear was 06/30/2018 at unknown clinic and was normal. Per patient has no history of an abnormal Pap smear. Last Pap smear result is available in Epic.   Physical exam: Breasts Breasts symmetrical. No skin abnormalities bilateral breasts. No nipple retraction bilateral breasts. No nipple discharge bilateral breasts. No lymphadenopathy. No lumps palpated bilateral breasts.       Pelvic/Bimanual Pap is not indicated today    Smoking History: Patient has is a current smoker at 1 packs per day. Patient referred to quit line.    Patient Navigation: Patient education provided. Access to services provided for patient through [facility] program. No interpreter provided. No transportation provided   Colorectal Cancer Screening: Per patient has had colonoscopy completed on 06/27/2018 No complaints today.    Breast and Cervical Cancer Risk Assessment: Patient does not have family history of breast cancer, known genetic mutations, or radiation treatment to the chest before age 25. Patient does not have history of cervical dysplasia, immunocompromised, or DES exposure in-utero.  Risk Assessment    Risk Scores      10/20/2019 09/18/2018   Last edited by: Demetrius Revel, LPN Rolena Infante H, LPN   5-year risk: 1.1 % 1.1 %   Lifetime risk: 8.6 % 8.8 %          A: [facility] exam without pap smear Complaint of none  P: Referred patient to the [facility] for a screening mammogram. Appointment scheduled 10/20/2019 at 1110.  Marcille Buffy DNP, CNM  10/20/19  10:27 AM

## 2019-10-30 ENCOUNTER — Ambulatory Visit: Payer: Self-pay

## 2019-10-30 ENCOUNTER — Other Ambulatory Visit: Payer: Self-pay

## 2019-11-02 ENCOUNTER — Other Ambulatory Visit: Payer: Self-pay

## 2019-11-02 ENCOUNTER — Ambulatory Visit (INDEPENDENT_AMBULATORY_CARE_PROVIDER_SITE_OTHER): Payer: Self-pay | Admitting: Nurse Practitioner

## 2019-11-02 VITALS — BP 117/58 | HR 71 | Temp 97.6°F | Resp 16 | Ht 61.0 in | Wt 121.0 lb

## 2019-11-02 DIAGNOSIS — R42 Dizziness and giddiness: Secondary | ICD-10-CM

## 2019-11-02 DIAGNOSIS — L570 Actinic keratosis: Secondary | ICD-10-CM

## 2019-11-02 MED ORDER — MECLIZINE HCL 25 MG PO TABS: 25.0000 mg | ORAL_TABLET | Freq: Three times a day (TID) | ORAL | 0 refills | Status: DC | PRN

## 2019-11-02 MED FILL — MECLIZINE 25 MG TABLET: 25 | 10 days supply | Qty: 30 | Fill #0

## 2019-11-02 NOTE — Progress Notes (Signed)
Acute Office Visit  Subjective:    Patient ID: Brandi Molina, female    DOB: 10/14/1965, 54 y.o.   MRN: [id number]  Chief Complaint  Patient presents with  . Ear Problem    states she has chronic ear problems with right ear wants it looked at   . Sore    scab or sore on chest area?    HPI  Patient is in today for an acute visit.  She  has a past medical history of Allergy, Anxiety, Bartholin gland cyst (2008), Depression, and Hyperlipidemia.  Vertigo Patient presents for evaluation of dizziness. The symptoms started a week ago and have gradually improved. The attacks occur intermittently and last 30 minutes. Positions that worsen symptoms: bending over, head back and turning head. Previous workup/treatments: none. Associated ear symptoms: otalgia. Associated CNS symptoms: none. Recent infections: ear infection. Head trauma: denied. Drug ingestion: none. Noise exposure: She was painting however this is something that she does regularly.. Family history: Negative.  Ear Pain Patient complains of ear pain and possible ear infection. Symptoms include right ear pain. Onset of symptoms was 1 week ago, and have been unchanged since that time. Associated symptoms include: Dizziness. Patient denies: chills, congestion, fever , headache, non productive cough, post nasal drip, productive cough, sinus pressure, sneezing and sore throat. She is drinking moderate amounts of fluids.  Rash Patient presents for evaluation of a rash involving the chest. Rash started several month ago. Lesion is skin tone, scaly and raised in texture. Rash has not changed over time. Rash causes no discomfort. Associated symptoms: none. Patient denies: abdominal pain, arthralgia, decrease in energy level, fever, myalgia, nausea and vomiting. Patient has not had contacts with similar rash. Patient has not had new exposures (soaps, lotions, laundry detergents, foods, medications, plants, insects or animals).  Past Medical  History:  Diagnosis Date  . Allergy   . Anxiety   . Bartholin gland cyst 2008  . Depression   . Hyperlipidemia     Past Surgical History:  Procedure Laterality Date  . BREAST BIOPSY Left 2020  . CHOLECYSTECTOMY  1999    Family History  Problem Relation Age of Onset  . Diabetes Mother   . Heart disease Mother   . Heart attack Mother   . AAA (abdominal aortic aneurysm) Mother   . COPD Mother   . Cancer Father        lung  . Hypertension Brother   . Cancer - Other Brother        pallate cancer  . Diabetes Brother   . Anesthesia problems Neg Hx   . Hypotension Neg Hx   . Malignant hyperthermia Neg Hx   . Pseudochol deficiency Neg Hx   . Colon cancer Neg Hx   . Stomach cancer Neg Hx   . Rectal cancer Neg Hx   . Esophageal cancer Neg Hx   . Breast cancer Neg Hx     Social History   Socioeconomic History  . Marital status: Widowed    Spouse name: Not on file  . Number of children: 1  . Years of education: Not on file  . Highest education level: 12th grade  Occupational History  . Not on file  Tobacco Use  . Smoking status: Current Every Day Smoker    Packs/day: 0.50    Years: 20.00    Pack years: 10.00    Types: Cigarettes  . Smokeless tobacco: Never Used  Substance and Sexual Activity  . Alcohol use:  No    Alcohol/week: 0.0 standard drinks  . Drug use: No  . Sexual activity: Yes  Other Topics Concern  . Not on file  Social History Narrative  . Not on file   Social Determinants of Health   Financial Resource Strain:   . Difficulty of Paying Living Expenses:   Food Insecurity:   . Worried About Charity fundraiser in the Last Year:   . Arboriculturist in the Last Year:   Transportation Needs: No Transportation Needs  . Lack of Transportation (Medical): No  . Lack of Transportation (Non-Medical): No  Physical Activity:   . Days of Exercise per Week:   . Minutes of Exercise per Session:   Stress:   . Feeling of Stress :   Social Connections:   .  Frequency of Communication with Friends and Family:   . Frequency of Social Gatherings with Friends and Family:   . Attends Religious Services:   . Active Member of Clubs or Organizations:   . Attends Archivist Meetings:   Marland Kitchen Marital Status:   Intimate Partner Violence:   . Fear of Current or Ex-Partner:   . Emotionally Abused:   Marland Kitchen Physically Abused:   . Sexually Abused:     Outpatient Medications Prior to Visit  Medication Sig Dispense Refill  . acetaminophen (TYLENOL) 500 MG tablet Take 1 tablet (500 mg total) by mouth every 6 (six) hours as needed. 30 tablet 0  . aspirin 81 MG tablet Take 81 mg by mouth daily.    Marland Kitchen atorvastatin (LIPITOR) 20 MG tablet Take 1 tablet (20 mg total) by mouth daily. 30 tablet 6  . cetirizine (ZYRTEC) 10 MG tablet Take 1 tablet (10 mg total) by mouth daily. 30 tablet 11  . Cholecalciferol (VITAMIN D) 50 MCG (2000 UT) CAPS Take 1 capsule (2,000 Units total) by mouth daily. 30 capsule 6  . fluticasone (FLONASE) 50 MCG/ACT nasal spray Place 2 sprays into both nostrils daily. 16 g 6  . Multiple Vitamin (MULTIVITAMIN) tablet Take 1 tablet by mouth daily.    . naproxen (NAPROSYN) 375 MG tablet Take 1 tablet (375 mg total) by mouth 2 (two) times daily. 30 tablet 6  . nystatin cream (MYCOSTATIN) Apply 1 application topically 2 (two) times daily. 30 g 3  . sertraline (ZOLOFT) 50 MG tablet Take 1 tablet (50 mg total) by mouth daily. 30 tablet 6  . lidocaine (XYLOCAINE) 2 % solution Apply to affected area as needed for pain (Patient not taking: Reported on 11/02/2019) 30 mL 1  . Omega-3 Fatty Acids (FISH OIL) 1000 MG CAPS Take by mouth.    . sulfamethoxazole-trimethoprim (BACTRIM DS) 800-160 MG tablet Take 1 tablet by mouth 2 (two) times daily. (Patient not taking: Reported on 07/15/2019) 20 tablet 0   Facility-Administered Medications Prior to Visit  Medication Dose Route Frequency Provider Last Rate Last Admin  . lidocaine (XYLOCAINE) 0.5 % (with pres)  injection 5 mL  5 mL Intradermal Once Dorena Dew, FNP        No Known Allergies  Review of Systems  All other systems reviewed and are negative.      Objective:    Physical Exam Constitutional:      Appearance: Normal appearance.  HENT:     Head: Normocephalic.     Right Ear: Tympanic membrane normal.     Left Ear: Tympanic membrane normal.     Nose: Nose normal.     Mouth/Throat:  Mouth: Mucous membranes are moist.     Pharynx: Oropharynx is clear.  Eyes:     Pupils: Pupils are equal, round, and reactive to light.  Cardiovascular:     Rate and Rhythm: Normal rate and regular rhythm.     Heart sounds: Normal heart sounds.  Pulmonary:     Effort: Pulmonary effort is normal.     Breath sounds: Normal breath sounds.  Musculoskeletal:        General: Normal range of motion.     Cervical back: Normal range of motion.  Skin:    Comments: Left anterior chest irregular shaped nontender scaly lesion no signs of erythema swelling  Neurological:     General: No focal deficit present.     Mental Status: She is alert and oriented to person, place, and time.  Psychiatric:        Mood and Affect: Mood normal.        Behavior: Behavior normal.        Thought Content: Thought content normal.        Judgment: Judgment normal.     BP (!) 117/58 (BP Location: Right Arm, Patient Position: Sitting, Cuff Size: Normal)   Pulse 71   Temp 97.6 F (36.4 C) (Oral)   Resp 16   Ht 5\' 1"  (1.549 m)   Wt 121 lb (54.9 kg)   LMP 09/20/2013 (LMP Unknown)   SpO2 100%   BMI 22.86 kg/m  Wt Readings from Last 3 Encounters:  11/02/19 121 lb (54.9 kg)  10/20/19 119 lb (54 kg)  07/15/19 125 lb 1.6 oz (56.7 kg)    There are no preventive care reminders to display for this patient.  There are no preventive care reminders to display for this patient.   Lab Results  Component Value Date   TSH 0.436 (L) 06/08/2019   Lab Results  Component Value Date   WBC 15.1 (H) 06/08/2019    HGB 15.2 06/08/2019   HCT 43.1 06/08/2019   MCV 92 06/08/2019   PLT 378 06/08/2019   Lab Results  Component Value Date   NA CANCELED 06/08/2019   K CANCELED 06/08/2019   CO2 17 (L) 06/08/2019   GLUCOSE 96 06/08/2019   BUN 9 06/08/2019   CREATININE 0.62 06/08/2019   BILITOT 0.5 06/08/2019   ALKPHOS 117 06/08/2019   AST 25 06/08/2019   ALT 17 06/08/2019   PROT 7.2 06/08/2019   ALBUMIN 4.4 06/08/2019   CALCIUM 9.6 06/08/2019   ANIONGAP 10 08/27/2015   Lab Results  Component Value Date   CHOL 157 06/08/2019   Lab Results  Component Value Date   HDL 51 06/08/2019   Lab Results  Component Value Date   LDLCALC 92 06/08/2019   Lab Results  Component Value Date   TRIG 72 06/08/2019   Lab Results  Component Value Date   CHOLHDL 3.1 06/08/2019   Lab Results  Component Value Date   HGBA1C 5.4 11/29/2017       Assessment & Plan:   Problem List Items Addressed This Visit    None    Visit Diagnoses    Vertigo    -  Primary   Relevant Medications   meclizine (ANTIVERT) 25 MG tablet   Keratosis       Relevant Orders   Ambulatory referral to Dermatology       Meds ordered this encounter  Medications  . meclizine (ANTIVERT) 25 MG tablet    Sig: Take 1 tablet (25 mg  total) by mouth 3 (three) times daily as needed for dizziness.    Dispense:  30 tablet    Refill:  0    Order Specific Question:   Supervising Provider    Answer:   Tresa Garter [id number]     Vevelyn Francois, NP

## 2019-11-02 NOTE — Patient Instructions (Addendum)
Benign Positional Vertigo Vertigo is the feeling that you or your surroundings are moving when they are not. Benign positional vertigo is the most common form of vertigo. This is usually a harmless condition (benign). This condition is positional. This means that symptoms are triggered by certain movements and positions. This condition can be dangerous if it occurs while you are doing something that could cause harm to you or others. This includes activities such as driving or operating machinery. What are the causes? In many cases, the cause of this condition is not known. It may be caused by a disturbance in an area of the inner ear that helps your brain to sense movement and balance. This disturbance can be caused by:  Viral infection (labyrinthitis).  Head injury.  Repetitive motion, such as jumping, dancing, or running. What increases the risk? You are more likely to develop this condition if:  You are a woman.  You are 50 years of age or older. What are the signs or symptoms? Symptoms of this condition usually happen when you move your head or your eyes in different directions. Symptoms may start suddenly, and usually last for less than a minute. They include:  Loss of balance and falling.  Feeling like you are spinning or moving.  Feeling like your surroundings are spinning or moving.  Nausea and vomiting.  Blurred vision.  Dizziness.  Involuntary eye movement (nystagmus). Symptoms can be mild and cause only minor problems, or they can be severe and interfere with daily life. Episodes of benign positional vertigo may return (recur) over time. Symptoms may improve over time. How is this diagnosed? This condition may be diagnosed based on:  Your medical history.  Physical exam of the head, neck, and ears.  Tests, such as: ? MRI. ? CT scan. ? Eye movement tests. Your health care provider may ask you to change positions quickly while he or she watches you for symptoms  of benign positional vertigo, such as nystagmus. Eye movement may be tested with a variety of exams that are designed to evaluate or stimulate vertigo. ? An electroencephalogram (EEG). This records electrical activity in your brain. ? Hearing tests. You may be referred to a health care provider who specializes in ear, nose, and throat (ENT) problems (otolaryngologist) or a provider who specializes in disorders of the nervous system (neurologist). How is this treated?  This condition may be treated in a session in which your health care provider moves your head in specific positions to adjust your inner ear back to normal. Treatment for this condition may take several sessions. Surgery may be needed in severe cases, but this is rare. In some cases, benign positional vertigo may resolve on its own in 2-4 weeks. Follow these instructions at home: Safety  Move slowly. Avoid sudden body or head movements or certain positions, as told by your health care provider.  Avoid driving until your health care provider says it is safe for you to do so.  Avoid operating heavy machinery until your health care provider says it is safe for you to do so.  Avoid doing any tasks that would be dangerous to you or others if vertigo occurs.  If you have trouble walking or keeping your balance, try using a cane for stability. If you feel dizzy or unstable, sit down right away.  Return to your normal activities as told by your health care provider. Ask your health care provider what activities are safe for you. General instructions  Take over-the-counter   and prescription medicines only as told by your health care provider.  Drink enough fluid to keep your urine pale yellow.  Keep all follow-up visits as told by your health care provider. This is important. Contact a health care provider if:  You have a fever.  Your condition gets worse or you develop new symptoms.  Your family or friends notice any  behavioral changes.  You have nausea or vomiting that gets worse.  You have numbness or a "pins and needles" sensation. Get help right away if you:  Have difficulty speaking or moving.  Are always dizzy.  Faint.  Develop severe headaches.  Have weakness in your legs or arms.  Have changes in your hearing or vision.  Develop a stiff neck.  Develop sensitivity to light. Summary  Vertigo is the feeling that you or your surroundings are moving when they are not. Benign positional vertigo is the most common form of vertigo.  The cause of this condition is not known. It may be caused by a disturbance in an area of the inner ear that helps your brain to sense movement and balance.  Symptoms include loss of balance and falling, feeling that you or your surroundings are moving, nausea and vomiting, and blurred vision.  This condition can be diagnosed based on symptoms, physical exam, and other tests, such as MRI, CT scan, eye movement tests, and hearing tests.  Follow safety instructions as told by your health care provider. You will also be told when to contact your health care provider in case of problems. This information is not intended to replace advice given to you by your health care provider. Make sure you discuss any questions you have with your health care provider. Document Revised: 01/01/2018 Document Reviewed: 01/01/2018 Elsevier Patient Education  [place].  Actinic Keratosis An actinic keratosis is a precancerous growth on the skin. If there is more than one growth, the condition is called actinic keratoses. Actinic keratoses appear most often on areas of skin that get a lot of sun exposure, including the scalp, face, ears, lips, upper back, forearms, and the backs of the hands. If left untreated, these growths may develop into a skin cancer called squamous cell carcinoma. It is important to have all these growths checked by a health care provider to determine  the best treatment approach. What are the causes? Actinic keratoses are caused by getting too much ultraviolet (UV) radiation from the sun or other UV light sources. What increases the risk? You are more likely to develop this condition if you:  Have light-colored skin and blue eyes.  Have blond or red hair.  Spend a lot of time in the sun.  Do not protect your skin from the sun when outdoors.  Are an older person. The risk of developing an actinic keratosis increases with age. What are the signs or symptoms? Actinic keratoses feel like scaly, rough spots of skin. Symptoms of this condition include growths that may:  Be as small as a pinhead or as big as a quarter.  Itch, hurt, or feel sensitive.  Be skin-colored, light tan, dark tan, pink, or a combination of any of these colors. In most cases, the growths become red.  Have a small piece of pink or gray skin (skin tag) growing from them. It may be easier to notice actinic keratoses by feeling them, rather than seeing them. Sometimes, actinic keratoses disappear, but many reappear a few days to a few weeks later. How is this  diagnosed? This condition is usually diagnosed with a physical exam.  A tissue sample may be removed from the actinic keratosis and examined under a microscope (biopsy). How is this treated? If needed, this condition may be treated by:  Scraping off the actinic keratosis (curettage).  Freezing the actinic keratosis with liquid nitrogen (cryosurgery). This causes the growth to eventually fall off the skin.  Applying medicated creams or gels to destroy the cells in the growth.  Applying chemicals to the actinic keratosis to make the outer layers of skin peel off (chemical peel).  Using photodynamic therapy. In this procedure, medicated cream is applied to the actinic keratosis. This cream increases your skin's sensitivity to light. Then, a strong light is aimed at the actinic keratosis to destroy cells in  the growth. Follow these instructions at home: Skin care  Apply cool, wet cloths (cool compresses) to the affected areas.  Do not scratch your skin.  Check your skin regularly for any growths, especially growths that: ? Start to itch or bleed. ? Change in size, shape, or color. Caring for the treated area  Keep the treated area clean and dry as told by your health care provider.  Do not apply any medicine, cream, or lotion to the treated area unless your health care provider tells you to do that.  Do not pick at blisters or try to break them open. This can cause infection and scarring.  If you have red or irritated skin after treatment, follow instructions from your health care provider about how to take care of the treated area. Make sure you: ? Wash your hands with soap and water before you change your bandage (dressing). If soap and water are not available, use hand sanitizer. ? Change your dressing as told by your health care provider.  If you have red or irritated skin after treatment, check your treated area every day for signs of infection. Check for: ? Redness, swelling, or pain. ? Fluid or blood. ? Warmth. ? Pus or a bad smell. General instructions  Take or apply over-the-counter and prescription medicines only as told by your health care provider.  Return to your normal activities as told by your health care provider. Ask your health care provider what activities are safe for you.  Have a skin exam done every year by a health care provider who is a skin specialist (dermatologist).  Keep all follow-up visits as told by your health care provider. This is important. Lifestyle  Do not use any products that contain nicotine or tobacco, such as cigarettes and e-cigarettes. If you need help quitting, ask your health care provider.  Take steps to protect your skin from the sun. ? Try to avoid the sun between 10:00 a.m. and 4:00 p.m. This is when the UV light is the  strongest. ? Use a sunscreen or sunblock with SPF 30 (sun protection factor 30) or greater. ? Apply sunscreen before you are exposed to sunlight and reapply as often as directed by the instructions on the sunscreen container. ? Always wear sunglasses that have UV protection, and always wear a hat and clothing to protect your skin from sunlight. ? When possible, avoid medicines that increase your sensitivity to sunlight. ? Do not use tanning beds or other indoor tanning devices. Contact a health care provider if:  You notice any changes or new growths on your skin.  You have swelling, pain, or more redness around your treated area.  You have fluid or blood coming from  your treated area.  Your treated area feels warm to the touch.  You have pus or a bad smell coming from your treated area.  You have a fever.  You have a blister that becomes large and painful. Summary  An actinic keratosis is a precancerous growth on the skin. If there is more than one growth, the condition is called actinic keratoses. In some cases, if left untreated, these growths can develop into skin cancer.  Check your skin regularly for any growths, especially growths that start to itch or bleed, or change in size, shape, or color.  Take steps to protect your skin from the sun.  Contact a health care provider if you notice any changes or new growths on your skin.  Keep all follow-up visits as told by your health care provider. This is important. This information is not intended to replace advice given to you by your health care provider. Make sure you discuss any questions you have with your health care provider. Document Revised: 12/03/2017 Document Reviewed: 12/03/2017 Elsevier Patient Education  [place].

## 2019-11-12 MED FILL — ?ATORVASTATIN 20 MG TABLET: 20 | 30 days supply | Qty: 30 | Fill #5

## 2019-12-17 ENCOUNTER — Encounter: Payer: Self-pay | Admitting: Nurse Practitioner

## 2019-12-17 ENCOUNTER — Other Ambulatory Visit: Payer: Self-pay

## 2019-12-17 ENCOUNTER — Ambulatory Visit (INDEPENDENT_AMBULATORY_CARE_PROVIDER_SITE_OTHER): Payer: Self-pay | Admitting: Nurse Practitioner

## 2019-12-17 VITALS — BP 109/73 | HR 80 | Temp 97.8°F | Ht 61.0 in | Wt 130.0 lb

## 2019-12-17 DIAGNOSIS — R82998 Other abnormal findings in urine: Secondary | ICD-10-CM

## 2019-12-17 DIAGNOSIS — R319 Hematuria, unspecified: Secondary | ICD-10-CM

## 2019-12-17 DIAGNOSIS — G8929 Other chronic pain: Secondary | ICD-10-CM

## 2019-12-17 DIAGNOSIS — M5440 Lumbago with sciatica, unspecified side: Secondary | ICD-10-CM

## 2019-12-17 LAB — POCT URINALYSIS DIPSTICK
Bilirubin, UA: NEGATIVE
Glucose, UA: NEGATIVE
Ketones, UA: NEGATIVE
Nitrite, UA: NEGATIVE
Protein, UA: NEGATIVE
Spec Grav, UA: 1.015 (ref 1.010–1.025)
Urobilinogen, UA: 0.2 E.U./dL
pH, UA: 6 (ref 5.0–8.0)

## 2019-12-17 MED ORDER — NITROFURANTOIN MONOHYD MACRO 100 MG PO CAPS
100.0000 mg | ORAL_CAPSULE | Freq: Two times a day (BID) | ORAL | 0 refills | Status: AC
Start: 2019-12-17 — End: 2019-12-22

## 2019-12-17 NOTE — Patient Instructions (Signed)
Urinary Tract Infection, Adult A urinary tract infection (UTI) is an infection of any part of the urinary tract. The urinary tract includes:  The kidneys.  The ureters.  The bladder.  The urethra. These organs make, store, and get rid of pee (urine) in the body. What are the causes? This is caused by germs (bacteria) in your genital area. These germs grow and cause swelling (inflammation) of your urinary tract. What increases the risk? You are more likely to develop this condition if:  You have a small, thin tube (catheter) to drain pee.  You cannot control when you pee or poop (incontinence).  You are female, and: ? You use these methods to prevent pregnancy:  A medicine that kills sperm (spermicide).  A device that blocks sperm (diaphragm). ? You have low levels of a female hormone (estrogen). ? You are pregnant.  You have genes that add to your risk.  You are sexually active.  You take antibiotic medicines.  You have trouble peeing because of: ? A prostate that is bigger than normal, if you are female. ? A blockage in the part of your body that drains pee from the bladder (urethra). ? A kidney stone. ? A nerve condition that affects your bladder (neurogenic bladder). ? Not getting enough to drink. ? Not peeing often enough.  You have other conditions, such as: ? Diabetes. ? A weak disease-fighting system (immune system). ? Sickle cell disease. ? Gout. ? Injury of the spine. What are the signs or symptoms? Symptoms of this condition include:  Needing to pee right away (urgently).  Peeing often.  Peeing small amounts often.  Pain or burning when peeing.  Blood in the pee.  Pee that smells bad or not like normal.  Trouble peeing.  Pee that is cloudy.  Fluid coming from the vagina, if you are female.  Pain in the belly or lower back. Other symptoms include:  Throwing up (vomiting).  No urge to eat.  Feeling mixed up (confused).  Being tired  and grouchy (irritable).  A fever.  Watery poop (diarrhea). How is this treated? This condition may be treated with:  Antibiotic medicine.  Other medicines.  Drinking enough water. Follow these instructions at home:  Medicines  Take over-the-counter and prescription medicines only as told by your doctor.  If you were prescribed an antibiotic medicine, take it as told by your doctor. Do not stop taking it even if you start to feel better. General instructions  Make sure you: ? Pee until your bladder is empty. ? Do not hold pee for a long time. ? Empty your bladder after sex. ? Wipe from front to back after pooping if you are a female. Use each tissue one time when you wipe.  Drink enough fluid to keep your pee pale yellow.  Keep all follow-up visits as told by your doctor. This is important. Contact a doctor if:  You do not get better after 1-2 days.  Your symptoms go away and then come back. Get help right away if:  You have very bad back pain.  You have very bad pain in your lower belly.  You have a fever.  You are sick to your stomach (nauseous).  You are throwing up. Summary  A urinary tract infection (UTI) is an infection of any part of the urinary tract.  This condition is caused by germs in your genital area.  There are many risk factors for a UTI. These include having a small, thin   tube to drain pee and not being able to control when you pee or poop.  Treatment includes antibiotic medicines for germs.  Drink enough fluid to keep your pee pale yellow. This information is not intended to replace advice given to you by your health care provider. Make sure you discuss any questions you have with your health care provider. Document Revised: 07/10/2018 Document Reviewed: 01/30/2018 Elsevier Patient Education  2020 Elsevier Inc.  Hematuria, Adult Hematuria is blood in the urine. Blood may be visible in the urine, or it may be identified with a test.  This condition can be caused by infections of the bladder, urethra, kidney, or prostate. Other possible causes include:  Kidney stones.  Cancer of the urinary tract.  Too much calcium in the urine.  Conditions that are passed from parent to child (inherited conditions).  Exercise that requires a lot of energy. Infections can usually be treated with medicine, and a kidney stone usually will pass through your urine. If neither of these is the cause of your hematuria, more tests may be needed to identify the cause of your symptoms. It is very important to tell your health care provider about any blood in your urine, even if it is painless or the blood stops without treatment. Blood in the urine, when it happens and then stops and then happens again, can be a symptom of a very serious condition, including cancer. There is no pain in the initial stages of many urinary cancers. Follow these instructions at home: Medicines  Take over-the-counter and prescription medicines only as told by your health care provider.  If you were prescribed an antibiotic medicine, take it as told by your health care provider. Do not stop taking the antibiotic even if you start to feel better. Eating and drinking  Drink enough fluid to keep your urine clear or pale yellow. It is recommended that you drink 3-4 quarts (2.8-3.8 L) a day. If you have been diagnosed with an infection, it is recommended that you drink cranberry juice in addition to large amounts of water.  Avoid caffeine, tea, and carbonated beverages. These tend to irritate the bladder.  Avoid alcohol because it may irritate the prostate (men). General instructions  If you have been diagnosed with a kidney stone, follow your health care provider's instructions about straining your urine to catch the stone.  Empty your bladder often. Avoid holding urine for long periods of time.  If you are female: ? After a bowel movement, wipe from front to back  and use each piece of toilet paper only once. ? Empty your bladder before and after sex.  Pay attention to any changes in your symptoms. Tell your health care provider about any changes or any new symptoms.  It is your responsibility to get your test results. Ask your health care provider, or the department performing the test, when your results will be ready.  Keep all follow-up visits as told by your health care provider. This is important. Contact a health care provider if:  You develop back pain.  You have a fever.  You have nausea or vomiting.  Your symptoms do not improve after 3 days.  Your symptoms get worse. Get help right away if:  You develop severe vomiting and are unable take medicine without vomiting.  You develop severe pain in your back or abdomen even though you are taking medicine.  You pass a large amount of blood in your urine.  You pass blood clots in your   urine.  You feel very weak or like you might faint.  You faint. Summary  Hematuria is blood in the urine. It has many possible causes.  It is very important that you tell your health care provider about any blood in your urine, even if it is painless or the blood stops without treatment.  Take over-the-counter and prescription medicines only as told by your health care provider.  Drink enough fluid to keep your urine clear or pale yellow. This information is not intended to replace advice given to you by your health care provider. Make sure you discuss any questions you have with your health care provider. Document Revised: 12/17/2018 Document Reviewed: 08/25/2016 Elsevier Patient Education  2020 Elsevier Inc.  

## 2019-12-17 NOTE — Progress Notes (Signed)
[place], [place]  [address]                                                                                Phone:  [phone]   Fax:  [phone]    Subjective:    Maylie Coday is a 54 y.o. female who presents for evaluation of low back pain. The patient has had recurrent self limited episodes of low back pain in the past and previous osteoarthritis of lumbar spine. Symptoms have been present for 5 day and are gradually improving.  Onset was related to / precipitated by no known injury. The pain is located in the left lumbar area and radiates to the left abdomen. The pain is described as aching and occurs intermittently. She is currently in no pain. Symptoms are exacerbated by lying down. Symptoms are improved by heat and NSAIDs. She has also tried rest which provided no symptom relief. She has urinary frequency associated with the back pain. The patient has no "red flag" history indicative of complicated back pain. She admits that the symptoms have improved.  She admits that she did try to drink some cranberry juice.  She discontinued the use of soft drinks and increased her water intake.  She has had a history of hematuria x1 however work-up was negative.  She denies previous history of kidney stones or recurrent UTIs. Denies headache, dizziness, visual changes, shortness of breath, dyspnea on exertion, chest pain, nausea, vomiting or any edema.    No Known Allergies  Review of Systems  All other systems reviewed and are negative.     Objective:    Physical Exam Constitutional:      General: She is not in acute distress.    Appearance: Normal appearance. She is not ill-appearing.  HENT:     Head: Normocephalic.  Cardiovascular:     Rate and Rhythm: Normal rate and regular rhythm.     Heart sounds: Normal heart sounds.  Pulmonary:     Effort: Pulmonary effort is normal.     Breath sounds: Normal breath sounds.    Musculoskeletal:        General: No swelling or tenderness. Normal range of motion.     Cervical back: Normal range of motion.     Thoracic back: Normal.     Lumbar back: Normal.  Skin:    General: Skin is warm and dry.  Neurological:     General: No focal deficit present.     Mental Status: She is alert and oriented to person, place, and time.  Psychiatric:        Mood and Affect: Mood normal.        Behavior: Behavior normal.        Thought Content: Thought content normal.        Judgment: Judgment normal.     BP 109/73 (BP Location: Left Arm)   Pulse 80   Temp 97.8 F (36.6 C)   Ht 5\' 1"  (1.549 m)   Wt 130 lb (59 kg)   LMP 09/20/2013 (LMP Unknown)   SpO2 99%   BMI 24.56 kg/m  Wt Readings from  Last 3 Encounters:  12/17/19 130 lb (59 kg)  11/02/19 121 lb (54.9 kg)  10/20/19 119 lb (54 kg)      Assessment & Plan:   Problem List Items Addressed This Visit      Medium   Chronic back pain - Primary   Relevant Orders   POCT Urinalysis Dipstick (Completed)    Other Visit Diagnoses    Acute right-sided low back pain with sciatica, sciatica laterality unspecified       Hematuria, unspecified type       Relevant Orders   Urine Culture   Leukocytes in urine       Relevant Orders   Urine Culture NSAIDs per medication orders. OTC analgesics as needed. Anbx therapy and increased water intake and try cranberry.      Meds ordered this encounter  Medications  . nitrofurantoin, macrocrystal-monohydrate, (MACROBID) 100 MG capsule    Sig: Take 1 capsule (100 mg total) by mouth 2 (two) times daily for 5 days.    Dispense:  10 capsule    Refill:  0    Order Specific Question:   Supervising Provider    Answer:   Tresa Garter [id number]     Vevelyn Francois, NP

## 2019-12-19 LAB — URINE CULTURE

## 2019-12-23 ENCOUNTER — Other Ambulatory Visit: Payer: Self-pay

## 2019-12-23 ENCOUNTER — Ambulatory Visit (INDEPENDENT_AMBULATORY_CARE_PROVIDER_SITE_OTHER): Payer: Self-pay | Admitting: Physician Assistant

## 2019-12-23 ENCOUNTER — Encounter: Payer: Self-pay | Admitting: Physician Assistant

## 2019-12-23 DIAGNOSIS — D485 Neoplasm of uncertain behavior of skin: Secondary | ICD-10-CM

## 2019-12-23 NOTE — Progress Notes (Signed)
   New Patient Visit  Subjective  Brandi Molina is a 54 y.o. female who presents for the following: Annual Exam (left chest-scaly spot x months & left thigh-scaly spot x months). Spot on left chest that has been present one year. It itches and she has scratched it but it doesn't clear. It is not sore.  Left thigh lesion scales but doesn't itch. It isn't sore. It appears to be the same as the chest. She works in her yard a lot and goes to [employer]. She has never had a skin cancer and she doesn't know of any family history of skin cancer. Mole on her right breast that has been present her entire life.   Objective  Well appearing patient in no apparent distress; mood and affect are within normal limits.  A full examination was performed including head, eyes, ears, nose, lips, neck, chest, axillae, abdomen, back, buttocks, bilateral upper extremities, bilateral lower extremities, hands, feet, fingers, toes, fingernails, and toenails. All findings within normal limits unless otherwise noted below. No suspicious moles noted on back.  Objective  Left Breast: Crusted lesion left chest  Objective  Left Thigh - Anterior: Warty papule  Assessment & Plan  Neoplasm of uncertain behavior of skin (2) Left Breast  Left Thigh - Anterior  Suggested biopsy but pt does not have insurance and will need to come back and see Dr Denna Haggard  Advised chest may be SCC and should be biopsied in the near future.

## 2020-01-12 ENCOUNTER — Other Ambulatory Visit: Payer: Self-pay | Admitting: Family Medicine

## 2020-01-12 DIAGNOSIS — I7409 Other arterial embolism and thrombosis of abdominal aorta: Secondary | ICD-10-CM

## 2020-01-12 MED FILL — ?ATORVASTATIN 20 MG TABLET: 20 | 30 days supply | Qty: 30 | Fill #0

## 2020-01-28 ENCOUNTER — Encounter: Payer: Self-pay | Admitting: Nurse Practitioner

## 2020-01-28 ENCOUNTER — Other Ambulatory Visit: Payer: Self-pay

## 2020-01-28 ENCOUNTER — Ambulatory Visit (INDEPENDENT_AMBULATORY_CARE_PROVIDER_SITE_OTHER): Payer: Self-pay | Admitting: Nurse Practitioner

## 2020-01-28 VITALS — BP 109/55 | HR 85 | Temp 98.0°F | Ht 61.0 in | Wt 122.0 lb

## 2020-01-28 DIAGNOSIS — Z0000 Encounter for general adult medical examination without abnormal findings: Secondary | ICD-10-CM

## 2020-01-28 DIAGNOSIS — R7989 Other specified abnormal findings of blood chemistry: Secondary | ICD-10-CM

## 2020-01-28 DIAGNOSIS — F411 Generalized anxiety disorder: Secondary | ICD-10-CM

## 2020-01-28 DIAGNOSIS — E782 Mixed hyperlipidemia: Secondary | ICD-10-CM

## 2020-01-28 DIAGNOSIS — N951 Menopausal and female climacteric states: Secondary | ICD-10-CM

## 2020-01-28 NOTE — Progress Notes (Signed)
[place], [place]  [address] Phone:  [phone]   Fax:  [phone]   Established Patient Office Visit  Subjective:  Patient ID: Brandi Molina, female    DOB: 31-May-1966  Age: 54 y.o. MRN: [id number]  CC:  Chief Complaint  Patient presents with  . Follow-up    no concerns; just her to get labs     HPI Brandi Molina presents for follow up. She  has a past medical history of Allergy, Anxiety, Bartholin gland cyst (2008), Depression, and Hyperlipidemia.   Depression Patient complains of depression. She complains of depressed mood and anxiety. Onset was approximately several years ago. Symptoms have been gradually worsening since that time. Current symptoms include: stable denies any depression or anxiety; controlled on current regimen. Patient denies fatigue, insomnia, recurrent thoughts of death, suicidal attempt and suicidal thoughts with specific plan. Family history significant for heart disease. Possible organic causes contributing are: none. Risk factors: previous episode of depression. Previous treatment includes medication. She complains of the following side effects from the treatment: none.   Dyslipidemia Patient presents for evaluation of lipids. Compliance with treatment thus far has been good. A repeat fasting lipid profile was done. The patient does not use medications that may worsen dyslipidemias (corticosteroids, progestins, anabolic steroids, diuretics, beta-blockers, amiodarone, cyclosporine, olanzapine). The patient exercises infrequently.  The patient is not known to have coexisting coronary artery disease.   Cardiac Risk Factors Age > 45-female, > 55-female:  NO  Smoking:   YES  +1  Sig. family hx of CHD*:  YES  +1  Hypertension:   NO  Diabetes:   NO  HDL < 35:   NO  HDL > 59:   NO  Total: 2  *- Significant family history of Coronary Heart Disease per National Cholesterol Education Program =  Myocardial Infarction or sudden death at less than 83 years old in  father or other 1st-degree female relative, or less than 60 years old in mother or  other 1st-degree female relative.  Past Medical History:  Diagnosis Date  . Allergy   . Anxiety   . Bartholin gland cyst 2008  . Depression   . Hyperlipidemia     Past Surgical History:  Procedure Laterality Date  . BREAST BIOPSY Left 2020  . CHOLECYSTECTOMY  1999    Family History  Problem Relation Age of Onset  . Diabetes Mother   . Heart disease Mother   . Heart attack Mother   . AAA (abdominal aortic aneurysm) Mother   . COPD Mother   . Cancer Father        lung  . Hypertension Brother   . Cancer - Other Brother        pallate cancer  . Diabetes Brother   . Anesthesia problems Neg Hx   . Hypotension Neg Hx   . Malignant hyperthermia Neg Hx   . Pseudochol deficiency Neg Hx   . Colon cancer Neg Hx   . Stomach cancer Neg Hx   . Rectal cancer Neg Hx   . Esophageal cancer Neg Hx   . Breast cancer Neg Hx     Social History   Socioeconomic History  . Marital status: Widowed    Spouse name: Not on file  . Number of children: 1  . Years of education: Not on file  . Highest education level: 12th grade  Occupational History  . Not on file  Tobacco Use  .  Smoking status: Current Every Day Smoker    Packs/day: 1.00    Years: 20.00    Pack years: 20.00    Types: Cigarettes  . Smokeless tobacco: Never Used  Vaping Use  . Vaping Use: Never used  Substance and Sexual Activity  . Alcohol use: No    Alcohol/week: 0.0 standard drinks  . Drug use: No  . Sexual activity: Yes  Other Topics Concern  . Not on file  Social History Narrative  . Not on file   Social Determinants of Health   Financial Resource Strain:   . Difficulty of Paying Living Expenses:   Food Insecurity:   . Worried About Charity fundraiser in the Last Year:   . Arboriculturist in the Last Year:   Transportation Needs: No Transportation  Needs  . Lack of Transportation (Medical): No  . Lack of Transportation (Non-Medical): No  Physical Activity:   . Days of Exercise per Week:   . Minutes of Exercise per Session:   Stress:   . Feeling of Stress :   Social Connections:   . Frequency of Communication with Friends and Family:   . Frequency of Social Gatherings with Friends and Family:   . Attends Religious Services:   . Active Member of Clubs or Organizations:   . Attends Archivist Meetings:   Marland Kitchen Marital Status:   Intimate Partner Violence:   . Fear of Current or Ex-Partner:   . Emotionally Abused:   Marland Kitchen Physically Abused:   . Sexually Abused:     Outpatient Medications Prior to Visit  Medication Sig Dispense Refill  . acetaminophen (TYLENOL) 500 MG tablet Take 1 tablet (500 mg total) by mouth every 6 (six) hours as needed. 30 tablet 0  . aspirin 81 MG tablet Take 81 mg by mouth daily.    Marland Kitchen atorvastatin (LIPITOR) 20 MG tablet TAKE 1 TABLET (20 MG TOTAL) BY MOUTH DAILY. 30 tablet 6  . cetirizine (ZYRTEC) 10 MG tablet Take 1 tablet (10 mg total) by mouth daily. 30 tablet 11  . Cholecalciferol (VITAMIN D) 50 MCG (2000 UT) CAPS Take 1 capsule (2,000 Units total) by mouth daily. 30 capsule 6  . fluticasone (FLONASE) 50 MCG/ACT nasal spray Place 2 sprays into both nostrils daily. 16 g 6  . lidocaine (XYLOCAINE) 2 % solution Apply to affected area as needed for pain 30 mL 1  . meclizine (ANTIVERT) 25 MG tablet Take 1 tablet (25 mg total) by mouth 3 (three) times daily as needed for dizziness. 30 tablet 0  . Multiple Vitamin (MULTIVITAMIN) tablet Take 1 tablet by mouth daily.    . naproxen (NAPROSYN) 375 MG tablet Take 1 tablet (375 mg total) by mouth 2 (two) times daily. 30 tablet 6  . nystatin cream (MYCOSTATIN) Apply 1 application topically 2 (two) times daily. 30 g 3  . sertraline (ZOLOFT) 50 MG tablet Take 1 tablet (50 mg total) by mouth daily. 30 tablet 6   Facility-Administered Medications Prior to Visit    Medication Dose Route Frequency Provider Last Rate Last Admin  . lidocaine (XYLOCAINE) 0.5 % (with pres) injection 5 mL  5 mL Intradermal Once Dorena Dew, FNP        No Known Allergies  ROS Review of Systems  Constitutional: Negative.   HENT: Positive for ear pain.   Eyes: Negative.   Respiratory: Negative.   Cardiovascular: Negative.   Gastrointestinal: Negative.   Endocrine: Negative.   Genitourinary: Negative.  Musculoskeletal: Negative.   Skin: Negative.   Allergic/Immunologic: Negative.   Neurological: Positive for dizziness.  Hematological: Negative.   Psychiatric/Behavioral: Negative.       Objective:    Physical Exam Constitutional:      Appearance: Normal appearance. She is normal weight.  HENT:     Head: Normocephalic.     Right Ear: Tympanic membrane, ear canal and external ear normal.     Left Ear: Tympanic membrane, ear canal and external ear normal.     Nose: Nose normal.     Mouth/Throat:     Mouth: Mucous membranes are moist.  Cardiovascular:     Rate and Rhythm: Normal rate and regular rhythm.     Pulses: Normal pulses.     Heart sounds: Normal heart sounds.  Pulmonary:     Effort: Pulmonary effort is normal.     Breath sounds: Normal breath sounds.  Abdominal:     Palpations: Abdomen is soft.  Musculoskeletal:        General: Normal range of motion.     Cervical back: Normal range of motion.  Skin:    General: Skin is warm and dry.  Neurological:     General: No focal deficit present.     Mental Status: She is alert and oriented to person, place, and time.  Psychiatric:        Mood and Affect: Mood normal.        Behavior: Behavior normal.        Thought Content: Thought content normal.        Judgment: Judgment normal.     BP (!) 109/55 (BP Location: Left Arm, Patient Position: Sitting, Cuff Size: Small)   Pulse 85   Temp 98 F (36.7 C)   Ht 5\' 1"  (1.549 m)   Wt 122 lb (55.3 kg)   LMP 09/20/2013 (LMP Unknown)   SpO2 98%    BMI 23.05 kg/m  Wt Readings from Last 3 Encounters:  01/28/20 122 lb (55.3 kg)  12/17/19 130 lb (59 kg)  11/02/19 121 lb (54.9 kg)     Health Maintenance Due  Topic Date Due  . Hepatitis C Screening  Never done  . URINE MICROALBUMIN  Never done  . COVID-19 Vaccine (1) Never done    There are no preventive care reminders to display for this patient.  Lab Results  Component Value Date   TSH 0.436 (L) 06/08/2019   Lab Results  Component Value Date   WBC 15.1 (H) 06/08/2019   HGB 15.2 06/08/2019   HCT 43.1 06/08/2019   MCV 92 06/08/2019   PLT 378 06/08/2019   Lab Results  Component Value Date   NA CANCELED 06/08/2019   K CANCELED 06/08/2019   CO2 17 (L) 06/08/2019   GLUCOSE 96 06/08/2019   BUN 9 06/08/2019   CREATININE 0.62 06/08/2019   BILITOT 0.5 06/08/2019   ALKPHOS 117 06/08/2019   AST 25 06/08/2019   ALT 17 06/08/2019   PROT 7.2 06/08/2019   ALBUMIN 4.4 06/08/2019   CALCIUM 9.6 06/08/2019   ANIONGAP 10 08/27/2015   Lab Results  Component Value Date   CHOL 157 06/08/2019   Lab Results  Component Value Date   HDL 51 06/08/2019   Lab Results  Component Value Date   LDLCALC 92 06/08/2019   Lab Results  Component Value Date   TRIG 72 06/08/2019   Lab Results  Component Value Date   CHOLHDL 3.1 06/08/2019   Lab Results  Component  Value Date   HGBA1C 5.4 11/29/2017      Assessment & Plan:   Problem List Items Addressed This Visit      Other   Generalized anxiety disorder - Primary   Relevant Orders   Comp. Metabolic Panel (12) We will continue with current regimen sertraline 50 mg daily    Other Visit Diagnoses    Healthcare maintenance       Relevant Orders   Estrogens, total   FSH/LH   Progesterone TSH   Menopausal hot flushes       Relevant Orders   Estrogens, total   FSH/LH   Progesterone TSH   Mixed hyperlipidemia       Relevant Orders   Comp. Metabolic Panel (12)   Lipid panel We will continue with current regimen  atorvastatin 20 mg daily encourage patient to exercise regularly       No orders of the defined types were placed in this encounter.   Follow-up: Return in about 6 months (around 07/29/2020).    Vevelyn Francois, NP

## 2020-01-28 NOTE — Patient Instructions (Signed)
Menopause Menopause is the normal time of life when menstrual periods stop completely. It is usually confirmed by 12 months without a menstrual period. The transition to menopause (perimenopause) most often happens between the ages of 45 and 55. During perimenopause, hormone levels change in your body, which can cause symptoms and affect your health. Menopause may increase your risk for:  Loss of bone (osteoporosis), which causes bone breaks (fractures).  Depression.  Hardening and narrowing of the arteries (atherosclerosis), which can cause heart attacks and strokes. What are the causes? This condition is usually caused by a natural change in hormone levels that happens as you get older. The condition may also be caused by surgery to remove both ovaries (bilateral oophorectomy). What increases the risk? This condition is more likely to start at an earlier age if you have certain medical conditions or treatments, including:  A tumor of the pituitary gland in the brain.  A disease that affects the ovaries and hormone production.  Radiation treatment for cancer.  Certain cancer treatments, such as chemotherapy or hormone (anti-estrogen) therapy.  Heavy smoking and excessive alcohol use.  Family history of early menopause. This condition is also more likely to develop earlier in women who are very thin. What are the signs or symptoms? Symptoms of this condition include:  Hot flashes.  Irregular menstrual periods.  Night sweats.  Changes in feelings about sex. This could be a decrease in sex drive or an increased comfort around your sexuality.  Vaginal dryness and thinning of the vaginal walls. This may cause painful intercourse.  Dryness of the skin and development of wrinkles.  Headaches.  Problems sleeping (insomnia).  Mood swings or irritability.  Memory problems.  Weight gain.  Hair growth on the face and chest.  Bladder infections or problems with urinating. How  is this diagnosed? This condition is diagnosed based on your medical history, a physical exam, your age, your menstrual history, and your symptoms. Hormone tests may also be done. How is this treated? In some cases, no treatment is needed. You and your health care provider should make a decision together about whether treatment is necessary. Treatment will be based on your individual condition and preferences. Treatment for this condition focuses on managing symptoms. Treatment may include:  Menopausal hormone therapy (MHT).  Medicines to treat specific symptoms or complications.  Acupuncture.  Vitamin or herbal supplements. Before starting treatment, make sure to let your health care provider know if you have a personal or family history of:  Heart disease.  Breast cancer.  Blood clots.  Diabetes.  Osteoporosis. Follow these instructions at home: Lifestyle  Do not use any products that contain nicotine or tobacco, such as cigarettes and e-cigarettes. If you need help quitting, ask your health care provider.  Get at least 30 minutes of physical activity on 5 or more days each week.  Avoid alcoholic and caffeinated beverages, as well as spicy foods. This may help prevent hot flashes.  Get 7-8 hours of sleep each night.  If you have hot flashes, try: ? Dressing in layers. ? Avoiding things that may trigger hot flashes, such as spicy food, warm places, or stress. ? Taking slow, deep breaths when a hot flash starts. ? Keeping a fan in your home and office.  Find ways to manage stress, such as deep breathing, meditation, or journaling.  Consider going to group therapy with other women who are having menopause symptoms. Ask your health care provider about recommended group therapy meetings. Eating and   drinking  Eat a healthy, balanced diet that contains whole grains, lean protein, low-fat dairy, and plenty of fruits and vegetables.  Your health care provider may recommend  adding more soy to your diet. Foods that contain soy include tofu, tempeh, and soy milk.  Eat plenty of foods that contain calcium and vitamin D for bone health. Items that are rich in calcium include low-fat milk, yogurt, beans, almonds, sardines, broccoli, and kale. Medicines  Take over-the-counter and prescription medicines only as told by your health care provider.  Talk with your health care provider before starting any herbal supplements. If prescribed, take vitamins and supplements as told by your health care provider. These may include: ? Calcium. Women age 51 and older should get 1,200 mg (milligrams) of calcium every day. ? Vitamin D. Women need 600-800 International Units of vitamin D each day. ? Vitamins B12 and B6. Aim for 50 micrograms of B12 and 1.5 mg of B6 each day. General instructions  Keep track of your menstrual periods, including: ? When they occur. ? How heavy they are and how long they last. ? How much time passes between periods.  Keep track of your symptoms, noting when they start, how often you have them, and how long they last.  Use vaginal lubricants or moisturizers to help with vaginal dryness and improve comfort during sex.  Keep all follow-up visits as told by your health care provider. This is important. This includes any group therapy or counseling. Contact a health care provider if:  You are still having menstrual periods after age 55.  You have pain during sex.  You have not had a period for 12 months and you develop vaginal bleeding. Get help right away if:  You have: ? Severe depression. ? Excessive vaginal bleeding. ? Pain when you urinate. ? A fast or irregular heart beat (palpitations). ? Severe headaches. ? Abdomen (abdominal) pain or severe indigestion.  You fell and you think you have a broken bone.  You develop leg or chest pain.  You develop vision problems.  You feel a lump in your breast. Summary  Menopause is the normal  time of life when menstrual periods stop completely. It is usually confirmed by 12 months without a menstrual period.  The transition to menopause (perimenopause) most often happens between the ages of 45 and 55.  Symptoms can be managed through medicines, lifestyle changes, and complementary therapies such as acupuncture.  Eat a balanced diet that is rich in nutrients to promote bone health and heart health and to manage symptoms during menopause. This information is not intended to replace advice given to you by your health care provider. Make sure you discuss any questions you have with your health care provider. Document Revised: 07/05/2017 Document Reviewed: 08/25/2016 Elsevier Patient Education  2020 Elsevier Inc.  

## 2020-01-29 LAB — TSH: TSH: 1.3 u[IU]/mL (ref 0.450–4.500)

## 2020-01-29 LAB — SPECIMEN STATUS REPORT

## 2020-02-04 LAB — COMP. METABOLIC PANEL (12)
AST: 22 IU/L (ref 0–40)
Albumin/Globulin Ratio: 2.1 (ref 1.2–2.2)
Albumin: 4.2 g/dL (ref 3.8–4.9)
Alkaline Phosphatase: 104 IU/L (ref 48–121)
BUN/Creatinine Ratio: 15 (ref 9–23)
BUN: 10 mg/dL (ref 6–24)
Bilirubin Total: <0.2 mg/dL (ref 0.0–1.2)
Calcium: 9.1 mg/dL (ref 8.7–10.2)
Chloride: 109 mmol/L — ABNORMAL HIGH (ref 96–106)
Creatinine, Ser: 0.67 mg/dL (ref 0.57–1.00)
GFR calc Af Amer: 116 mL/min/{1.73_m2} (ref >59)
GFR calc non Af Amer: 101 mL/min/{1.73_m2} (ref >59)
Globulin, Total: 2 g/dL (ref 1.5–4.5)
Glucose: 90 mg/dL (ref 65–99)
Potassium: 4.1 mmol/L (ref 3.5–5.2)
Sodium: 146 mmol/L — ABNORMAL HIGH (ref 134–144)
Total Protein: 6.2 g/dL (ref 6.0–8.5)

## 2020-02-04 LAB — LIPID PANEL
Chol/HDL Ratio: 2.8 ratio (ref 0.0–4.4)
Cholesterol, Total: 121 mg/dL (ref 100–199)
HDL: 44 mg/dL (ref >39)
LDL Chol Calc (NIH): 61 mg/dL (ref 0–99)
Triglycerides: 82 mg/dL (ref 0–149)
VLDL Cholesterol Cal: 16 mg/dL (ref 5–40)

## 2020-02-04 LAB — ESTROGENS, TOTAL: Estrogen: 77 pg/mL

## 2020-02-04 LAB — PROGESTERONE: Progesterone: 0.2 ng/mL

## 2020-02-04 LAB — FSH/LH
FSH: 96.4 m[IU]/mL
LH: 51.8 m[IU]/mL

## 2020-02-09 ENCOUNTER — Ambulatory Visit: Payer: No Typology Code available for payment source | Admitting: Dermatology

## 2020-02-10 ENCOUNTER — Other Ambulatory Visit: Payer: Self-pay

## 2020-02-10 ENCOUNTER — Ambulatory Visit: Payer: Self-pay | Admitting: Dermatology

## 2020-02-12 ENCOUNTER — Other Ambulatory Visit: Payer: Self-pay | Admitting: Family Medicine

## 2020-02-12 ENCOUNTER — Other Ambulatory Visit: Payer: Self-pay | Admitting: Nurse Practitioner

## 2020-02-12 DIAGNOSIS — F411 Generalized anxiety disorder: Secondary | ICD-10-CM

## 2020-02-12 MED FILL — ?ATORVASTATIN 20 MG TABLET: 20 | 30 days supply | Qty: 30 | Fill #1

## 2020-02-12 MED FILL — ?CETIRIZINE HCL 10 MG TABLE: 10 | 30 days supply | Qty: 30 | Fill #7

## 2020-02-12 NOTE — Telephone Encounter (Signed)
Please review  Thank you

## 2020-02-15 MED FILL — SERTRALINE HCL 50 MG TABLET: 50 | 30 days supply | Qty: 30 | Fill #0

## 2020-02-16 ENCOUNTER — Ambulatory Visit (INDEPENDENT_AMBULATORY_CARE_PROVIDER_SITE_OTHER): Payer: Self-pay | Admitting: Dermatology

## 2020-02-16 ENCOUNTER — Other Ambulatory Visit: Payer: Self-pay

## 2020-02-16 DIAGNOSIS — L570 Actinic keratosis: Secondary | ICD-10-CM

## 2020-02-16 DIAGNOSIS — L821 Other seborrheic keratosis: Secondary | ICD-10-CM

## 2020-02-16 DIAGNOSIS — D229 Melanocytic nevi, unspecified: Secondary | ICD-10-CM

## 2020-02-16 DIAGNOSIS — D225 Melanocytic nevi of trunk: Secondary | ICD-10-CM

## 2020-02-16 NOTE — Patient Instructions (Addendum)
First visit to me for Brandi Molina date of birth [birth year] she showed me multiple spots on her legs chest and hand.  The crusty spots on the top of the right hand and larger one on the left chest represent solar keratoses and were treated with 5-second liquid nitrogen freeze.  She can expect that these will swell and may peel in the next 2 weeks.  The only care would be to avoid a sunburn since she plans on going to [facility] in a few days.  The other lesions were combination of small pigmented skin tag on the left chest and keratosis versus wart on the thighs plus on the center of the back. there are no atypical moles or lesions that currently needs skin biopsy.  We will schedule [name] to take a look at the 2 frozen spot sometime after the summer, but if they are quite smooth she may cancel that visit.  She knows that she could call me sooner if there is any issue.

## 2020-02-17 ENCOUNTER — Encounter: Payer: Self-pay | Admitting: Dermatology

## 2020-02-17 NOTE — Progress Notes (Addendum)
   Follow-Up Visit   Subjective  Brandi Molina is a 54 y.o. female who presents for the following: Skin Problem (Patient here today to have possible biopsy on chest, right hand and both thighs. Patient states the spots have been there x years.  Patient states the spot on her right hand was dry and itchy she scratched the spot and it flaked off.).  Growth Location: Legs and chest Duration:  Quality:  Associated Signs/Symptoms: Modifying Factors:  Severity:  Timing: Context:   Objective  Well appearing patient in no apparent distress; mood and affect are within normal limits.  Focused skin examination including head, neck, chest, back, arms, and legs.   Assessment & Plan   Patient Instructions  First visit to me for Brandi Molina date of birth [birth year] she showed me multiple spots on her legs chest and hand.  The crusty spots on the top of the right hand and larger one on the left chest represent solar keratoses and were treated with 5-second liquid nitrogen freeze.  She can expect that these will swell and may peel in the next 2 weeks.  The only care would be to avoid a sunburn since she plans on going to [facility] in a few days.  The other lesions were combination of small pigmented skin tag on the left chest and keratosis versus wart on the thighs plus on the center of the back. there are no atypical moles or lesions that currently needs skin biopsy.  We will schedule [name] to take a look at the 2 frozen spot sometime after the summer, but if they are quite smooth she may cancel that visit.  She knows that she could call me sooner if there is any issue.   AK (actinic keratosis) (2) Right Hand - Posterior; Left Breast  Destruction of lesion - Left Breast, Right Hand - Posterior Complexity: simple   Destruction method: cryotherapy   Informed consent: discussed and consent obtained   Timeout:  patient name, date of birth, surgical site, and procedure  verified Lesion destroyed using liquid nitrogen: Yes   Region frozen until ice ball extended beyond lesion: Yes   Cryotherapy cycles:  5 Outcome: patient tolerated procedure well with no complications    [place] Mid Back  Self examine skin twice annually.  Sun protect.  Seborrheic keratosis (3) Left Thigh - Anterior; Right Thigh - Anterior; Mid Back  Leave if stable     I, Brandi Monarch, MD, have reviewed all documentation for this visit.  The documentation on 02/21/20 for the exam, diagnosis, procedures, and orders are all accurate and complete.

## 2020-03-14 MED FILL — SERTRALINE HCL 50 MG TABLET: 50 | 30 days supply | Qty: 30 | Fill #1

## 2020-03-14 MED FILL — ATORVASTATIN CALCIUM 20 MG: 20 | 30 days supply | Qty: 30 | Fill #2

## 2020-03-14 MED FILL — ?CETIRIZINE HCL 10 MG TABLE: 10 | 30 days supply | Qty: 30 | Fill #8

## 2020-04-13 MED FILL — SERTRALINE HCL 50 MG TABLET: 50 | 30 days supply | Qty: 30 | Fill #2

## 2020-04-13 MED FILL — ?ATORVASTATIN 20 MG TABLET: 20 | 30 days supply | Qty: 30 | Fill #3

## 2020-05-16 MED FILL — ATORVASTATIN CALCIUM 20 MG: 20 | 30 days supply | Qty: 30 | Fill #4

## 2020-05-16 MED FILL — SERTRALINE HCL 50 MG TABLET: 50 | 30 days supply | Qty: 30 | Fill #3

## 2020-05-18 ENCOUNTER — Ambulatory Visit: Payer: No Typology Code available for payment source | Admitting: Dermatology

## 2020-06-16 MED FILL — SERTRALINE HCL 50 MG TABLET: 50 | 30 days supply | Qty: 30 | Fill #4

## 2020-06-16 MED FILL — ?ATORVASTATIN 20 MG TABLET: 20 | 30 days supply | Qty: 30 | Fill #5

## 2020-07-18 MED FILL — SERTRALINE HCL 50 MG TABLET: 50 | 30 days supply | Qty: 30 | Fill #5

## 2020-07-18 MED FILL — ?ATORVASTATIN 20 MG TABLET: 20 | 30 days supply | Qty: 30 | Fill #6

## 2020-07-25 ENCOUNTER — Ambulatory Visit: Payer: No Typology Code available for payment source | Admitting: Nurse Practitioner

## 2020-08-24 ENCOUNTER — Other Ambulatory Visit: Payer: Self-pay | Admitting: Family Medicine

## 2020-08-24 DIAGNOSIS — I7409 Other arterial embolism and thrombosis of abdominal aorta: Secondary | ICD-10-CM

## 2020-08-24 MED FILL — SERTRALINE HCL 50 MG TABLET: 50 | 30 days supply | Qty: 30 | Fill #6

## 2020-08-24 MED FILL — ?ATORVASTATIN 20 MG TABLET: 20 | 30 days supply | Qty: 30 | Fill #0

## 2020-09-12 ENCOUNTER — Encounter: Payer: Self-pay | Admitting: Nurse Practitioner

## 2020-09-12 ENCOUNTER — Other Ambulatory Visit: Payer: Self-pay | Admitting: Nurse Practitioner

## 2020-09-12 ENCOUNTER — Telehealth (INDEPENDENT_AMBULATORY_CARE_PROVIDER_SITE_OTHER): Payer: No Typology Code available for payment source | Admitting: Nurse Practitioner

## 2020-09-12 VITALS — Ht 61.0 in | Wt 120.0 lb

## 2020-09-12 DIAGNOSIS — R21 Rash and other nonspecific skin eruption: Secondary | ICD-10-CM

## 2020-09-12 DIAGNOSIS — U071 COVID-19: Secondary | ICD-10-CM

## 2020-09-12 HISTORY — DX: COVID-19: U07.1

## 2020-09-12 MED ORDER — HYDROXYZINE HCL 10 MG PO TABS
10.0000 mg | ORAL_TABLET | Freq: Three times a day (TID) | ORAL | 0 refills | Status: DC | PRN
Start: 2020-09-12 — End: 2020-09-12

## 2020-09-12 MED ORDER — METHYLPREDNISOLONE 4 MG PO TBPK
ORAL_TABLET | ORAL | 0 refills | Status: DC
Start: 2020-09-12 — End: 2020-09-12

## 2020-09-12 MED FILL — METHYLPREDNISOLONE 4 MG TAB: 4 | 6 days supply | Qty: 21 | Fill #0

## 2020-09-12 MED FILL — hydrOXYzine HCL 10 MG TABS: 10 | 10 days supply | Qty: 30 | Fill #0

## 2020-09-12 NOTE — Patient Instructions (Addendum)
Rash, Adult  A rash is a change in the color of your skin. A rash can also change the way your skin feels. There are many different conditions and factors that can cause a rash. Follow these instructions at home: The goal of treatment is to stop the itching and keep the rash from spreading. Watch for any changes in your symptoms. Let your doctor know about them. Follow these instructions to help with your condition: Medicine Take or apply over-the-counter and prescription medicines only as told by your doctor. These may include medicines:  To treat red or swollen skin (corticosteroid creams).  To treat itching.  To treat an allergy (oral antihistamines).  To treat very bad symptoms (oral corticosteroids).   Skin care  Put cool cloths (compresses) on the affected areas.  Do not scratch or rub your skin.  Avoid covering the rash. Make sure that the rash is exposed to air as much as possible. Managing itching and discomfort  Avoid hot showers or baths. These can make itching worse. A cold shower may help.  Try taking a bath with: ? Epsom salts. You can get these at your local pharmacy or grocery store. Follow the instructions on the package. ? Baking soda. Pour a small amount into the bath as told by your doctor. ? Colloidal oatmeal. You can get this at your local pharmacy or grocery store. Follow the instructions on the package.  Try putting baking soda paste onto your skin. Stir water into baking soda until it gets like a paste.  Try putting on a lotion that relieves itchiness (calamine lotion).  Keep cool and out of the sun. Sweating and being hot can make itching worse. General instructions  Rest as needed.  Drink enough fluid to keep your pee (urine) pale yellow.  Wear loose-fitting clothing.  Avoid scented soaps, detergents, and perfumes. Use gentle soaps, detergents, perfumes, and other cosmetic products.  Avoid anything that causes your rash. Keep a journal to help  track what causes your rash. Write down: ? What you eat. ? What cosmetic products you use. ? What you drink. ? What you wear. This includes jewelry.  Keep all follow-up visits as told by your doctor. This is important.   Contact a doctor if:  You sweat at night.  You lose weight.  You pee (urinate) more than normal.  You pee less than normal, or you notice that your pee is a darker color than normal.  You feel weak.  You throw up (vomit).  Your skin or the whites of your eyes look yellow (jaundice).  Your skin: ? Tingles. ? Is numb.  Your rash: ? Does not go away after a few days. ? Gets worse.  You are: ? More thirsty than normal. ? More tired than normal.  You have: ? New symptoms. ? Pain in your belly (abdomen). ? A fever. ? Watery poop (diarrhea). Get help right away if:  You have a fever and your symptoms suddenly get worse.  You start to feel mixed up (confused).  You have a very bad headache or a stiff neck.  You have very bad joint pains or stiffness.  You have jerky movements that you cannot control (seizure).  Your rash covers all or most of your body. The rash may or may not be painful.  You have blisters that: ? Are on top of the rash. ? Grow larger. ? Grow together. ? Are painful. ? Are inside your nose or mouth.  You have   a rash that: ? Looks like purple pinprick-sized spots all over your body. ? Has a "bull's eye" or looks like a target. ? Is red and painful, causes your skin to peel, and is not from being in the sun too long. Summary  A rash is a change in the color of your skin. A rash can also change the way your skin feels.  The goal of treatment is to stop the itching and keep the rash from spreading.  Take or apply over-the-counter and prescription medicines only as told by your doctor.  Contact a doctor if you have new symptoms or symptoms that get worse.  Keep all follow-up visits as told by your doctor. This is  important. This information is not intended to replace advice given to you by your health care provider. Make sure you discuss any questions you have with your health care provider. Document Revised: 11/14/2018 Document Reviewed: 02/24/2018 Elsevier Patient Education  2021 Elsevier Inc.  

## 2020-09-12 NOTE — Progress Notes (Signed)
   [facility], [place]  [address] Phone:  [phone]   Fax:  [phone]  Virtual Visit via Video Note  I connected with [name] on 09/12/20 at 10:00 AM EST by video and verified that I am speaking with the correct person using two identifiers.   I discussed the limitations, risks, security and privacy concerns of performing an evaluation and management service by telephone and the availability of in person appointments. I also discussed with the patient that there may be a patient responsible charge related to this service. The patient expressed understanding and agreed to proceed.  Patient home Provider Office  History of Present Illness:  Patient presents for evaluation of a rash involving the scalp. Rash started 4 days ago. Lesions are red and skin color, and raised in texture. Rash has changed over time. Rash is pruritic. Associated symptoms: none. She was daignosed with COVID last week and she thought this may have been related however unsure. Patient denies: abdominal pain, arthralgia, congestion, cough, headache, myalgia, nausea and vomiting. Patient has not had contacts with similar rash. Patient has not had new exposures (soaps, lotions, laundry detergents, foods, medications, plants, insects or animals). Benadryl with little benefit.  Observations/Objective: Virtual visit no exam  Redness and inflammation to medial aspect of left arm and on her hand (Video not very clear)  Assessment and Plan: Assessment  Primary Diagnosis & Pertinent Problem List: The encounter diagnosis was Rash in adult.  Visit Diagnosis: 1. Rash in adult  Trial Medrol Dosepak Atarax 10 mg TID prn for pruritis  If not effective will treat for possible scabies due to the distrubution of the raise and it woke her up one night    Follow Up Instructions: To be scheduled   I discussed the assessment and treatment plan with the patient. The  patient was provided an opportunity to ask questions and all were answered. The patient agreed with the plan and demonstrated an understanding of the instructions.   The patient was advised to call back or seek an in-person evaluation if the symptoms worsen or if the condition fails to improve as anticipated.  I provided 11 minutes of video- face-to-face time during this encounter.   Vevelyn Francois, NP

## 2020-09-26 ENCOUNTER — Other Ambulatory Visit: Payer: Self-pay | Admitting: Nurse Practitioner

## 2020-09-26 DIAGNOSIS — F411 Generalized anxiety disorder: Secondary | ICD-10-CM

## 2020-09-26 MED FILL — ?ATORVASTATIN 20 MG TABLET: 20 | 30 days supply | Qty: 30 | Fill #1

## 2020-09-26 NOTE — Telephone Encounter (Signed)
Please see refill request.

## 2020-09-28 ENCOUNTER — Other Ambulatory Visit: Payer: Self-pay | Admitting: Nurse Practitioner

## 2020-09-28 MED FILL — ?SERTRALINE HCL 50 MG TABS: 50 | 30 days supply | Qty: 30 | Fill #0

## 2020-10-28 MED FILL — ?ATORVASTATIN 20 MG TABLET: 20 | 30 days supply | Qty: 30 | Fill #2

## 2020-11-21 MED FILL — Atorvastatin Calcium Tab 20 MG (Base Equivalent): ORAL | 30 days supply | Qty: 30 | Fill #0 | Status: AC

## 2020-11-22 ENCOUNTER — Other Ambulatory Visit: Payer: Self-pay

## 2020-11-28 ENCOUNTER — Other Ambulatory Visit: Payer: Self-pay

## 2020-12-25 MED FILL — Atorvastatin Calcium Tab 20 MG (Base Equivalent): ORAL | 30 days supply | Qty: 30 | Fill #1 | Status: AC

## 2020-12-26 ENCOUNTER — Other Ambulatory Visit: Payer: Self-pay

## 2020-12-27 ENCOUNTER — Other Ambulatory Visit: Payer: Self-pay

## 2021-01-24 MED FILL — Atorvastatin Calcium Tab 20 MG (Base Equivalent): ORAL | 30 days supply | Qty: 30 | Fill #2 | Status: AC

## 2021-01-25 ENCOUNTER — Other Ambulatory Visit: Payer: Self-pay

## 2021-02-20 MED FILL — Atorvastatin Calcium Tab 20 MG (Base Equivalent): ORAL | 30 days supply | Qty: 30 | Fill #3 | Status: AC

## 2021-02-21 ENCOUNTER — Other Ambulatory Visit: Payer: Self-pay

## 2021-02-24 ENCOUNTER — Other Ambulatory Visit: Payer: Self-pay

## 2021-04-05 ENCOUNTER — Other Ambulatory Visit: Payer: Self-pay

## 2021-04-05 ENCOUNTER — Telehealth: Payer: Self-pay

## 2021-04-05 DIAGNOSIS — I7409 Other arterial embolism and thrombosis of abdominal aorta: Secondary | ICD-10-CM

## 2021-04-05 MED ORDER — ATORVASTATIN CALCIUM 20 MG PO TABS
ORAL_TABLET | Freq: Every day | ORAL | 6 refills | Status: DC
  Filled 2021-04-05: qty 30, 30d supply, fill #0
  Filled 2021-05-02: qty 30, 30d supply, fill #1
  Filled 2021-06-06: qty 30, 30d supply, fill #2
  Filled 2021-07-03: qty 30, 30d supply, fill #3
  Filled 2021-08-04: qty 30, 30d supply, fill #4
  Filled 2021-09-04: qty 30, 30d supply, fill #5
  Filled 2021-09-04: qty 30, 30d supply, fill #0
  Filled 2021-10-01: qty 30, 30d supply, fill #1

## 2021-04-05 NOTE — Telephone Encounter (Signed)
Rx refilled.

## 2021-04-05 NOTE — Telephone Encounter (Signed)
Cholesterol refill

## 2021-04-06 ENCOUNTER — Other Ambulatory Visit: Payer: Self-pay

## 2021-04-18 ENCOUNTER — Other Ambulatory Visit: Payer: Self-pay

## 2021-04-18 ENCOUNTER — Ambulatory Visit: Payer: Self-pay | Attending: Nurse Practitioner

## 2021-04-18 ENCOUNTER — Telehealth: Payer: Self-pay

## 2021-04-18 NOTE — Telephone Encounter (Signed)
Patient is scheduled for 9/29 she is wanting to come in before to have lab work done so she knows what medication she needs the day of her visit. Please advise thanks.

## 2021-04-19 ENCOUNTER — Other Ambulatory Visit: Payer: Self-pay | Admitting: Nurse Practitioner

## 2021-04-19 DIAGNOSIS — R7989 Other specified abnormal findings of blood chemistry: Secondary | ICD-10-CM

## 2021-04-19 DIAGNOSIS — E782 Mixed hyperlipidemia: Secondary | ICD-10-CM

## 2021-04-19 DIAGNOSIS — E559 Vitamin D deficiency, unspecified: Secondary | ICD-10-CM

## 2021-04-19 NOTE — Telephone Encounter (Signed)
Lab appointment scheduled 

## 2021-04-19 NOTE — Progress Notes (Signed)
   [facility] [address] , [place]  [address] Phone:  [phone]   Fax:  [phone] 

## 2021-04-20 ENCOUNTER — Other Ambulatory Visit: Payer: Self-pay

## 2021-04-20 ENCOUNTER — Other Ambulatory Visit: Payer: Self-pay | Admitting: Nurse Practitioner

## 2021-04-20 DIAGNOSIS — E559 Vitamin D deficiency, unspecified: Secondary | ICD-10-CM

## 2021-04-20 DIAGNOSIS — E782 Mixed hyperlipidemia: Secondary | ICD-10-CM

## 2021-04-20 DIAGNOSIS — R7989 Other specified abnormal findings of blood chemistry: Secondary | ICD-10-CM

## 2021-04-21 LAB — LIPID PANEL
Chol/HDL Ratio: 2.6 ratio (ref 0.0–4.4)
Cholesterol, Total: 116 mg/dL (ref 100–199)
HDL: 44 mg/dL (ref >39)
LDL Chol Calc (NIH): 57 mg/dL (ref 0–99)
Triglycerides: 71 mg/dL (ref 0–149)
VLDL Cholesterol Cal: 15 mg/dL (ref 5–40)

## 2021-04-21 LAB — VITAMIN D 25 HYDROXY (VIT D DEFICIENCY, FRACTURES): Vit D, 25-Hydroxy: 56.5 ng/mL (ref 30.0–100.0)

## 2021-04-21 LAB — COMP. METABOLIC PANEL (12)
AST: 25 IU/L (ref 0–40)
Albumin/Globulin Ratio: 2.1 (ref 1.2–2.2)
Albumin: 4.4 g/dL (ref 3.8–4.9)
Alkaline Phosphatase: 89 IU/L (ref 44–121)
BUN/Creatinine Ratio: 14 (ref 9–23)
BUN: 10 mg/dL (ref 6–24)
Bilirubin Total: 0.3 mg/dL (ref 0.0–1.2)
Calcium: 9.7 mg/dL (ref 8.7–10.2)
Chloride: 104 mmol/L (ref 96–106)
Creatinine, Ser: 0.7 mg/dL (ref 0.57–1.00)
Globulin, Total: 2.1 g/dL (ref 1.5–4.5)
Glucose: 106 mg/dL — ABNORMAL HIGH (ref 65–99)
Potassium: 4.5 mmol/L (ref 3.5–5.2)
Sodium: 142 mmol/L (ref 134–144)
Total Protein: 6.5 g/dL (ref 6.0–8.5)
eGFR: 103 mL/min/{1.73_m2} (ref >59)

## 2021-04-21 LAB — TSH: TSH: 0.979 u[IU]/mL (ref 0.450–4.500)

## 2021-05-03 ENCOUNTER — Other Ambulatory Visit: Payer: Self-pay

## 2021-05-04 ENCOUNTER — Encounter: Payer: Self-pay | Admitting: Nurse Practitioner

## 2021-05-04 ENCOUNTER — Other Ambulatory Visit: Payer: Self-pay

## 2021-05-10 ENCOUNTER — Other Ambulatory Visit: Payer: Self-pay

## 2021-05-10 DIAGNOSIS — Z1231 Encounter for screening mammogram for malignant neoplasm of breast: Secondary | ICD-10-CM

## 2021-05-15 ENCOUNTER — Encounter: Payer: Self-pay | Admitting: Nurse Practitioner

## 2021-05-15 ENCOUNTER — Other Ambulatory Visit: Payer: Self-pay

## 2021-05-15 ENCOUNTER — Ambulatory Visit (HOSPITAL_COMMUNITY)
Admission: RE | Admit: 2021-05-15 | Discharge: 2021-05-15 | Disposition: A | Discharge status: Home / Self Care | Payer: Self-pay | Source: Ambulatory Visit | Attending: Nurse Practitioner | Admitting: Nurse Practitioner

## 2021-05-15 ENCOUNTER — Ambulatory Visit (INDEPENDENT_AMBULATORY_CARE_PROVIDER_SITE_OTHER): Payer: Self-pay | Admitting: Nurse Practitioner

## 2021-05-15 VITALS — BP 130/69 | HR 91 | Temp 98.2°F | Ht 60.0 in | Wt 125.2 lb

## 2021-05-15 DIAGNOSIS — M549 Dorsalgia, unspecified: Secondary | ICD-10-CM

## 2021-05-15 DIAGNOSIS — G8929 Other chronic pain: Secondary | ICD-10-CM

## 2021-05-15 DIAGNOSIS — R42 Dizziness and giddiness: Secondary | ICD-10-CM

## 2021-05-15 DIAGNOSIS — F411 Generalized anxiety disorder: Secondary | ICD-10-CM

## 2021-05-15 DIAGNOSIS — R1031 Right lower quadrant pain: Secondary | ICD-10-CM | POA: Insufficient documentation

## 2021-05-15 DIAGNOSIS — F17200 Nicotine dependence, unspecified, uncomplicated: Secondary | ICD-10-CM

## 2021-05-15 DIAGNOSIS — E782 Mixed hyperlipidemia: Secondary | ICD-10-CM

## 2021-05-15 LAB — POCT URINALYSIS DIP (CLINITEK)
Bilirubin, UA: NEGATIVE
Glucose, UA: NEGATIVE mg/dL
Ketones, POC UA: NEGATIVE mg/dL
Nitrite, UA: NEGATIVE
POC PROTEIN,UA: NEGATIVE
Spec Grav, UA: 1.01
Urobilinogen, UA: 0.2 U/dL
pH, UA: 5.5

## 2021-05-15 MED ORDER — NAPROXEN 375 MG PO TABS
375.0000 mg | ORAL_TABLET | Freq: Two times a day (BID) | ORAL | 6 refills | Status: AC
  Filled 2021-05-15: qty 30, 15d supply, fill #0
  Filled 2021-09-10: qty 30, 15d supply, fill #1
  Filled 2021-09-11: qty 30, 15d supply, fill #0
  Filled 2022-01-29: qty 30, 15d supply, fill #1
  Filled 2022-02-10: qty 30, 15d supply, fill #2

## 2021-05-15 MED ORDER — MECLIZINE HCL 25 MG PO TABS
25.0000 mg | ORAL_TABLET | Freq: Three times a day (TID) | ORAL | 0 refills | Status: DC | PRN
  Filled 2021-05-15: qty 30, 10d supply, fill #0

## 2021-05-15 MED ORDER — NICOTINE 21 MG/24HR TD PT24
21.0000 mg | MEDICATED_PATCH | Freq: Every day | TRANSDERMAL | 0 refills | Status: DC
  Filled 2021-05-15: qty 28, 28d supply, fill #0

## 2021-05-15 MED ORDER — SERTRALINE HCL 50 MG PO TABS
ORAL_TABLET | Freq: Every day | ORAL | 0 refills | Status: DC
  Filled 2021-05-15: qty 30, 30d supply, fill #0

## 2021-05-15 NOTE — Progress Notes (Signed)
[facility] [place], [place]  [address] Phone:  [phone]   Fax:  [phone]   Established Patient Office Visit  Subjective:  Patient ID: Brandi Molina, female    DOB: Jul 25, 1966  Age: 55 y.o. MRN: [id number]  CC:  Chief Complaint  Patient presents with   Abdominal Pain    Abdominal pain:Right side Pt states that she has been having this pain more than 3 months. Pt states that the pain comes and goes and really is not sure what the cause of it is.    HPI Brandi Molina presents for follow up. She  has a past medical history of Allergy, Anxiety, Bartholin gland cyst (2008), COVID (09/12/2020), Depression, and Hyperlipidemia.   Abdominal Pain Patient complains of abdominal pain. The pain is described as dull, and is 2/10 in intensity. The patient is experiencing RUQ pain with radiation to right back. Onset was 3 month ago. Symptoms have been unchanged. Aggravating factors: none.  Alleviating factors: acetaminophen, NSAIDs, and heating pad. Associated symptoms: diarrhea. The patient denies anorexia, belching, chills, constipation, fever, frequency, hematochezia, hematuria, melena, nausea, and vomiting.  She has bartholin cyst. She has increased drainage. This is worse after sexual intercourse.  She will follow up with GYN. She has called them with no additional treatment options.  She reports that her anxiety is worse with highway driving. She feels like she is worse as a passenger.  She continues with her sertraline 50 mg which is effective.  She agrees that she needs to discontinue smoking Past Medical History:  Diagnosis Date   Allergy    Anxiety    Bartholin gland cyst 2008   COVID 09/12/2020   Depression    Hyperlipidemia     Past Surgical History:  Procedure Laterality Date   BREAST BIOPSY Left 2020   CHOLECYSTECTOMY  1999    Family History  Problem Relation Age of Onset   Diabetes Mother    Heart disease Mother     Heart attack Mother    AAA (abdominal aortic aneurysm) Mother    COPD Mother    Cancer Father        lung   Hypertension Brother    Cancer - Other Brother        pallate cancer   Diabetes Brother    Anesthesia problems Neg Hx    Hypotension Neg Hx    Malignant hyperthermia Neg Hx    Pseudochol deficiency Neg Hx    Colon cancer Neg Hx    Stomach cancer Neg Hx    Rectal cancer Neg Hx    Esophageal cancer Neg Hx    Breast cancer Neg Hx     Social History   Socioeconomic History   Marital status: Widowed    Spouse name: Not on file   Number of children: 1   Years of education: Not on file   Highest education level: 12th grade  Occupational History   Not on file  Tobacco Use   Smoking status: Every Day    Packs/day: 1.00    Years: 20.00    Pack years: 20.00    Types: Cigarettes   Smokeless tobacco: Never  Vaping Use   Vaping Use: Never used  Substance and Sexual Activity   Alcohol use: Yes    Comment: occ   Drug use: No   Sexual activity: Yes  Other Topics Concern   Not on file  Social History Narrative   Not  on file   Social Determinants of Health   Financial Resource Strain: Not on file  Food Insecurity: Not on file  Transportation Needs: Not on file  Physical Activity: Not on file  Stress: Not on file  Social Connections: Not on file  Intimate Partner Violence: Not on file    Outpatient Medications Prior to Visit  Medication Sig Dispense Refill   acetaminophen (TYLENOL) 500 MG tablet Take 1 tablet (500 mg total) by mouth every 6 (six) hours as needed. 30 tablet 0   atorvastatin (LIPITOR) 20 MG tablet TAKE 1 TABLET (20 MG TOTAL) BY MOUTH DAILY. 30 tablet 6   Cholecalciferol (VITAMIN D) 50 MCG (2000 UT) CAPS Take 1 capsule (2,000 Units total) by mouth daily. 30 capsule 6   fluticasone (FLONASE) 50 MCG/ACT nasal spray Place 2 sprays into both nostrils daily. 16 g 6   lidocaine (XYLOCAINE) 2 % solution Apply to affected area as needed for pain 30 mL 1    Multiple Vitamin (MULTIVITAMIN) tablet Take 1 tablet by mouth daily.     nystatin cream (MYCOSTATIN) Apply 1 application topically 2 (two) times daily. 30 g 3   meclizine (ANTIVERT) 25 MG tablet Take 1 tablet (25 mg total) by mouth 3 (three) times daily as needed for dizziness. 30 tablet 0   naproxen (NAPROSYN) 375 MG tablet Take 1 tablet (375 mg total) by mouth 2 (two) times daily. 30 tablet 6   sertraline (ZOLOFT) 50 MG tablet TAKE 1 TABLET (50 MG TOTAL) BY MOUTH DAILY. 30 tablet 0   aspirin 81 MG tablet Take 81 mg by mouth daily. (Patient not taking: Reported on 05/15/2021)     cetirizine (ZYRTEC) 10 MG tablet Take 1 tablet (10 mg total) by mouth daily. (Patient not taking: Reported on 05/15/2021) 30 tablet 11   hydrOXYzine (ATARAX/VISTARIL) 10 MG tablet TAKE 1 TABLET (10 MG TOTAL) BY MOUTH 3 (THREE) TIMES DAILY AS NEEDED FOR UP TO 10 DAYS FOR ITCHING. (Patient not taking: Reported on 05/15/2021) 30 tablet 0   methylPREDNISolone (MEDROL DOSEPAK) 4 MG TBPK tablet FOLLOW PACKAGE INSTRUCTIONS. (Patient not taking: Reported on 05/15/2021) 21 each 0   Facility-Administered Medications Prior to Visit  Medication Dose Route Frequency Provider Last Rate Last Admin   lidocaine (XYLOCAINE) 0.5 % (with pres) injection 5 mL  5 mL Intradermal Once Dorena Dew, FNP        No Known Allergies  ROS Review of Systems    Objective:    Physical Exam Constitutional:      General: She is not in acute distress.    Appearance: She is not toxic-appearing.  Cardiovascular:     Heart sounds: Normal heart sounds.  Pulmonary:     Effort: Pulmonary effort is normal.     Comments: diminshed Abdominal:     General: Abdomen is flat. Bowel sounds are normal.     Palpations: Abdomen is soft.     Tenderness: There is abdominal tenderness in the right upper quadrant. There is no right CVA tenderness or left CVA tenderness.  Skin:    General: Skin is warm and dry.     Capillary Refill: Capillary refill  takes less than 2 seconds.  Neurological:     General: No focal deficit present.     Mental Status: She is alert and oriented to person, place, and time.  Psychiatric:        Mood and Affect: Mood normal.    BP 130/69   Pulse 91   Temp  98.2 F (36.8 C)   Ht 5' (1.524 m)   Wt 125 lb 3.2 oz (56.8 kg)   LMP 09/20/2013 (LMP Unknown)   SpO2 100%   BMI 24.45 kg/m  Wt Readings from Last 3 Encounters:  05/15/21 125 lb 3.2 oz (56.8 kg)  09/12/20 120 lb (54.4 kg)  01/28/20 122 lb (55.3 kg)   h  Health Maintenance Due  Topic Date Due   PAP SMEAR-Modifier  06/30/2021    There are no preventive care reminders to display for this patient.  Lab Results  Component Value Date   TSH 0.979 04/20/2021   Lab Results  Component Value Date   WBC 15.1 (H) 06/08/2019   HGB 15.2 06/08/2019   HCT 43.1 06/08/2019   MCV 92 06/08/2019   PLT 378 06/08/2019   Lab Results  Component Value Date   NA 142 04/20/2021   K 4.5 04/20/2021   CO2 17 (L) 06/08/2019   GLUCOSE 106 (H) 04/20/2021   BUN 10 04/20/2021   CREATININE 0.70 04/20/2021   BILITOT 0.3 04/20/2021   ALKPHOS 89 04/20/2021   AST 25 04/20/2021   ALT 17 06/08/2019   PROT 6.5 04/20/2021   ALBUMIN 4.4 04/20/2021   CALCIUM 9.7 04/20/2021   ANIONGAP 10 08/27/2015   EGFR 103 04/20/2021   Lab Results  Component Value Date   CHOL 116 04/20/2021   Lab Results  Component Value Date   HDL 44 04/20/2021   Lab Results  Component Value Date   LDLCALC 57 04/20/2021   Lab Results  Component Value Date   TRIG 71 04/20/2021   Lab Results  Component Value Date   CHOLHDL 2.6 04/20/2021   Lab Results  Component Value Date   HGBA1C 5.4 11/29/2017      Assessment & Plan:   Problem List Items Addressed This Visit       Other   Nicotine dependence Discussed the risk factors associated with smoking ; CAD, COPD, Cancer, PVD increased susceptibility to respiratory illnesses Discussed treatment options with cessation ie  counseling, support resources and available medications  Choosing a quit day and setting goals accordingly. Discussed ways to quit; start by decreasing one cigarette per day or per week.  Started on Nicoderm patch  Encourage patient to call for assistance once ready to quit. Provided education handouts   Counseling 5-10 minutes    Relevant Medications   nicotine (NICODERM CQ) 21 mg/24hr patch   Generalized anxiety disorder Worsening due to unknown cause of persistent abdominal pain Declined any change in treatment at this time   Relevant Medications   sertraline (ZOLOFT) 50 MG tablet   Chronic back pain Persistent Reevaluation of lumbar spine   Relevant Medications   naproxen (NAPROSYN) 375 MG tablet   sertraline (ZOLOFT) 50 MG tablet   Other Relevant Orders   DG Lumbar Spine Complete   Other Visit Diagnoses     Right lower quadrant abdominal pain    -  Primary Persistent Further evaluation with images and labs    Relevant Orders   POCT URINALYSIS DIP (CLINITEK) (Completed)   DG Abd 2 Views   Urine Culture   Amylase   Lipase   CBC with Differential/Platelet   Comp. Metabolic Panel (12)   Vertigo     Stable   Relevant Medications   meclizine (ANTIVERT) 25 MG tablet   Mixed hyperlipidemia   Stable Continue with current regimen.  No changes warranted. Good patient compliance.    Relevant Orders  Lipid panel       Meds ordered this encounter  Medications   meclizine (ANTIVERT) 25 MG tablet    Sig: Take 1 tablet (25 mg total) by mouth 3 (three) times daily as needed for dizziness.    Dispense:  30 tablet    Refill:  0   naproxen (NAPROSYN) 375 MG tablet    Sig: Take 1 tablet (375 mg total) by mouth 2 (two) times daily.    Dispense:  30 tablet    Refill:  6   sertraline (ZOLOFT) 50 MG tablet    Sig: TAKE 1 TABLET (50 MG TOTAL) BY MOUTH DAILY.    Dispense:  30 tablet    Refill:  0   nicotine (NICODERM CQ) 21 mg/24hr patch    Sig: Place 1 patch (21 mg total)  onto the skin daily.    Dispense:  28 patch    Refill:  0    Order Specific Question:   Supervising Provider    Answer:   Tresa Garter [id number]    Follow-up: Return in about 3 months (around 08/15/2021) for HLD 99213.    Vevelyn Francois, NP

## 2021-05-15 NOTE — Patient Instructions (Addendum)
Healthy Eating to Prevent Digestive Disorders The digestive system starts at the mouth and goes all the way down to the rectum. Along the way, your digestive system breaks down the food you eat so you can absorb its nutrients and use them for energy. Digestive disorders can cause gas, bloating, pain, heartburn, and other symptoms. They can prevent your digestive system from doing its job. Healthy eating and a healthy lifestyle can help you avoid many common digestive disorders. How can making healthy changes affect me? Making changes will help your digestive system function at its best. A healthy digestive system can help you avoid or improve your management of digestive disorders such as: Heartburn, bloating, gas, and flatulence. Gastroesophageal reflux disease (GERD). Irritable bowel syndrome. Constipation or diarrhea. Diverticulitis. Hemorrhoids. Gallstones. Fatty liver disease. Peptic ulcer disease. Malnutrition. Making changes can also lower your risk for other conditions caused by a poor diet or an unhealthy weight, such as heart disease, stroke, and diabetes. What nutrition changes can I make? Start by eating a balanced diet. Eat healthy foods from all the major food groups. These include carbohydrates, fats, and proteins. Other changes you can make include taking these steps: Eat enough fiber. Fiber is a healthy carbohydrate that cleans out your digestive system. Fiber absorbs water and helps you have regular bowel movements. Fiber comes from plants. To get enough fiber in your diet, eat 4-5 servings of fruits, vegetables, and legumes every day. Include beans and whole grains. Most people should get 20?35 grams of fiber each day. Drink enough water to keep your urine pale yellow. Water helps your body digest food. It can also help prevent constipation. Avoid fatty proteins. Full-fat dairy products and fatty meats are hard to digest. Fats you want to avoid are those that get solid at room  temperature (saturated fats). Instead of eating these kinds of fats, eat plant-based unsaturated fats found in olives, canola, corn, avocado, and nuts. If you have trouble with gas, belching, or flatulence, avoid gas-producing foods. These include beans, carbonated beverages, cabbage, cauliflower, and broccoli. If you are lactose intolerant, avoid dairy products or choose lactose-free dairy products. If you have frequent heartburn, stay away from alcohol, caffeine, fatty foods, acidic foods, chocolate, and peppermint. Avoid lying down within 2 hours of eating a full meal. Overeating and lying down too soon after a meal can cause heartburn. Consider adding probiotics to your diet. Healthy digestion depends on having the right balance of good bacteria in your colon. Probiotics can help restore the balance of good bacteria in your digestive system. Probiotics are live active cultures that are found in yogurt, kefir, and cultured foods like sauerkraut and miso. You can also add good bacteria with probiotic supplements. Talk with your health care provider to see if probiotics are a good option for you. Make sure to chew your food slowly and completely. Instead of eating three large meals each day, try to eat three small meals along with three small snacks. What other changes can I make? You can also help your digestive system stay healthy by making these lifestyle changes: Stay active and exercise every day. Maintain a healthy weight. Eat on a regular schedule. Avoid tight-fitting clothes. They can restrict digestion. If you have frequent heartburn, raise the head of your bed 2-3 inches (5-7.5 cm). Do not use any products that contain nicotine or tobacco, such as cigarettes, e-cigarettes, and chewing tobacco. If you need help quitting, ask your health care provider. If you drink alcohol: Limit how much  you use to: 0-1 drink a day for women who are not pregnant. 0-2 drinks a day for men. Be aware of how  much alcohol is in your drink. In the [place] one drink equals one 12 oz bottle of beer (355 mL), one 5 oz glass of wine (148 mL), or one 1 oz glass of hard liquor (44 mL). Avoid stress. Find ways to reduce stress, such as meditation, exercise, or taking time for activities that relax you. Where to find more information Learn more about healthy eating and digestive disorders by visiting these websites: Academy of Nutrition and Dietetics: www.eatright.org [place] Department of Health and Human Services: [place] Summary A healthy diet can help prevent many digestive disorders. Eat a balanced diet that includes fiber, unsaturated fats, lean protein, fruits, and vegetables. Drink plenty of water every day. Get plenty of exercise and maintain a healthy weight. This information is not intended to replace advice given to you by your health care provider. Make sure you discuss any questions you have with your health care provider. Document Revised: 07/10/2019 Document Reviewed: 07/10/2019 Elsevier Patient Education  2022 [place], Adult After being diagnosed with an anxiety disorder, you may be relieved to know why you have felt or behaved a certain way. You may also feel overwhelmed about the treatment ahead and what it will mean for your life. With care and support, you can manage this condition and recover from it. How to manage lifestyle changes Managing stress and anxiety Stress is your body's reaction to life changes and events, both good and bad. Most stress will last just a few hours, but stress can be ongoing and can lead to more than just stress. Although stress can play a major role in anxiety, it is not the same as anxiety. Stress is usually caused by something external, such as a deadline, test, or competition. Stress normally passes after the triggering event has ended.  Anxiety is caused by something internal, such as imagining a terrible outcome or worrying that  something will go wrong that will devastate you. Anxiety often does not go away even after the triggering event is over, and it can become long-term (chronic) worry. It is important to understand the differences between stress and anxiety and to manage your stress effectively so that it does not lead to an anxious response. Talk with your health care provider or a counselor to learn more about reducing anxiety and stress. He or she may suggest tension reduction techniques, such as: Music therapy. This can include creating or listening to music that you enjoy and that inspires you. Mindfulness-based meditation. This involves being aware of your normal breaths while not trying to control your breathing. It can be done while sitting or walking. Centering prayer. This involves focusing on a word, phrase, or sacred image that means something to you and brings you peace. Deep breathing. To do this, expand your stomach and inhale slowly through your nose. Hold your breath for 3-5 seconds. Then exhale slowly, letting your stomach muscles relax. Self-talk. This involves identifying thought patterns that lead to anxiety reactions and changing those patterns. Muscle relaxation. This involves tensing muscles and then relaxing them. Choose a tension reduction technique that suits your lifestyle and personality. These techniques take time and practice. Set aside 5-15 minutes a day to do them. Therapists can offer counseling and training in these techniques. The training to help with anxiety may be covered by some insurance plans. Other things you can do  to manage stress and anxiety include: Keeping a stress/anxiety diary. This can help you learn what triggers your reaction and then learn ways to manage your response. Thinking about how you react to certain situations. You may not be able to control everything, but you can control your response. Making time for activities that help you relax and not feeling guilty about  spending your time in this way. Visual imagery and yoga can help you stay calm and relax.  Medicines Medicines can help ease symptoms. Medicines for anxiety include: Anti-anxiety drugs. Antidepressants. Medicines are often used as a primary treatment for anxiety disorder. Medicines will be prescribed by a health care provider. When used together, medicines, psychotherapy, and tension reduction techniques may be the most effective treatment. Relationships Relationships can play a big part in helping you recover. Try to spend more time connecting with trusted friends and family members. Consider going to couples counseling, taking family education classes, or going to family therapy. Therapy can help you and others better understand your condition. How to recognize changes in your anxiety Everyone responds differently to treatment for anxiety. Recovery from anxiety happens when symptoms decrease and stop interfering with your daily activities at home or work. This may mean that you will start to: Have better concentration and focus. Worry will interfere less in your daily thinking. Sleep better. Be less irritable. Have more energy. Have improved memory. It is important to recognize when your condition is getting worse. Contact your health care provider if your symptoms interfere with home or work and you feel like your condition is not improving. Follow these instructions at home: Activity Exercise. Most adults should do the following: Exercise for at least 150 minutes each week. The exercise should increase your heart rate and make you sweat (moderate-intensity exercise). Strengthening exercises at least twice a week. Get the right amount and quality of sleep. Most adults need 7-9 hours of sleep each night. Lifestyle  Eat a healthy diet that includes plenty of vegetables, fruits, whole grains, low-fat dairy products, and lean protein. Do not eat a lot of foods that are high in solid fats,  added sugars, or salt. Make choices that simplify your life. Do not use any products that contain nicotine or tobacco, such as cigarettes, e-cigarettes, and chewing tobacco. If you need help quitting, ask your health care provider. Avoid caffeine, alcohol, and certain over-the-counter cold medicines. These may make you feel worse. Ask your pharmacist which medicines to avoid. General instructions Take over-the-counter and prescription medicines only as told by your health care provider. Keep all follow-up visits as told by your health care provider. This is important. Where to find support You can get help and support from these sources: Self-help groups. Online and [employer]. A trusted spiritual leader. Couples counseling. Family education classes. Family therapy. Where to find more information You may find that joining a support group helps you deal with your anxiety. The following sources can help you locate counselors or support groups near you: [place]: www.mentalhealthamerica.net Anxiety and Depression Association of [place] (ADAA): https://www.clark.net/ National Alliance on Mental Illness (NAMI): www.nami.org Contact a health care provider if you: Have a hard time staying focused or finishing daily tasks. Spend many hours a day feeling worried about everyday life. Become exhausted by worry. Start to have headaches, feel tense, or have nausea. Urinate more than normal. Have diarrhea. Get help right away if you have: A racing heart and shortness of breath. Thoughts of hurting yourself or others. If  you ever feel like you may hurt yourself or others, or have thoughts about taking your own life, get help right away. You can go to your nearest emergency department or call: Your local emergency services (911 in the [place] A suicide crisis helpline, such as the [facility] at 870-043-5917. This is open 24 hours a day. Summary Taking  steps to learn and use tension reduction techniques can help calm you and help prevent triggering an anxiety reaction. When used together, medicines, psychotherapy, and tension reduction techniques may be the most effective treatment. Family, friends, and partners can play a big part in helping you recover from an anxiety disorder. This information is not intended to replace advice given to you by your health care provider. Make sure you discuss any questions you have with your health care provider. Document Revised: 12/23/2018 Document Reviewed: 12/23/2018 Elsevier Patient Education  [place].  Steps to Quit Smoking Smoking tobacco is the leading cause of preventable death. It can affect almost every organ in the body. Smoking puts you and people around you at risk for many serious, long-lasting (chronic) diseases. Quitting smoking can be hard, but it is one of the best things that you can do for your health. It is never too late to quit. How do I get ready to quit? When you decide to quit smoking, make a plan to help you succeed. Before you quit: Pick a date to quit. Set a date within the next 2 weeks to give you time to prepare. Write down the reasons why you are quitting. Keep this list in places where you will see it often. Tell your family, friends, and co-workers that you are quitting. Their support is important. Talk with your doctor about the choices that may help you quit. Find out if your health insurance will pay for these treatments. Know the people, places, things, and activities that make you want to smoke (triggers). Avoid them. What first steps can I take to quit smoking? Throw away all cigarettes at home, at work, and in your car. Throw away the things that you use when you smoke, such as ashtrays and lighters. Clean your car. Make sure to empty the ashtray. Clean your home, including curtains and carpets. What can I do to help me quit smoking? Talk with your doctor  about taking medicines and seeing a counselor at the same time. You are more likely to succeed when you do both. If you are pregnant or breastfeeding, talk with your doctor about counseling or other ways to quit smoking. Do not take medicine to help you quit smoking unless your doctor tells you to do so. To quit smoking: Quit right away Quit smoking totally, instead of slowly cutting back on how much you smoke over a period of time. Go to counseling. You are more likely to quit if you go to counseling sessions regularly. Take medicine You may take medicines to help you quit. Some medicines need a prescription, and some you can buy over-the-counter. Some medicines may contain a drug called nicotine to replace the nicotine in cigarettes. Medicines may: Help you to stop having the desire to smoke (cravings). Help to stop the problems that come when you stop smoking (withdrawal symptoms). Your doctor may ask you to use: Nicotine patches, gum, or lozenges. Nicotine inhalers or sprays. Non-nicotine medicine that is taken by mouth. Find resources Find resources and other ways to help you quit smoking and remain smoke-free after you quit. These resources  are most helpful when you use them often. They include: Online chats with a Social worker. Phone quitlines. Printed Furniture conservator/restorer. Support groups or group counseling. Text messaging programs. Mobile phone apps. Use apps on your mobile phone or tablet that can help you stick to your quit plan. There are many free apps for mobile phones and tablets as well as websites. Examples include Quit Guide from the [employer] and smokefree.gov  What things can I do to make it easier to quit?  Talk to your family and friends. Ask them to support and encourage you. Call a phone quitline (1-800-QUIT-NOW), reach out to support groups, or work with a Social worker. Ask people who smoke to not smoke around you. Avoid places that make you want to smoke, such  as: Bars. Parties. Smoke-break areas at work. Spend time with people who do not smoke. Lower the stress in your life. Stress can make you want to smoke. Try these things to help your stress: Getting regular exercise. Doing deep-breathing exercises. Doing yoga. Meditating. Doing a body scan. To do this, close your eyes, focus on one area of your body at a time from head to toe. Notice which parts of your body are tense. Try to relax the muscles in those areas. How will I feel when I quit smoking? Day 1 to 3 weeks Within the first 24 hours, you may start to have some problems that come from quitting tobacco. These problems are very bad 2-3 days after you quit, but they do not often last for more than 2-3 weeks. You may get these symptoms: Mood swings. Feeling restless, nervous, angry, or annoyed. Trouble concentrating. Dizziness. Strong desire for high-sugar foods and nicotine. Weight gain. Trouble pooping (constipation). Feeling like you may vomit (nausea). Coughing or a sore throat. Changes in how the medicines that you take for other issues work in your body. Depression. Trouble sleeping (insomnia). Week 3 and afterward After the first 2-3 weeks of quitting, you may start to notice more positive results, such as: Better sense of smell and taste. Less coughing and sore throat. Slower heart rate. Lower blood pressure. Clearer skin. Better breathing. Fewer sick days. Quitting smoking can be hard. Do not give up if you fail the first time. Some people need to try a few times before they succeed. Do your best to stick to your quit plan, and talk with your doctor if you have any questions or concerns. Summary Smoking tobacco is the leading cause of preventable death. Quitting smoking can be hard, but it is one of the best things that you can do for your health. When you decide to quit smoking, make a plan to help you succeed. Quit smoking right away, not slowly over a period of  time. When you start quitting, seek help from your doctor, family, or friends. This information is not intended to replace advice given to you by your health care provider. Make sure you discuss any questions you have with your health care provider. Document Revised: 04/17/2019 Document Reviewed: 10/11/2018 Elsevier Patient Education  [place].

## 2021-05-16 LAB — CBC WITH DIFFERENTIAL/PLATELET
Basophils Absolute: 0 10*3/uL (ref 0.0–0.2)
Basos: 0 %
EOS (ABSOLUTE): 0.2 10*3/uL (ref 0.0–0.4)
Eos: 2 %
Hematocrit: 44.6 % (ref 34.0–46.6)
Hemoglobin: 15.6 g/dL (ref 11.1–15.9)
Immature Grans (Abs): 0 10*3/uL (ref 0.0–0.1)
Immature Granulocytes: 0 %
Lymphocytes Absolute: 2 10*3/uL (ref 0.7–3.1)
Lymphs: 22 %
MCH: 32.4 pg (ref 26.6–33.0)
MCHC: 35 g/dL (ref 31.5–35.7)
MCV: 93 fL (ref 79–97)
Monocytes Absolute: 0.6 10*3/uL (ref 0.1–0.9)
Monocytes: 6 %
Neutrophils Absolute: 6.6 10*3/uL (ref 1.4–7.0)
Neutrophils: 70 %
Platelets: 322 10*3/uL (ref 150–450)
RBC: 4.82 x10E6/uL (ref 3.77–5.28)
RDW: 11.7 % (ref 11.7–15.4)
WBC: 9.4 10*3/uL (ref 3.4–10.8)

## 2021-05-16 LAB — COMP. METABOLIC PANEL (12)
AST: 23 IU/L (ref 0–40)
Albumin/Globulin Ratio: 1.8 (ref 1.2–2.2)
Albumin: 4.7 g/dL (ref 3.8–4.9)
Alkaline Phosphatase: 97 IU/L (ref 44–121)
BUN/Creatinine Ratio: 13 (ref 9–23)
BUN: 9 mg/dL (ref 6–24)
Bilirubin Total: 0.3 mg/dL (ref 0.0–1.2)
Calcium: 9.7 mg/dL (ref 8.7–10.2)
Chloride: 106 mmol/L (ref 96–106)
Creatinine, Ser: 0.72 mg/dL (ref 0.57–1.00)
Globulin, Total: 2.6 g/dL (ref 1.5–4.5)
Glucose: 94 mg/dL (ref 70–99)
Potassium: 4.2 mmol/L (ref 3.5–5.2)
Sodium: 142 mmol/L (ref 134–144)
Total Protein: 7.3 g/dL (ref 6.0–8.5)
eGFR: 99 mL/min/{1.73_m2} (ref >59)

## 2021-05-16 LAB — LIPID PANEL
Chol/HDL Ratio: 3.1 ratio (ref 0.0–4.4)
Cholesterol, Total: 116 mg/dL (ref 100–199)
HDL: 38 mg/dL — ABNORMAL LOW (ref >39)
LDL Chol Calc (NIH): 52 mg/dL (ref 0–99)
Triglycerides: 153 mg/dL — ABNORMAL HIGH (ref 0–149)
VLDL Cholesterol Cal: 26 mg/dL (ref 5–40)

## 2021-05-16 LAB — AMYLASE: Amylase: 84 U/L (ref 31–110)

## 2021-05-16 LAB — LIPASE: Lipase: 34 U/L (ref 14–72)

## 2021-05-18 ENCOUNTER — Other Ambulatory Visit: Payer: Self-pay

## 2021-05-18 ENCOUNTER — Other Ambulatory Visit: Payer: Self-pay | Admitting: Nurse Practitioner

## 2021-05-18 DIAGNOSIS — B9620 Unspecified Escherichia coli [E. coli] as the cause of diseases classified elsewhere: Secondary | ICD-10-CM

## 2021-05-18 DIAGNOSIS — N390 Urinary tract infection, site not specified: Secondary | ICD-10-CM

## 2021-05-18 LAB — URINE CULTURE

## 2021-05-18 MED ORDER — NITROFURANTOIN MONOHYD MACRO 100 MG PO CAPS
100.0000 mg | ORAL_CAPSULE | Freq: Two times a day (BID) | ORAL | 0 refills | Status: AC
  Filled 2021-05-18: qty 10, 5d supply, fill #0

## 2021-05-19 ENCOUNTER — Other Ambulatory Visit: Payer: Self-pay

## 2021-05-24 ENCOUNTER — Encounter: Payer: Self-pay | Admitting: Nurse Practitioner

## 2021-06-06 ENCOUNTER — Other Ambulatory Visit: Payer: Self-pay

## 2021-06-28 ENCOUNTER — Telehealth: Payer: Self-pay | Admitting: Physician Assistant

## 2021-06-28 DIAGNOSIS — J208 Acute bronchitis due to other specified organisms: Secondary | ICD-10-CM

## 2021-06-28 DIAGNOSIS — B9689 Other specified bacterial agents as the cause of diseases classified elsewhere: Secondary | ICD-10-CM

## 2021-06-28 MED ORDER — AZITHROMYCIN 250 MG PO TABS: ORAL_TABLET | ORAL | 0 refills | Status: DC

## 2021-06-28 MED ORDER — PREDNISONE 20 MG PO TABS: 40.0000 mg | ORAL_TABLET | Freq: Every day | ORAL | 0 refills | Status: DC

## 2021-06-28 MED ORDER — PSEUDOEPH-BROMPHEN-DM 30-2-10 MG/5ML PO SYRP: 5.0000 mL | ORAL_SOLUTION | Freq: Four times a day (QID) | ORAL | 0 refills | Status: DC | PRN

## 2021-06-28 MED ORDER — BENZONATATE 100 MG PO CAPS: 100.0000 mg | ORAL_CAPSULE | Freq: Three times a day (TID) | ORAL | 0 refills | Status: DC | PRN

## 2021-06-28 NOTE — Progress Notes (Signed)
Virtual Visit Consent   Brandi Molina, you are scheduled for a virtual visit with a [facility] provider today.     Just as with appointments in the office, your consent must be obtained to participate.  Your consent will be active for this visit and any virtual visit you may have with one of our providers in the next 365 days.     If you have a MyChart account, a copy of this consent can be sent to you electronically.  All virtual visits are billed to your insurance company just like a traditional visit in the office.    As this is a virtual visit, video technology does not allow for your provider to perform a traditional examination.  This may limit your provider's ability to fully assess your condition.  If your provider identifies any concerns that need to be evaluated in person or the need to arrange testing (such as labs, EKG, etc.), we will make arrangements to do so.     Although advances in technology are sophisticated, we cannot ensure that it will always work on either your end or our end.  If the connection with a video visit is poor, the visit may have to be switched to a telephone visit.  With either a video or telephone visit, we are not always able to ensure that we have a secure connection.     I need to obtain your verbal consent now.   Are you willing to proceed with your visit today?    Elmer Shawndra Clute has provided verbal consent on 06/28/2021 for a virtual visit (video or telephone).   Mar Daring, PA-C   Date: 06/28/2021 6:27 PM   Virtual Visit via Video Note   I, Mar Daring, connected with  Brandi Molina  ([id number], August 17, 1965) on 06/28/21 at  6:15 PM EST by a video-enabled telemedicine application and verified that I am speaking with the correct person using two identifiers.  Location: Patient: Virtual Visit Location Patient: Home Provider: Virtual Visit Location Provider: Home Office   I discussed the limitations of  evaluation and management by telemedicine and the availability of in person appointments. The patient expressed understanding and agreed to proceed.    History of Present Illness: Brandi Molina is a 55 y.o. who identifies as a female who was assigned female at birth, and is being seen today for URI symptoms.  HPI: URI  This is a new problem. The current episode started in the past 7 days. The problem has been gradually worsening. Maximum temperature: reports low grade fever. Associated symptoms include congestion, coughing, diarrhea (loose stools), ear pain (right ear), rhinorrhea, sinus pain (last week; now better), a sore throat and vomiting (with coughing). Pertinent negatives include no nausea or plugged ear sensation. Treatments tried: tylenol, sinus decongestant. The treatment provided no relief.   Covid testing yesterday was negative.  Problems:  Patient Active Problem List   Diagnosis Date Noted   Generalized anxiety disorder 10/31/2015   Vitamin D deficiency 09/20/2015   Chronic back pain 09/19/2015   Nicotine dependence 09/03/2014   Aortoiliac occlusive disease ([place]) 09/03/2014    Allergies: No Known Allergies Medications:  Current Outpatient Medications:    azithromycin (ZITHROMAX) 250 MG tablet, Take 2 tablets PO on day one, and one tablet PO daily thereafter until completed., Disp: 6 tablet, Rfl: 0   benzonatate (TESSALON) 100 MG capsule, Take 1 capsule (100 mg total) by mouth 3 (three) times daily as needed., Disp:  30 capsule, Rfl: 0   brompheniramine-pseudoephedrine-DM 30-2-10 MG/5ML syrup, Take 5 mLs by mouth 4 (four) times daily as needed., Disp: 120 mL, Rfl: 0   predniSONE (DELTASONE) 20 MG tablet, Take 2 tablets (40 mg total) by mouth daily with breakfast., Disp: 10 tablet, Rfl: 0   acetaminophen (TYLENOL) 500 MG tablet, Take 1 tablet (500 mg total) by mouth every 6 (six) hours as needed., Disp: 30 tablet, Rfl: 0   aspirin 81 MG tablet, Take 81 mg by mouth daily.  (Patient not taking: Reported on 05/15/2021), Disp: , Rfl:    atorvastatin (LIPITOR) 20 MG tablet, TAKE 1 TABLET (20 MG TOTAL) BY MOUTH DAILY., Disp: 30 tablet, Rfl: 6   cetirizine (ZYRTEC) 10 MG tablet, Take 1 tablet (10 mg total) by mouth daily. (Patient not taking: Reported on 05/15/2021), Disp: 30 tablet, Rfl: 11   Cholecalciferol (VITAMIN D) 50 MCG (2000 UT) CAPS, Take 1 capsule (2,000 Units total) by mouth daily., Disp: 30 capsule, Rfl: 6   fluticasone (FLONASE) 50 MCG/ACT nasal spray, Place 2 sprays into both nostrils daily., Disp: 16 g, Rfl: 6   hydrOXYzine (ATARAX/VISTARIL) 10 MG tablet, TAKE 1 TABLET (10 MG TOTAL) BY MOUTH 3 (THREE) TIMES DAILY AS NEEDED FOR UP TO 10 DAYS FOR ITCHING. (Patient not taking: Reported on 05/15/2021), Disp: 30 tablet, Rfl: 0   lidocaine (XYLOCAINE) 2 % solution, Apply to affected area as needed for pain, Disp: 30 mL, Rfl: 1   meclizine (ANTIVERT) 25 MG tablet, Take 1 tablet (25 mg total) by mouth 3 (three) times daily as needed for dizziness., Disp: 30 tablet, Rfl: 0   Multiple Vitamin (MULTIVITAMIN) tablet, Take 1 tablet by mouth daily., Disp: , Rfl:    naproxen (NAPROSYN) 375 MG tablet, Take 1 tablet (375 mg total) by mouth 2 (two) times daily., Disp: 30 tablet, Rfl: 6   nicotine (NICODERM CQ) 21 mg/24hr patch, Place 1 patch (21 mg total) onto the skin daily., Disp: 28 patch, Rfl: 0   nystatin cream (MYCOSTATIN), Apply 1 application topically 2 (two) times daily., Disp: 30 g, Rfl: 3   sertraline (ZOLOFT) 50 MG tablet, TAKE 1 TABLET (50 MG TOTAL) BY MOUTH DAILY., Disp: 30 tablet, Rfl: 0  Current Facility-Administered Medications:    lidocaine (XYLOCAINE) 0.5 % (with pres) injection 5 mL, 5 mL, Intradermal, Once, Dorena Dew, FNP  Observations/Objective: Patient is well-developed, well-nourished in no acute distress.  Resting comfortably at home.  Head is normocephalic, atraumatic.  No labored breathing.  Speech is clear and coherent with logical  content.  Patient is alert and oriented at baseline.    Assessment and Plan: 1. Acute bacterial bronchitis - azithromycin (ZITHROMAX) 250 MG tablet; Take 2 tablets PO on day one, and one tablet PO daily thereafter until completed.  Dispense: 6 tablet; Refill: 0 - predniSONE (DELTASONE) 20 MG tablet; Take 2 tablets (40 mg total) by mouth daily with breakfast.  Dispense: 10 tablet; Refill: 0 - benzonatate (TESSALON) 100 MG capsule; Take 1 capsule (100 mg total) by mouth 3 (three) times daily as needed.  Dispense: 30 capsule; Refill: 0 - brompheniramine-pseudoephedrine-DM 30-2-10 MG/5ML syrup; Take 5 mLs by mouth 4 (four) times daily as needed.  Dispense: 120 mL; Refill: 0  - Worsening despite OTC medications.  - Will treat with zpak, prednisone, bromfed DM and tessalon perles.  - Push fluids.  - Rest.  - Call or seek in person evaluation if worsening.   Follow Up Instructions: I discussed the assessment and treatment plan  with the patient. The patient was provided an opportunity to ask questions and all were answered. The patient agreed with the plan and demonstrated an understanding of the instructions.  A copy of instructions were sent to the patient via MyChart unless otherwise noted below.    The patient was advised to call back or seek an in-person evaluation if the symptoms worsen or if the condition fails to improve as anticipated.  Time:  I spent 11 minutes with the patient via telehealth technology discussing the above problems/concerns.    Mar Daring, PA-C

## 2021-06-28 NOTE — Patient Instructions (Signed)
Hula Leonia Reeves, thank you for joining Mar Daring, PA-C for today's virtual visit.  While this provider is not your primary care provider (PCP), if your PCP is located in our provider database this encounter information will be shared with them immediately following your visit.  Consent: (Patient) Brandi Molina provided verbal consent for this virtual visit at the beginning of the encounter.  Current Medications:  Current Outpatient Medications:    azithromycin (ZITHROMAX) 250 MG tablet, Take 2 tablets PO on day one, and one tablet PO daily thereafter until completed., Disp: 6 tablet, Rfl: 0   benzonatate (TESSALON) 100 MG capsule, Take 1 capsule (100 mg total) by mouth 3 (three) times daily as needed., Disp: 30 capsule, Rfl: 0   brompheniramine-pseudoephedrine-DM 30-2-10 MG/5ML syrup, Take 5 mLs by mouth 4 (four) times daily as needed., Disp: 120 mL, Rfl: 0   predniSONE (DELTASONE) 20 MG tablet, Take 2 tablets (40 mg total) by mouth daily with breakfast., Disp: 10 tablet, Rfl: 0   acetaminophen (TYLENOL) 500 MG tablet, Take 1 tablet (500 mg total) by mouth every 6 (six) hours as needed., Disp: 30 tablet, Rfl: 0   aspirin 81 MG tablet, Take 81 mg by mouth daily. (Patient not taking: Reported on 05/15/2021), Disp: , Rfl:    atorvastatin (LIPITOR) 20 MG tablet, TAKE 1 TABLET (20 MG TOTAL) BY MOUTH DAILY., Disp: 30 tablet, Rfl: 6   cetirizine (ZYRTEC) 10 MG tablet, Take 1 tablet (10 mg total) by mouth daily. (Patient not taking: Reported on 05/15/2021), Disp: 30 tablet, Rfl: 11   Cholecalciferol (VITAMIN D) 50 MCG (2000 UT) CAPS, Take 1 capsule (2,000 Units total) by mouth daily., Disp: 30 capsule, Rfl: 6   fluticasone (FLONASE) 50 MCG/ACT nasal spray, Place 2 sprays into both nostrils daily., Disp: 16 g, Rfl: 6   hydrOXYzine (ATARAX/VISTARIL) 10 MG tablet, TAKE 1 TABLET (10 MG TOTAL) BY MOUTH 3 (THREE) TIMES DAILY AS NEEDED FOR UP TO 10 DAYS FOR ITCHING. (Patient not taking:  Reported on 05/15/2021), Disp: 30 tablet, Rfl: 0   lidocaine (XYLOCAINE) 2 % solution, Apply to affected area as needed for pain, Disp: 30 mL, Rfl: 1   meclizine (ANTIVERT) 25 MG tablet, Take 1 tablet (25 mg total) by mouth 3 (three) times daily as needed for dizziness., Disp: 30 tablet, Rfl: 0   Multiple Vitamin (MULTIVITAMIN) tablet, Take 1 tablet by mouth daily., Disp: , Rfl:    naproxen (NAPROSYN) 375 MG tablet, Take 1 tablet (375 mg total) by mouth 2 (two) times daily., Disp: 30 tablet, Rfl: 6   nicotine (NICODERM CQ) 21 mg/24hr patch, Place 1 patch (21 mg total) onto the skin daily., Disp: 28 patch, Rfl: 0   nystatin cream (MYCOSTATIN), Apply 1 application topically 2 (two) times daily., Disp: 30 g, Rfl: 3   sertraline (ZOLOFT) 50 MG tablet, TAKE 1 TABLET (50 MG TOTAL) BY MOUTH DAILY., Disp: 30 tablet, Rfl: 0  Current Facility-Administered Medications:    lidocaine (XYLOCAINE) 0.5 % (with pres) injection 5 mL, 5 mL, Intradermal, Once, Dorena Dew, FNP   Medications ordered in this encounter:  Meds ordered this encounter  Medications   azithromycin (ZITHROMAX) 250 MG tablet    Sig: Take 2 tablets PO on day one, and one tablet PO daily thereafter until completed.    Dispense:  6 tablet    Refill:  0    Order Specific Question:   Supervising Provider    Answer:   Noemi Chapel [[id number]]  predniSONE (DELTASONE) 20 MG tablet    Sig: Take 2 tablets (40 mg total) by mouth daily with breakfast.    Dispense:  10 tablet    Refill:  0    Order Specific Question:   Supervising Provider    Answer:   MILLER, BRIAN [[id number]]   benzonatate (TESSALON) 100 MG capsule    Sig: Take 1 capsule (100 mg total) by mouth 3 (three) times daily as needed.    Dispense:  30 capsule    Refill:  0    Order Specific Question:   Supervising Provider    Answer:   Sabra Heck, BRIAN [[id number]]   brompheniramine-pseudoephedrine-DM 30-2-10 MG/5ML syrup    Sig: Take 5 mLs by mouth 4 (four) times daily as needed.     Dispense:  120 mL    Refill:  0    Order Specific Question:   Supervising Provider    Answer:   Sabra Heck, BRIAN [[id number]]     *If you need refills on other medications prior to your next appointment, please contact your pharmacy*  Follow-Up: Call back or seek an in-person evaluation if the symptoms worsen or if the condition fails to improve as anticipated.  Other Instructions Acute Bronchitis, Adult Acute bronchitis is when air tubes in the lungs (bronchi) suddenly get swollen. The condition can make it hard for you to breathe. In adults, acute bronchitis usually goes away within 2 weeks. A cough caused by bronchitis may last up to 3 weeks. Smoking, allergies, and asthma can make the condition worse. What are the causes? Germs that cause cold and flu (viruses). The most common cause of this condition is the virus that causes the common cold. Bacteria. Substances that bother (irritate) the lungs, including: Smoke from cigarettes and other types of tobacco. Dust and pollen. Fumes from chemicals, gases, or burned fuel. Indoor or outdoor air pollution. What increases the risk? A weak body's defense system. This is also called the immune system. Any condition that affects your lungs and breathing, such as asthma. What are the signs or symptoms? A cough. Coughing up clear, yellow, or green mucus. Making high-pitched whistling sounds when you breathe, most often when you breathe out (wheezing). Runny or stuffy nose. Having too much mucus in your lungs (chest congestion). Shortness of breath. Body aches. A sore throat. How is this treated? Acute bronchitis may go away over time without treatment. Your doctor may tell you to: Drink more fluids. This will help thin your mucus so it is easier to cough up. Use a device that gets medicine into your lungs (inhaler). Use a vaporizer or a humidifier. These are machines that add water to the air. This helps with coughing and poor breathing. Take a  medicine that thins mucus and helps clear it from your lungs. Take a medicine that prevents or stops coughing. It is not common to take an antibiotic medicine for this condition. Follow these instructions at home:  Take over-the-counter and prescription medicines only as told by your doctor. Use an inhaler, vaporizer, or humidifier as told by your doctor. Take two teaspoons (10 mL) of honey at bedtime. This helps lessen your coughing at night. Drink enough fluid to keep your pee (urine) pale yellow. Do not smoke or use any products that contain nicotine or tobacco. If you need help quitting, ask your doctor. Get a lot of rest. Return to your normal activities when your doctor says that it is safe. Keep all follow-up visits. How is this prevented?  Wash your hands often with soap and water for at least 20 seconds. If you cannot use soap and water, use hand sanitizer. Avoid contact with people who have cold symptoms. Try not to touch your mouth, nose, or eyes with your hands. Avoid breathing in smoke or chemical fumes. Make sure to get the flu shot every year. Contact a doctor if: Your symptoms do not get better in 2 weeks. You have trouble coughing up the mucus. Your cough keeps you awake at night. You have a fever. Get help right away if: You cough up blood. You have chest pain. You have very bad shortness of breath. You faint or keep feeling like you are going to faint. You have a very bad headache. Your fever or chills get worse. These symptoms may be an emergency. Get help right away. Call your local emergency services (911 in the [place] Do not wait to see if the symptoms will go away. Do not drive yourself to the hospital. Summary Acute bronchitis is when air tubes in the lungs (bronchi) suddenly get swollen. In adults, acute bronchitis usually goes away within 2 weeks. Drink more fluids. This will help thin your mucus so it is easier to cough up. Take over-the-counter and  prescription medicines only as told by your doctor. Contact a doctor if your symptoms do not improve after 2 weeks of treatment. This information is not intended to replace advice given to you by your health care provider. Make sure you discuss any questions you have with your health care provider. Document Revised: 11/23/2020 Document Reviewed: 11/23/2020 Elsevier Patient Education  2022 [employer].    If you have been instructed to have an in-person evaluation today at a local Urgent Care facility, please use the link below. It will take you to a list of all of our available [facility], including address, phone number and hours of operation. Please do not delay care.  [facility]  If you or a family member do not have a primary care provider, use the link below to schedule a visit and establish care. When you choose a [facility] primary care physician or advanced practice provider, you gain a long-term partner in health. Find a Primary Care Provider  Learn more about [facility]'s in-office and virtual care options: [facility]

## 2021-07-04 ENCOUNTER — Ambulatory Visit
Admission: RE | Admit: 2021-07-04 | Discharge: 2021-07-04 | Disposition: A | Discharge status: Home / Self Care | Payer: No Typology Code available for payment source | Source: Ambulatory Visit | Attending: Obstetrics and Gynecology | Admitting: Obstetrics and Gynecology

## 2021-07-04 ENCOUNTER — Ambulatory Visit: Payer: Self-pay | Admitting: *Deleted

## 2021-07-04 ENCOUNTER — Other Ambulatory Visit: Payer: Self-pay

## 2021-07-04 VITALS — BP 124/70 | Wt 125.6 lb

## 2021-07-04 DIAGNOSIS — Z1239 Encounter for other screening for malignant neoplasm of breast: Secondary | ICD-10-CM

## 2021-07-04 DIAGNOSIS — Z1231 Encounter for screening mammogram for malignant neoplasm of breast: Secondary | ICD-10-CM

## 2021-07-04 DIAGNOSIS — Z1211 Encounter for screening for malignant neoplasm of colon: Secondary | ICD-10-CM

## 2021-07-04 NOTE — Patient Instructions (Signed)
Explained breast self awareness with [name]. Patient did not need a Pap smear today due to last Pap smear and HPV typing was 09/30/2018. Let her know [facility] will cover Pap smears and HPV typing every 5 years unless has a history of abnormal Pap smears. Referred patient to the [facility] for a screening mammogram on mobile unit. Appointment scheduled Tuesday, July 04, 2021 at 1510. Patient escorted to the mobile unit following [facility] appointment for her screening mammogram. Let [facility] know [facility] will follow up with her within the next couple weeks with results of her mammogram by letter or phone. Margrett Oldham Kardell verbalized understanding.  Rowan Blaker, Arvil Chaco, RN 2:28 PM

## 2021-07-04 NOTE — Progress Notes (Signed)
[name] is a 55 y.o. female who presents to [facility] clinic today with no complaints.    Pap Smear: Pap smear not completed today. Last Pap smear was 06/30/2018 at [facility] and was normal with negative HPV. Per patient has a history of an abnormal Pap smear 27 years ago that cryotherapy was completed for follow-up. Patient stated she has had at least three normal Pap smears since the cryotherapy was completed. Last Pap smear result is available in Epic.  Physical exam: Breasts Breasts symmetrical. No skin abnormalities bilateral breasts. No nipple retraction bilateral breasts. No nipple discharge bilateral breasts. No lymphadenopathy. No lumps palpated bilateral breasts. No complaints of pain or tenderness on exam.      MM Digital Diagnostic Unilat L  Result Date: 09/25/2018 CLINICAL DATA:  The patient returns after screening study for evaluation of LEFT breast calcifications. EXAM: DIGITAL DIAGNOSTIC LEFT MAMMOGRAM WITH CAD COMPARISON:  09/18/2018 and earlier ACR Breast Density Category c: The breast tissue is heterogeneously dense, which may obscure small masses. FINDINGS: Magnified views are performed of calcifications in the UPPER central portion of the LEFT breast. On magnified views there is a group of fine pleomorphic calcifications with a linear distribution in the UPPER central aspect of the LEFT breast. The group of calcifications measures 0.9 x 0.3 centimeters. Mammographic images were processed with CAD. IMPRESSION: Indeterminate LEFT breast calcifications. RECOMMENDATION: Stereotactic guided core biopsy of LEFT breast calcifications. I have discussed the findings and recommendations with the patient. Results were also provided in writing at the conclusion of the visit. If applicable, a reminder letter will be sent to the patient regarding the next appointment. BI-RADS CATEGORY  4: Suspicious. Electronically Signed   By: Nolon Nations M.D.   On:  09/25/2018 13:27   MS DIGITAL SCREENING TOMO BILATERAL  Result Date: 10/20/2019 CLINICAL DATA:  Screening. EXAM: DIGITAL SCREENING BILATERAL MAMMOGRAM WITH TOMO AND CAD COMPARISON:  Previous exam(s). ACR Breast Density Category c: The breast tissue is heterogeneously dense, which may obscure small masses. FINDINGS: There are no findings suspicious for malignancy. Images were processed with CAD. IMPRESSION: No mammographic evidence of malignancy. A result letter of this screening mammogram will be mailed directly to the patient. RECOMMENDATION: Screening mammogram in one year. (Code:SM-B-01Y) BI-RADS CATEGORY  1: Negative. Electronically Signed   By: Audie Pinto M.D.   On: 10/20/2019 12:08   MS DIGITAL SCREENING TOMO BILATERAL  Result Date: 09/18/2018 CLINICAL DATA:  Screening. EXAM: DIGITAL SCREENING BILATERAL MAMMOGRAM WITH TOMO AND CAD COMPARISON:  Previous exam(s). ACR Breast Density Category c: The breast tissue is heterogeneously dense, which may obscure small masses. FINDINGS: In the left breast, calcifications warrant further evaluation. In the right breast, no findings suspicious for malignancy. Images were processed with CAD. IMPRESSION: Further evaluation is suggested for calcifications in the left breast. RECOMMENDATION: Diagnostic mammogram of the left breast. (Code:FI-L-22M) The patient will be contacted regarding the findings, and additional imaging will be scheduled. BI-RADS CATEGORY  0: Incomplete. Need additional imaging evaluation and/or prior mammograms for comparison. Electronically Signed   By: Everlean Alstrom M.D.   On: 09/18/2018 15:33   MM CLIP PLACEMENT LEFT  Result Date: 09/30/2018 CLINICAL DATA:  55 year old female status post stereotactic biopsy of the left breast. EXAM: DIAGNOSTIC LEFT MAMMOGRAM POST STEREOTACTIC BIOPSY COMPARISON:  Previous exam(s). FINDINGS: Mammographic images were obtained following stereotactic guided biopsy of left breast calcifications. Coil  shaped clip is identified in the superior central aspect at middle depth.  There is approximately 1 cm inferior migration of the clip from the site of biopsied calcifications. IMPRESSION: Approximately 1 cm inferior migration of the clip from the site of biopsied calcifications. Final Assessment: Post Procedure Mammograms for Marker Placement Electronically Signed   By: Kristopher Oppenheim M.D.   On: 09/30/2018 09:58   MM LT BREAST BX W LOC DEV 1ST LESION IMAGE BX SPEC STEREO GUIDE  Addendum Date: 10/01/2018   ADDENDUM REPORT: 10/01/2018 11:26 ADDENDUM: Pathology revealed FIBROCYSTIC CHANGES WITH CALCIFICATIONS of the Left breast, superior central. This was found to be concordant by Dr. Kristopher Oppenheim. Pathology results were discussed with the patient by telephone. The patient reported doing well after the biopsy with tenderness at the site. Post biopsy instructions and care were reviewed and questions were answered. The patient was encouraged to call [facility] for any additional concerns. The patient was instructed to return for annual screening mammography and informed a reminder notice would be sent regarding this appointment. Pathology results reported by Terie Purser, RN on 10/01/2018. Electronically Signed   By: Kristopher Oppenheim M.D.   On: 10/01/2018 11:26   Result Date: 10/01/2018 CLINICAL DATA:  55 year old female with indeterminate left breast calcifications. EXAM: LEFT BREAST STEREOTACTIC CORE NEEDLE BIOPSY COMPARISON:  Previous exams. FINDINGS: The patient and I discussed the procedure of stereotactic-guided biopsy including benefits and alternatives. We discussed the high likelihood of a successful procedure. We discussed the risks of the procedure including infection, bleeding, tissue injury, clip migration, and inadequate sampling. Informed written consent was given. The usual time out protocol was performed immediately prior to the procedure. Using sterile technique and 1%  Lidocaine as local anesthetic, under stereotactic guidance, a 9 gauge vacuum assisted device was used to perform core needle biopsy of calcifications in the upper left breast using a superior approach. Specimen radiograph was performed showing calcifications in several specimens. Specimens with calcifications are identified for pathology. Lesion quadrant: Upper outer quadrant At the conclusion of the procedure, a coil shaped tissue marker clip was deployed into the biopsy cavity. Follow-up 2-view mammogram was performed and dictated separately. IMPRESSION: Stereotactic-guided biopsy of left breast calcifications. No apparent complications. Electronically Signed: By: Kristopher Oppenheim M.D. On: 09/30/2018 09:52    Pelvic/Bimanual Pap is not indicated today per BCCCP guidelines.   Smoking History: Patient is a current smoker. Discussed smoking cessation with patient. Referred to the [facility] Quitline and gave resources to the free smoking cessation classes at [facility].   Patient Navigation: Patient education provided. Access to services provided for patient through [facility] program.   Colorectal Cancer Screening: Patient had a colonoscopy completed 06/27/2018. FIT Test given to patient to complete. No complaints today. .    Breast and Cervical Cancer Risk Assessment: Patient has no family history of breast cancer, known genetic mutations, or radiation treatment to the chest before age 47. Per patient has a history of cervical dysplasia. Patient has no history of being immunocompromised or DES exposure in-utero.  Risk Assessment     Risk Scores       07/04/2021 10/20/2019   Last edited by: Demetrius Revel, LPN McGill, Sherie Mamie Nick, LPN   5-year risk: 1.4 % 1.1 %   Lifetime risk: 9.9 % 8.6 %            A: BCCCP exam without pap smear No complaints.  P: Referred patient to the [facility] for a screening mammogram on mobile unit. Appointment scheduled Tuesday, July 04, 2021  at  1510.  Loletta Parish, RN 07/04/2021 2:28 PM

## 2021-07-29 LAB — FECAL OCCULT BLOOD, IMMUNOCHEMICAL: Fecal Occult Bld: NEGATIVE

## 2021-08-03 ENCOUNTER — Other Ambulatory Visit: Payer: Self-pay

## 2021-08-03 ENCOUNTER — Encounter: Payer: Self-pay | Admitting: Obstetrics & Gynecology

## 2021-08-03 ENCOUNTER — Ambulatory Visit (INDEPENDENT_AMBULATORY_CARE_PROVIDER_SITE_OTHER): Payer: Self-pay | Admitting: Obstetrics & Gynecology

## 2021-08-03 VITALS — BP 116/75 | HR 79 | Wt 126.0 lb

## 2021-08-03 DIAGNOSIS — N764 Abscess of vulva: Secondary | ICD-10-CM

## 2021-08-03 MED ORDER — AMOXICILLIN-POT CLAVULANATE 875-125 MG PO TABS
1.0000 | ORAL_TABLET | Freq: Two times a day (BID) | ORAL | 0 refills | Status: DC
  Filled 2021-08-03: qty 20, 10d supply, fill #0

## 2021-08-03 NOTE — Progress Notes (Signed)
Patient ID: Brandi Molina, female   DOB: May 02, 1966, 55 y.o.   MRN: [id number]  Chief Complaint  Patient presents with   Gynecologic Exam    HPI Brandi Molina is a 55 y.o. female.  [id number] She has had Bartholin's gland drained 1 year ago now has constant purulent drainage from area of swelling on the left vulva, occasionally blood stained.  HPI  Past Medical History:  Diagnosis Date   Allergy    Anxiety    Bartholin gland cyst 2008   COVID 09/12/2020   Depression    Hyperlipidemia     Past Surgical History:  Procedure Laterality Date   BREAST BIOPSY Left 2020   CHOLECYSTECTOMY  1999    Family History  Problem Relation Age of Onset   Diabetes Mother    Heart disease Mother    Heart attack Mother    AAA (abdominal aortic aneurysm) Mother    COPD Mother    Cancer Father        lung   Hypertension Brother    Cancer - Other Brother        pallate cancer   Diabetes Brother    Anesthesia problems Neg Hx    Hypotension Neg Hx    Malignant hyperthermia Neg Hx    Pseudochol deficiency Neg Hx    Colon cancer Neg Hx    Stomach cancer Neg Hx    Rectal cancer Neg Hx    Esophageal cancer Neg Hx    Breast cancer Neg Hx     Social History Social History   Tobacco Use   Smoking status: Every Day    Packs/day: 1.00    Years: 20.00    Pack years: 20.00    Types: Cigarettes   Smokeless tobacco: Never  Vaping Use   Vaping Use: Never used  Substance Use Topics   Alcohol use: Yes    Comment: occ   Drug use: No    No Known Allergies  Current Outpatient Medications  Medication Sig Dispense Refill   amoxicillin-clavulanate (AUGMENTIN) 875-125 MG tablet Take 1 tablet by mouth 2 (two) times daily. 20 tablet 0   aspirin 81 MG tablet Take 81 mg by mouth daily.     atorvastatin (LIPITOR) 20 MG tablet TAKE 1 TABLET (20 MG TOTAL) BY MOUTH DAILY. 30 tablet 6   Cholecalciferol (VITAMIN D) 50 MCG (2000 UT) CAPS Take 1 capsule (2,000 Units total) by mouth daily. 30  capsule 6   Multiple Vitamin (MULTIVITAMIN) tablet Take 1 tablet by mouth daily.     naproxen (NAPROSYN) 375 MG tablet Take 1 tablet (375 mg total) by mouth 2 (two) times daily. 30 tablet 6   sertraline (ZOLOFT) 50 MG tablet TAKE 1 TABLET (50 MG TOTAL) BY MOUTH DAILY. 30 tablet 0   acetaminophen (TYLENOL) 500 MG tablet Take 1 tablet (500 mg total) by mouth every 6 (six) hours as needed. (Patient not taking: Reported on 08/03/2021) 30 tablet 0   azithromycin (ZITHROMAX) 250 MG tablet Take 2 tablets PO on day one, and one tablet PO daily thereafter until completed. (Patient not taking: Reported on 08/03/2021) 6 tablet 0   benzonatate (TESSALON) 100 MG capsule Take 1 capsule (100 mg total) by mouth 3 (three) times daily as needed. (Patient not taking: Reported on 08/03/2021) 30 capsule 0   brompheniramine-pseudoephedrine-DM 30-2-10 MG/5ML syrup Take 5 mLs by mouth 4 (four) times daily as needed. (Patient not taking: Reported on 08/03/2021) 120 mL 0   cetirizine (ZYRTEC) 10  MG tablet Take 1 tablet (10 mg total) by mouth daily. (Patient not taking: Reported on 05/15/2021) 30 tablet 11   fluticasone (FLONASE) 50 MCG/ACT nasal spray Place 2 sprays into both nostrils daily. 16 g 6   hydrOXYzine (ATARAX/VISTARIL) 10 MG tablet TAKE 1 TABLET (10 MG TOTAL) BY MOUTH 3 (THREE) TIMES DAILY AS NEEDED FOR UP TO 10 DAYS FOR ITCHING. (Patient not taking: Reported on 05/15/2021) 30 tablet 0   lidocaine (XYLOCAINE) 2 % solution Apply to affected area as needed for pain (Patient not taking: Reported on 08/03/2021) 30 mL 1   meclizine (ANTIVERT) 25 MG tablet Take 1 tablet (25 mg total) by mouth 3 (three) times daily as needed for dizziness. (Patient not taking: Reported on 08/03/2021) 30 tablet 0   nicotine (NICODERM CQ) 21 mg/24hr patch Place 1 patch (21 mg total) onto the skin daily. (Patient not taking: Reported on 08/03/2021) 28 patch 0   nystatin cream (MYCOSTATIN) Apply 1 application topically 2 (two) times daily.  (Patient not taking: Reported on 08/03/2021) 30 g 3   predniSONE (DELTASONE) 20 MG tablet Take 2 tablets (40 mg total) by mouth daily with breakfast. (Patient not taking: Reported on 08/03/2021) 10 tablet 0   Current Facility-Administered Medications  Medication Dose Route Frequency Provider Last Rate Last Admin   lidocaine (XYLOCAINE) 0.5 % (with pres) injection 5 mL  5 mL Intradermal Once Dorena Dew, FNP        Review of Systems Review of Systems  Constitutional: Negative.   Respiratory: Negative.    Genitourinary:  Positive for vaginal pain (purulent discharge on left). Negative for menstrual problem.   Blood pressure 116/75, pulse 79, weight 126 lb (57.2 kg), last menstrual period 09/20/2013.  Physical Exam Physical Exam Vitals and nursing note reviewed. Exam conducted with a chaperone present.  Constitutional:      Appearance: Normal appearance.  Cardiovascular:     Rate and Rhythm: Normal rate.  Abdominal:     General: Abdomen is flat.  Genitourinary:      Comments: 2 tracts with purulent drainage on left vulva, expressed Neurological:     Mental Status: She is alert.  Psychiatric:        Mood and Affect: Mood normal.        Behavior: Behavior normal.    Data Reviewed Office notes 06/2019 Bartholin's gland abscess  Assessment Vulvar abscess - Plan: Anaerobic and Aerobic Culture, amoxicillin-clavulanate (AUGMENTIN) 875-125 MG tablet    Plan Spontaneously draining, ABX ordered and F/U in 3 weeks    [name] 08/03/2021, 4:39 PM

## 2021-08-04 ENCOUNTER — Other Ambulatory Visit: Payer: Self-pay | Admitting: Nurse Practitioner

## 2021-08-04 ENCOUNTER — Other Ambulatory Visit: Payer: Self-pay

## 2021-08-04 DIAGNOSIS — F411 Generalized anxiety disorder: Secondary | ICD-10-CM

## 2021-08-07 ENCOUNTER — Other Ambulatory Visit: Payer: Self-pay

## 2021-08-08 ENCOUNTER — Other Ambulatory Visit: Payer: Self-pay

## 2021-08-08 ENCOUNTER — Other Ambulatory Visit: Payer: Self-pay | Admitting: Nurse Practitioner

## 2021-08-08 DIAGNOSIS — F411 Generalized anxiety disorder: Secondary | ICD-10-CM

## 2021-08-10 LAB — ANAEROBIC AND AEROBIC CULTURE

## 2021-08-16 ENCOUNTER — Other Ambulatory Visit: Payer: Self-pay

## 2021-08-16 ENCOUNTER — Encounter: Payer: Self-pay | Admitting: Nurse Practitioner

## 2021-08-16 ENCOUNTER — Ambulatory Visit (INDEPENDENT_AMBULATORY_CARE_PROVIDER_SITE_OTHER): Payer: Self-pay | Admitting: Nurse Practitioner

## 2021-08-16 VITALS — BP 110/71 | HR 79 | Temp 97.6°F | Ht 60.0 in | Wt 124.2 lb

## 2021-08-16 DIAGNOSIS — E782 Mixed hyperlipidemia: Secondary | ICD-10-CM

## 2021-08-16 DIAGNOSIS — F411 Generalized anxiety disorder: Secondary | ICD-10-CM

## 2021-08-16 DIAGNOSIS — I7409 Other arterial embolism and thrombosis of abdominal aorta: Secondary | ICD-10-CM

## 2021-08-16 DIAGNOSIS — H6991 Unspecified Eustachian tube disorder, right ear: Secondary | ICD-10-CM

## 2021-08-16 DIAGNOSIS — R82998 Other abnormal findings in urine: Secondary | ICD-10-CM

## 2021-08-16 DIAGNOSIS — R319 Hematuria, unspecified: Secondary | ICD-10-CM

## 2021-08-16 LAB — POCT URINALYSIS DIP (CLINITEK)
Bilirubin, UA: NEGATIVE
Glucose, UA: NEGATIVE mg/dL
Ketones, POC UA: NEGATIVE mg/dL
Nitrite, UA: NEGATIVE
POC PROTEIN,UA: NEGATIVE
Spec Grav, UA: 1.01 (ref 1.010–1.025)
Urobilinogen, UA: 0.2 E.U./dL
pH, UA: 5 (ref 5.0–8.0)

## 2021-08-16 MED ORDER — FLUTICASONE PROPIONATE 50 MCG/ACT NA SUSP
2.0000 | Freq: Every day | NASAL | 6 refills | Status: DC
  Filled 2021-08-16 – 2022-01-29 (×2): qty 16, 30d supply, fill #0

## 2021-08-16 MED ORDER — HYDROXYZINE HCL 10 MG PO TABS
10.0000 mg | ORAL_TABLET | Freq: Three times a day (TID) | ORAL | 5 refills | Status: AC | PRN
  Filled 2021-08-16: qty 30, 10d supply, fill #0

## 2021-08-16 MED ORDER — SERTRALINE HCL 50 MG PO TABS
50.0000 mg | ORAL_TABLET | Freq: Every day | ORAL | 1 refills | Status: DC
  Filled 2021-08-16 – 2021-09-10 (×2): qty 30, 30d supply, fill #0
  Filled 2021-11-07: qty 30, 30d supply, fill #1
  Filled 2022-01-04: qty 30, 30d supply, fill #2
  Filled 2022-01-29: qty 30, 30d supply, fill #3
  Filled 2022-03-05: qty 30, 30d supply, fill #4
  Filled 2022-04-01: qty 30, 30d supply, fill #5

## 2021-08-16 NOTE — Progress Notes (Signed)
[facility] [place], [place]  [address] Phone:  [phone]   Fax:  [phone]   Established Patient Office Visit  Subjective:  Patient ID: Brandi Molina, female    DOB: [birth year]  Age: 56 y.o. MRN: [id number]  CC:  Chief Complaint  Patient presents with   Follow-up    Pt is here today for her 3 month follow up visit with no concerns or issues.    HPI [name] presents for follow up. She  has a past medical history of Allergy, Anxiety, Bartholin gland cyst (2008), COVID (09/12/2020), Depression, and Hyperlipidemia.   She is in today for HLD follow up. She is prescribed atorvastatin 20 mg. She reports compliance without side effects.   She has not had any episodes of dizziness. She does keep the meclizine available .  She has has some pain in the right back of her head. She denies any changes in her vision. She denies any weakness or side of stroke, chest pain or shortness of breath.  She has occassions pain in her left upper arm.  She is mindful of MI and stroke symptoms. She does continue to smoke. She has not desire to discuss. She smokes more at home alone. She works fulltime as a Building control surveyor.  She reports her mood is stable on Sertraline 50 mg.  Past Medical History:  Diagnosis Date   Allergy    Anxiety    Bartholin gland cyst 2008   COVID 09/12/2020   Depression    Hyperlipidemia     Past Surgical History:  Procedure Laterality Date   BREAST BIOPSY Left 2020   CHOLECYSTECTOMY  1999    Family History  Problem Relation Age of Onset   Diabetes Mother    Heart disease Mother    Heart attack Mother    AAA (abdominal aortic aneurysm) Mother    COPD Mother    Cancer Father        lung   Hypertension Brother    Cancer - Other Brother        pallate cancer   Diabetes Brother    Anesthesia problems Neg Hx    Hypotension Neg Hx    Malignant hyperthermia Neg Hx    Pseudochol deficiency Neg Hx    Colon cancer  Neg Hx    Stomach cancer Neg Hx    Rectal cancer Neg Hx    Esophageal cancer Neg Hx    Breast cancer Neg Hx     Social History   Socioeconomic History   Marital status: Widowed    Spouse name: Not on file   Number of children: 1   Years of education: Not on file   Highest education level: 12th grade  Occupational History   Not on file  Tobacco Use   Smoking status: Every Day    Packs/day: 1.00    Years: 20.00    Pack years: 20.00    Types: Cigarettes   Smokeless tobacco: Never  Vaping Use   Vaping Use: Never used  Substance and Sexual Activity   Alcohol use: Not Currently    Comment: occ   Drug use: No   Sexual activity: Yes  Other Topics Concern   Not on file  Social History Narrative   Not on file   Social Determinants of Health   Financial Resource Strain: Not on file  Food Insecurity: No Food Insecurity   Worried About [place] in the  Last Year: Never true   Ran Out of Food in the Last Year: Never true  Transportation Needs: No Transportation Needs   Lack of Transportation (Medical): No   Lack of Transportation (Non-Medical): No  Physical Activity: Not on file  Stress: Not on file  Social Connections: Not on file  Intimate Partner Violence: Not on file    Outpatient Medications Prior to Visit  Medication Sig Dispense Refill   acetaminophen (TYLENOL) 500 MG tablet Take 1 tablet (500 mg total) by mouth every 6 (six) hours as needed. 30 tablet 0   aspirin 81 MG tablet Take 81 mg by mouth daily.     atorvastatin (LIPITOR) 20 MG tablet TAKE 1 TABLET (20 MG TOTAL) BY MOUTH DAILY. 30 tablet 6   Cholecalciferol (VITAMIN D) 50 MCG (2000 UT) CAPS Take 1 capsule (2,000 Units total) by mouth daily. 30 capsule 6   fluticasone (FLONASE) 50 MCG/ACT nasal spray Place 2 sprays into both nostrils daily. 16 g 6   lidocaine (XYLOCAINE) 2 % solution Apply to affected area as needed for pain 30 mL 1   meclizine (ANTIVERT) 25 MG tablet Take 1 tablet (25 mg total) by  mouth 3 (three) times daily as needed for dizziness. 30 tablet 0   Multiple Vitamin (MULTIVITAMIN) tablet Take 1 tablet by mouth daily.     naproxen (NAPROSYN) 375 MG tablet Take 1 tablet (375 mg total) by mouth 2 (two) times daily. 30 tablet 6   sertraline (ZOLOFT) 50 MG tablet TAKE 1 TABLET (50 MG TOTAL) BY MOUTH DAILY. 30 tablet 0   amoxicillin-clavulanate (AUGMENTIN) 875-125 MG tablet Take 1 tablet by mouth 2 (two) times daily. (Patient not taking: Reported on 08/16/2021) 20 tablet 0   azithromycin (ZITHROMAX) 250 MG tablet Take 2 tablets PO on day one, and one tablet PO daily thereafter until completed. (Patient not taking: Reported on 08/03/2021) 6 tablet 0   benzonatate (TESSALON) 100 MG capsule Take 1 capsule (100 mg total) by mouth 3 (three) times daily as needed. (Patient not taking: Reported on 08/03/2021) 30 capsule 0   brompheniramine-pseudoephedrine-DM 30-2-10 MG/5ML syrup Take 5 mLs by mouth 4 (four) times daily as needed. (Patient not taking: Reported on 08/03/2021) 120 mL 0   cetirizine (ZYRTEC) 10 MG tablet Take 1 tablet (10 mg total) by mouth daily. (Patient not taking: Reported on 08/16/2021) 30 tablet 11   hydrOXYzine (ATARAX/VISTARIL) 10 MG tablet TAKE 1 TABLET (10 MG TOTAL) BY MOUTH 3 (THREE) TIMES DAILY AS NEEDED FOR UP TO 10 DAYS FOR ITCHING. (Patient not taking: Reported on 05/15/2021) 30 tablet 0   nicotine (NICODERM CQ) 21 mg/24hr patch Place 1 patch (21 mg total) onto the skin daily. (Patient not taking: Reported on 08/03/2021) 28 patch 0   nystatin cream (MYCOSTATIN) Apply 1 application topically 2 (two) times daily. (Patient not taking: Reported on 08/03/2021) 30 g 3   predniSONE (DELTASONE) 20 MG tablet Take 2 tablets (40 mg total) by mouth daily with breakfast. (Patient not taking: Reported on 08/03/2021) 10 tablet 0   Facility-Administered Medications Prior to Visit  Medication Dose Route Frequency Provider Last Rate Last Admin   lidocaine (XYLOCAINE) 0.5 % (with pres)  injection 5 mL  5 mL Intradermal Once Dorena Dew, FNP        No Known Allergies  ROS Review of Systems  Respiratory:  Positive for cough.   Genitourinary:  Negative for difficulty urinating, dysuria, flank pain, frequency, hematuria, vaginal bleeding, vaginal discharge and vaginal pain.  Objective:    Physical Exam Constitutional:      Appearance: She is normal weight.  HENT:     Head: Normocephalic and atraumatic.     Nose: Nose normal.     Mouth/Throat:     Mouth: Mucous membranes are moist.  Cardiovascular:     Rate and Rhythm: Normal rate and regular rhythm.     Pulses: Normal pulses.     Heart sounds: Normal heart sounds.  Pulmonary:     Effort: Pulmonary effort is normal.     Comments: Diminshed in the bases bilateral Abdominal:     General: Bowel sounds are normal.     Palpations: Abdomen is soft.  Musculoskeletal:        General: Normal range of motion.     Cervical back: Normal range of motion.  Skin:    General: Skin is warm and dry.     Capillary Refill: Capillary refill takes less than 2 seconds.  Neurological:     General: No focal deficit present.     Mental Status: She is alert and oriented to person, place, and time.  Psychiatric:        Mood and Affect: Mood normal.        Behavior: Behavior normal.        Thought Content: Thought content normal.        Judgment: Judgment normal.    BP 110/71    Pulse 79    Temp 97.6 F (36.4 C)    Ht 5' (1.524 m)    Wt 124 lb 3.2 oz (56.3 kg)    LMP 09/20/2013 (LMP Unknown)    SpO2 100%    BMI 24.26 kg/m  Wt Readings from Last 3 Encounters:  08/16/21 124 lb 3.2 oz (56.3 kg)  08/03/21 126 lb (57.2 kg)  07/04/21 125 lb 9.6 oz (57 kg)     Health Maintenance Due  Topic Date Due   COVID-19 Vaccine (1) Never done   Pneumococcal Vaccine 58-14 Years old (1 - PCV) Never done   PAP SMEAR-Modifier  06/30/2021    There are no preventive care reminders to display for this patient.  Lab Results   Component Value Date   TSH 0.979 04/20/2021   Lab Results  Component Value Date   WBC 9.4 05/15/2021   HGB 15.6 05/15/2021   HCT 44.6 05/15/2021   MCV 93 05/15/2021   PLT 322 05/15/2021   Lab Results  Component Value Date   NA 142 05/15/2021   K 4.2 05/15/2021   CO2 17 (L) 06/08/2019   GLUCOSE 94 05/15/2021   BUN 9 05/15/2021   CREATININE 0.72 05/15/2021   BILITOT 0.3 05/15/2021   ALKPHOS 97 05/15/2021   AST 23 05/15/2021   ALT 17 06/08/2019   PROT 7.3 05/15/2021   ALBUMIN 4.7 05/15/2021   CALCIUM 9.7 05/15/2021   ANIONGAP 10 08/27/2015   EGFR 99 05/15/2021   Lab Results  Component Value Date   CHOL 116 05/15/2021   Lab Results  Component Value Date   HDL 38 (L) 05/15/2021   Lab Results  Component Value Date   LDLCALC 52 05/15/2021   Lab Results  Component Value Date   TRIG 153 (H) 05/15/2021   Lab Results  Component Value Date   CHOLHDL 3.1 05/15/2021   Lab Results  Component Value Date   HGBA1C 5.4 11/29/2017      Assessment & Plan:   Problem List Items Addressed This Visit  Cardiovascular and Mediastinum   Aortoiliac occlusive disease ([place])     Other   Generalized anxiety disorder Stable Continue with current regimen.  No changes warranted. Good patient compliance.    Other Visit Diagnoses     Mixed hyperlipidemia    -  Primary Stable  Encouraged on going compliance with current medication regimen Eating a heart-healthy diet with less salt and cholesterol  Encouraged regular physical activity     Relevant Orders   POCT URINALYSIS DIP (CLINITEK) (Completed)   Hematuria, unspecified type     Asymptomatic Information provided.     Relevant Orders   Urine Culture   Leukocytes in urine       Relevant Orders   Urine Culture       No orders of the defined types were placed in this encounter.   Follow-up: Return for 99213 HLD.    Vevelyn Francois, NP

## 2021-08-16 NOTE — Patient Instructions (Addendum)
Asymptomatic Bacteriuria Asymptomatic bacteriuria is the presence of a large number of bacteria in the urine without the usual symptoms of burning or frequent urination. This is usually not harmful, and treatment may not be needed. A person with this condition will not be more likely to develop an infection in the future. What are the causes? This condition is caused by an increase in bacteria in the urine. This increase can be caused by: Bacteria entering the urinary tract, such as during sex. A blockage in the urinary tract, such as from kidney stones or a tumor. Bladder problems that prevent the bladder from emptying. What increases the risk? You are more likely to develop this condition if: You have diabetes. You are an older adult. This especially affects older adults in long-term care facilities. You are pregnant and in the first trimester. You have kidney stones. You are female. You have had a kidney transplant. You have a leaky kidney tube valve (reflux). You had a urinary catheter for a long period of time. This is a long, thin tube that collects urine. What are the signs or symptoms? There are no symptoms of this condition. How is this diagnosed? This condition is diagnosed with a urine test. Because this condition does not cause symptoms, it is usually diagnosed when a urine sample is taken to treat or diagnose another condition, such as pregnancy or kidney problems. Most women who are in their first trimester of pregnancy are screened for asymptomatic bacteriuria. How is this treated? Usually, treatment is not needed for this condition. Treating the condition can lead to other problems, such as a yeast infection or the growth of bacteria that do not respond to treatment (antibiotic-resistant bacteria). Some people do need treatment with antibiotic medicines to prevent kidney infection, known as pyelonephritis. Treatment is needed if: You are pregnant. In pregnant women, kidney  infection can lead to: Early labor (premature labor). Very low birth weight (fetal growth restriction). Newborn death. You are having a procedure that affects the urinary tract. You have had a kidney transplant. If you are diagnosed with this condition, talk with your health care provider about any concerns that you have. Follow these instructions at home: Medicines Take over-the-counter and prescription medicines only as told by your health care provider. If you were prescribed an antibiotic medicine, take it as told by your health care provider. Do not stop using the antibiotic even if you start to feel better. General instructions Monitor your condition for any changes. Drink enough fluid to keep your urine pale yellow. Urinate more often to keep your bladder empty. If you are female, keep the area around your vagina and rectum clean. Wipe from front to back after urinating or having a bowel movement. Use each piece of toilet paper only once. Keep all follow-up visits. This is important. Contact a health care provider if: You have symptoms of a urine infection, such as: A burning sensation, or pain when you urinate. A strong need to urinate, or urinating more often. Urine turning discolored or cloudy. Blood in your urine. Urine that smells bad. Get help right away if: You develop signs of a kidney infection, such as: Back pain or pelvic pain. A fever or chills. Nausea or vomiting. Severe pain that cannot be controlled with medicine. Summary Asymptomatic bacteriuria is the presence of a large number of bacteria in the urine without the usual symptoms of burning or frequent urination. Usually, treatment is not needed for this condition. Treating the condition can lead  to other problems, such as a yeast infection or the growth of bacteria that do not respond to treatment. Some people do need treatment. Treatment is needed if you are pregnant, if you are having a procedure that  affects the urinary tract, or if you have had a kidney transplant. If you were prescribed an antibiotic medicine, take it as told by your health care provider. Do not stop using the antibiotic even if you start to feel better. This information is not intended to replace advice given to you by your health care provider. Make sure you discuss any questions you have with your health care provider. Document Revised: 03/04/2020 Document Reviewed: 03/04/2020 Elsevier Patient Education  [place].

## 2021-08-18 LAB — URINE CULTURE

## 2021-08-19 ENCOUNTER — Other Ambulatory Visit: Payer: Self-pay | Admitting: Nurse Practitioner

## 2021-08-19 DIAGNOSIS — N390 Urinary tract infection, site not specified: Secondary | ICD-10-CM

## 2021-08-19 MED ORDER — NITROFURANTOIN MONOHYD MACRO 100 MG PO CAPS
100.0000 mg | ORAL_CAPSULE | Freq: Two times a day (BID) | ORAL | 0 refills | Status: AC
  Filled 2021-08-19: qty 10, 5d supply, fill #0

## 2021-08-19 MED ORDER — NITROFURANTOIN MONOHYD MACRO 100 MG PO CAPS: 100.0000 mg | ORAL_CAPSULE | Freq: Two times a day (BID) | ORAL | 0 refills | Status: DC

## 2021-08-19 NOTE — Progress Notes (Signed)
[place], [place]  [id number] Phone:  [phone]   Fax:  [phone]

## 2021-08-21 ENCOUNTER — Other Ambulatory Visit: Payer: Self-pay

## 2021-08-22 ENCOUNTER — Other Ambulatory Visit: Payer: Self-pay

## 2021-08-23 ENCOUNTER — Other Ambulatory Visit: Payer: Self-pay

## 2021-08-24 ENCOUNTER — Other Ambulatory Visit: Payer: Self-pay

## 2021-08-24 ENCOUNTER — Ambulatory Visit (INDEPENDENT_AMBULATORY_CARE_PROVIDER_SITE_OTHER): Payer: Self-pay | Admitting: Obstetrics & Gynecology

## 2021-08-24 VITALS — BP 119/76 | HR 82 | Wt 124.6 lb

## 2021-08-24 DIAGNOSIS — N751 Abscess of Bartholin's gland: Secondary | ICD-10-CM

## 2021-08-24 MED ORDER — SULFAMETHOXAZOLE-TRIMETHOPRIM 800-160 MG PO TABS
1.0000 | ORAL_TABLET | Freq: Two times a day (BID) | ORAL | 1 refills | Status: DC
  Filled 2021-08-24: qty 42, 21d supply, fill #0
  Filled 2021-09-14: qty 42, 21d supply, fill #1

## 2021-08-24 NOTE — Progress Notes (Signed)
Chief Complaint  Patient presents with   Gynecologic Exam      HPI Brandi Molina is a 56 y.o. female.  [id number] Her sx of swelling and drainage improved on Augmentin then returned after completing the course of medication. HPI       Past Medical History:  Diagnosis Date   Allergy     Anxiety     Bartholin gland cyst 2008   COVID 09/12/2020   Depression     Hyperlipidemia             Past Surgical History:  Procedure Laterality Date   BREAST BIOPSY Left 2020   CHOLECYSTECTOMY   1999           Family History  Problem Relation Age of Onset   Diabetes Mother     Heart disease Mother     Heart attack Mother     AAA (abdominal aortic aneurysm) Mother     COPD Mother     Cancer Father          lung   Hypertension Brother     Cancer - Other Brother          pallate cancer   Diabetes Brother     Anesthesia problems Neg Hx     Hypotension Neg Hx     Malignant hyperthermia Neg Hx     Pseudochol deficiency Neg Hx     Colon cancer Neg Hx     Stomach cancer Neg Hx     Rectal cancer Neg Hx     Esophageal cancer Neg Hx     Breast cancer Neg Hx        Social History Social History         Tobacco Use   Smoking status: Every Day      Packs/day: 1.00      Years: 20.00      Pack years: 20.00      Types: Cigarettes   Smokeless tobacco: Never  Vaping Use   Vaping Use: Never used  Substance Use Topics   Alcohol use: Yes      Comment: occ   Drug use: No      No Known Allergies         Current Outpatient Medications  Medication Sig Dispense Refill   amoxicillin-clavulanate (AUGMENTIN) 875-125 MG tablet Take 1 tablet by mouth 2 (two) times daily. 20 tablet 0   aspirin 81 MG tablet Take 81 mg by mouth daily.       atorvastatin (LIPITOR) 20 MG tablet TAKE 1 TABLET (20 MG TOTAL) BY MOUTH DAILY. 30 tablet 6   Cholecalciferol (VITAMIN D) 50 MCG (2000 UT) CAPS Take 1 capsule (2,000 Units total) by mouth daily. 30 capsule 6   Multiple Vitamin (MULTIVITAMIN)  tablet Take 1 tablet by mouth daily.       naproxen (NAPROSYN) 375 MG tablet Take 1 tablet (375 mg total) by mouth 2 (two) times daily. 30 tablet 6   sertraline (ZOLOFT) 50 MG tablet TAKE 1 TABLET (50 MG TOTAL) BY MOUTH DAILY. 30 tablet 0   acetaminophen (TYLENOL) 500 MG tablet Take 1 tablet (500 mg total) by mouth every 6 (six) hours as needed. (Patient not taking: Reported on 08/03/2021) 30 tablet 0   azithromycin (ZITHROMAX) 250 MG tablet Take 2 tablets PO on day one, and one tablet PO daily thereafter until completed. (Patient not taking: Reported on 08/03/2021) 6 tablet 0   benzonatate (TESSALON) 100 MG capsule Take 1 capsule (  100 mg total) by mouth 3 (three) times daily as needed. (Patient not taking: Reported on 08/03/2021) 30 capsule 0   brompheniramine-pseudoephedrine-DM 30-2-10 MG/5ML syrup Take 5 mLs by mouth 4 (four) times daily as needed. (Patient not taking: Reported on 08/03/2021) 120 mL 0   cetirizine (ZYRTEC) 10 MG tablet Take 1 tablet (10 mg total) by mouth daily. (Patient not taking: Reported on 05/15/2021) 30 tablet 11   fluticasone (FLONASE) 50 MCG/ACT nasal spray Place 2 sprays into both nostrils daily. 16 g 6   hydrOXYzine (ATARAX/VISTARIL) 10 MG tablet TAKE 1 TABLET (10 MG TOTAL) BY MOUTH 3 (THREE) TIMES DAILY AS NEEDED FOR UP TO 10 DAYS FOR ITCHING. (Patient not taking: Reported on 05/15/2021) 30 tablet 0   lidocaine (XYLOCAINE) 2 % solution Apply to affected area as needed for pain (Patient not taking: Reported on 08/03/2021) 30 mL 1   meclizine (ANTIVERT) 25 MG tablet Take 1 tablet (25 mg total) by mouth 3 (three) times daily as needed for dizziness. (Patient not taking: Reported on 08/03/2021) 30 tablet 0   nicotine (NICODERM CQ) 21 mg/24hr patch Place 1 patch (21 mg total) onto the skin daily. (Patient not taking: Reported on 08/03/2021) 28 patch 0   nystatin cream (MYCOSTATIN) Apply 1 application topically 2 (two) times daily. (Patient not taking: Reported on 08/03/2021) 30  g 3   predniSONE (DELTASONE) 20 MG tablet Take 2 tablets (40 mg total) by mouth daily with breakfast. (Patient not taking: Reported on 08/03/2021) 10 tablet 0             Current Facility-Administered Medications  Medication Dose Route Frequency Provider Last Rate Last Admin   lidocaine (XYLOCAINE) 0.5 % (with pres) injection 5 mL  5 mL Intradermal Once Dorena Dew, FNP          Review of Systems Review of Systems  Constitutional: Negative.   Respiratory: Negative.    Genitourinary:  Positive for vaginal pain (purulent discharge on left). Negative for menstrual problem.    Blood pressure 119/76, pulse 82, weight 124 lb 9.6 oz (56.5 kg), last menstrual period 09/20/2013.    Physical Exam Physical Exam Vitals and nursing note reviewed. Exam conducted with a chaperone present.  Constitutional:      Appearance: Normal appearance.  Cardiovascular:     Rate and Rhythm: Normal rate.  Abdominal:     General: Abdomen is flat.  Genitourinary:      Comments: 2 tracts with purulent drainage on left vulva, expressed Neurological:     Mental Status: She is alert.  Psychiatric:        Mood and Affect: Mood normal.        Behavior: Behavior normal.      Data Reviewed Office notes 06/2019, 08/03/21 Bartholin's gland abscess  Microbiology result Assessment: Chronic bartholins gland abscess Vulvar abscess -  Meds ordered this encounter  Medications   sulfamethoxazole-trimethoprim (BACTRIM DS) 800-160 MG tablet    Sig: Take 1 tablet by mouth 2 (two) times daily.    Dispense:  42 tablet    Refill:  1          Plan Spontaneously draining,  new ABX ordered after review of culture and F/U in 3 weeks  Woodroe Mode, MD 08/24/2021

## 2021-09-04 ENCOUNTER — Other Ambulatory Visit: Payer: Self-pay

## 2021-09-05 ENCOUNTER — Other Ambulatory Visit: Payer: Self-pay

## 2021-09-11 ENCOUNTER — Other Ambulatory Visit: Payer: Self-pay

## 2021-09-14 ENCOUNTER — Encounter: Payer: Self-pay | Admitting: Obstetrics & Gynecology

## 2021-09-14 ENCOUNTER — Other Ambulatory Visit: Payer: Self-pay

## 2021-09-14 ENCOUNTER — Ambulatory Visit (INDEPENDENT_AMBULATORY_CARE_PROVIDER_SITE_OTHER): Payer: Self-pay | Admitting: Obstetrics & Gynecology

## 2021-09-14 VITALS — BP 111/65 | HR 79 | Wt 124.7 lb

## 2021-09-14 DIAGNOSIS — N764 Abscess of vulva: Secondary | ICD-10-CM

## 2021-09-14 NOTE — Progress Notes (Signed)
Patient ID: Brandi Molina, female   DOB: January 27, 1966, 56 y.o.   MRN: [id number]  F/u on vulvar abscess   HPI Brandi Molina is a 56 y.o. female.  [id number] She has much less symptoms and feel the abscess may have resolved HPI  Past Medical History:  Diagnosis Date   Allergy    Anxiety    Bartholin gland cyst 2008   COVID 09/12/2020   Depression    Hyperlipidemia     Past Surgical History:  Procedure Laterality Date   BREAST BIOPSY Left 2020   CHOLECYSTECTOMY  1999    Family History  Problem Relation Age of Onset   Diabetes Mother    Heart disease Mother    Heart attack Mother    AAA (abdominal aortic aneurysm) Mother    COPD Mother    Cancer Father        lung   Hypertension Brother    Cancer - Other Brother        pallate cancer   Diabetes Brother    Anesthesia problems Neg Hx    Hypotension Neg Hx    Malignant hyperthermia Neg Hx    Pseudochol deficiency Neg Hx    Colon cancer Neg Hx    Stomach cancer Neg Hx    Rectal cancer Neg Hx    Esophageal cancer Neg Hx    Breast cancer Neg Hx     Social History Social History   Tobacco Use   Smoking status: Every Day    Packs/day: 1.00    Years: 20.00    Pack years: 20.00    Types: Cigarettes   Smokeless tobacco: Never  Vaping Use   Vaping Use: Never used  Substance Use Topics   Alcohol use: Not Currently    Comment: occ   Drug use: No    No Known Allergies  Current Outpatient Medications  Medication Sig Dispense Refill   acetaminophen (TYLENOL) 500 MG tablet Take 1 tablet (500 mg total) by mouth every 6 (six) hours as needed. 30 tablet 0   aspirin 81 MG tablet Take 81 mg by mouth daily.     atorvastatin (LIPITOR) 20 MG tablet TAKE 1 TABLET (20 MG TOTAL) BY MOUTH DAILY. 30 tablet 6   Cholecalciferol (VITAMIN D) 50 MCG (2000 UT) CAPS Take 1 capsule (2,000 Units total) by mouth daily. 30 capsule 6   fluticasone (FLONASE) 50 MCG/ACT nasal spray Place 2 sprays into both nostrils daily. 16 g 6    hydrOXYzine (ATARAX) 10 MG tablet Take 1 tablet (10 mg total) by mouth 3 (three) times daily as needed. 30 tablet 5   lidocaine (XYLOCAINE) 2 % solution Apply to affected area as needed for pain 30 mL 1   meclizine (ANTIVERT) 25 MG tablet Take 1 tablet (25 mg total) by mouth 3 (three) times daily as needed for dizziness. 30 tablet 0   Multiple Vitamin (MULTIVITAMIN) tablet Take 1 tablet by mouth daily.     naproxen (NAPROSYN) 375 MG tablet Take 1 tablet (375 mg total) by mouth 2 (two) times daily. 30 tablet 6   sertraline (ZOLOFT) 50 MG tablet Take 1 tablet (50 mg total) by mouth daily. 90 tablet 1   sulfamethoxazole-trimethoprim (BACTRIM DS) 800-160 MG tablet Take 1 tablet by mouth 2 (two) times daily. 42 tablet 1   benzonatate (TESSALON) 100 MG capsule Take 1 capsule (100 mg total) by mouth 3 (three) times daily as needed. (Patient not taking: Reported on 08/03/2021) 30 capsule 0  cetirizine (ZYRTEC) 10 MG tablet Take 1 tablet (10 mg total) by mouth daily. (Patient not taking: Reported on 08/16/2021) 30 tablet 11   Current Facility-Administered Medications  Medication Dose Route Frequency Provider Last Rate Last Admin   lidocaine (XYLOCAINE) 0.5 % (with pres) injection 5 mL  5 mL Intradermal Once Dorena Dew, FNP        Review of Systems Review of Systems  Constitutional: Negative.   Genitourinary:  Negative for vaginal bleeding, vaginal discharge and vaginal pain.   Blood pressure 111/65, pulse 79, weight 124 lb 11.2 oz (56.6 kg), last menstrual period 09/20/2013.  Physical Exam Physical Exam Vitals and nursing note reviewed. Exam conducted with a chaperone present.  Pulmonary:     Effort: Pulmonary effort is normal.  Neurological:     Mental Status: She is alert.  Psychiatric:        Mood and Affect: Mood normal.        Behavior: Behavior normal.   Genitourinary:      Comments: 2 tracts with purulent drainage on left vulva, expressed Data Reviewed Wound  cultures  Assessment Persistent drainage from vulvar abscess after abx treatment  Plan RTC to have drain placed under local  Continue Abx    [name] 09/14/2021, 5:05 PM

## 2021-09-14 NOTE — Progress Notes (Signed)
Patient in for Bartholin's gland abscess follow up. States she feels like this has completely resolved, no issues at this time. Completed medication treatment as prescribed.  Alanson Hausmann,CMA

## 2021-09-15 ENCOUNTER — Other Ambulatory Visit: Payer: Self-pay

## 2021-09-27 ENCOUNTER — Encounter: Payer: Self-pay | Admitting: Obstetrics & Gynecology

## 2021-09-27 ENCOUNTER — Other Ambulatory Visit: Payer: Self-pay

## 2021-09-27 ENCOUNTER — Ambulatory Visit (INDEPENDENT_AMBULATORY_CARE_PROVIDER_SITE_OTHER): Payer: Self-pay | Admitting: Obstetrics & Gynecology

## 2021-09-27 VITALS — BP 119/65 | HR 72 | Ht 60.0 in | Wt 126.0 lb

## 2021-09-27 DIAGNOSIS — N751 Abscess of Bartholin's gland: Secondary | ICD-10-CM

## 2021-09-27 NOTE — Progress Notes (Signed)
Bartholin Cyst I&D and Word Catheter Placement Enlarged abscess palpated in front of the hymenal ring around 5 or 7 o' clock.  Drainage tracts at the surface noted as before and pus expressed. Written informed consent was obtained.  Discussed complications and possible outcomes of procedure including recurrence of cyst, scarring leading to infection, bleeding, dyspareunia, distortion of anatomy.  Patient was examined in the dorsal lithotomy position and mass was identified.  The area was prepped with Iodine and draped in a sterile manner. 1% Lidocaine (3 ml) was then used to infiltrate area on top of the cyst, at an open tract at the hymenal ring.   A hemostat was used to break up loculations, which resulted in expression of more bloody purulent drainage and a Word catheter was placed. 1.5 ml of sterile water was used to inflate the catheter balloon.  The end of the catheter was tucked into the vagina.  Patient tolerated the procedure well.  - She can finish her antibiotic prescribed - Recommended Sitz baths bid and Motrin and Percocet was given  prn pain.   She was told to call to be examined if she experiences increasing swelling, pain, vaginal discharge, or fever.  - She was instructed to wear a peripad to absorb discharge, and to maintain pelvic rest while the Word catheter is in place.  -The catheter will be left in place for at least two to four weeks to promote formation of an epithelialized tract for permanent drainage of glandular secretions.  - She will [facility] weeks from now for removal of word catheter.   Woodroe Mode, MD 09/27/2021

## 2021-10-02 ENCOUNTER — Other Ambulatory Visit: Payer: Self-pay

## 2021-10-26 ENCOUNTER — Ambulatory Visit (INDEPENDENT_AMBULATORY_CARE_PROVIDER_SITE_OTHER): Payer: Self-pay | Admitting: Obstetrics and Gynecology

## 2021-10-26 ENCOUNTER — Encounter: Payer: Self-pay | Admitting: Obstetrics and Gynecology

## 2021-10-26 ENCOUNTER — Other Ambulatory Visit: Payer: Self-pay

## 2021-10-26 VITALS — BP 117/61 | HR 74 | Ht 60.0 in | Wt 124.0 lb

## 2021-10-26 DIAGNOSIS — N758 Other diseases of Bartholin's gland: Secondary | ICD-10-CM

## 2021-10-26 NOTE — Progress Notes (Signed)
56 yo returns for follow up on bartholin abscess. Patient reports feeling well and is without any complaints. She denies any pelvic pain. She completed her antibiotics and denies any vaginal drainage.  ? ?Past Medical History:  ?Diagnosis Date  ? Allergy   ? Anxiety   ? Bartholin gland cyst 2008  ? COVID 09/12/2020  ? Depression   ? Hyperlipidemia   ? ?Past Surgical History:  ?Procedure Laterality Date  ? BREAST BIOPSY Left 2020  ? CHOLECYSTECTOMY  1999  ? ?Family History  ?Problem Relation Age of Onset  ? Diabetes Mother   ? Heart disease Mother   ? Heart attack Mother   ? AAA (abdominal aortic aneurysm) Mother   ? COPD Mother   ? Cancer Father   ?     lung  ? Hypertension Brother   ? Cancer - Other Brother   ?     pallate cancer  ? Diabetes Brother   ? Anesthesia problems Neg Hx   ? Hypotension Neg Hx   ? Malignant hyperthermia Neg Hx   ? Pseudochol deficiency Neg Hx   ? Colon cancer Neg Hx   ? Stomach cancer Neg Hx   ? Rectal cancer Neg Hx   ? Esophageal cancer Neg Hx   ? Breast cancer Neg Hx   ? ?Social History  ? ?Tobacco Use  ? Smoking status: Every Day  ?  Packs/day: 1.00  ?  Years: 20.00  ?  Pack years: 20.00  ?  Types: Cigarettes  ? Smokeless tobacco: Never  ?Vaping Use  ? Vaping Use: Never used  ?Substance Use Topics  ? Alcohol use: Not Currently  ?  Comment: occ  ? Drug use: No  ? ?ROS ?See pertinent in HPI. All other systems reviewed and non contributory ?Blood pressure 117/61, pulse 74, height 5' (1.524 m), weight 124 lb (56.2 kg), last menstrual period 09/20/2013. ?GENERAL: Well-developed, well-nourished female in no acute distress.  ?ABDOMEN: Soft, nontender, nondistended. No organomegaly. ?PELVIC: Normal external female genitalia. Vagina is pink and rugated.  Normal discharge. Ward catheter removed. Chaperone present during the pelvic exam ?EXTREMITIES: No cyanosis, clubbing, or edema, 2+ distal pulses. ? ?A/P 56 yo with bartholin abscess ?- Ward catheter removed ?- Normal vulva without drainage ?-  Patient advised to continue warm compresses and sitz bath ?- Patient due for pap smear and plans to schedule annual exam ?

## 2021-11-01 ENCOUNTER — Other Ambulatory Visit: Payer: Self-pay | Admitting: Nurse Practitioner

## 2021-11-01 ENCOUNTER — Other Ambulatory Visit: Payer: Self-pay

## 2021-11-01 DIAGNOSIS — I7409 Other arterial embolism and thrombosis of abdominal aorta: Secondary | ICD-10-CM

## 2021-11-01 MED ORDER — ATORVASTATIN CALCIUM 20 MG PO TABS
ORAL_TABLET | Freq: Every day | ORAL | 6 refills | Status: DC
  Filled 2021-11-01: qty 30, 30d supply, fill #0
  Filled 2021-12-02: qty 30, 30d supply, fill #1
  Filled 2022-01-04: qty 30, 30d supply, fill #2
  Filled 2022-01-29: qty 30, 30d supply, fill #3
  Filled 2022-03-05: qty 30, 30d supply, fill #4
  Filled 2022-04-01: qty 30, 30d supply, fill #5
  Filled 2022-05-07 (×2): qty 30, 30d supply, fill #6

## 2021-11-02 ENCOUNTER — Other Ambulatory Visit: Payer: Self-pay

## 2021-11-07 ENCOUNTER — Other Ambulatory Visit: Payer: Self-pay

## 2021-11-22 ENCOUNTER — Ambulatory Visit: Payer: No Typology Code available for payment source | Admitting: Nurse Practitioner

## 2021-11-28 ENCOUNTER — Other Ambulatory Visit: Payer: Self-pay | Admitting: *Deleted

## 2021-11-28 DIAGNOSIS — Z124 Encounter for screening for malignant neoplasm of cervix: Secondary | ICD-10-CM

## 2021-11-28 NOTE — Progress Notes (Signed)
Patient: Brandi Molina           ?Date of Birth: 03/25/1966           ?MRN: [id number] ?Visit Date: 11/28/2021 ?PCP: Vevelyn Francois, NP ? ?Cervical Cancer Screening ?Do you smoke?: Yes ?Have you ever had or been told you have an allergy to latex products?: No ?Marital status: Widowed ?Date of last pap smear: 2-5 yrs ago (06/30/2018 Pap/HPV-Negative ([facility])) ?Date of last menstrual period:  (Postmenopausal) ?Number of pregnancies: 2 ?Number of births: 1 ?Have you ever had any of the following? ?Hysterectomy: No ?Tubal ligation (tubes tied): No ?Abnormal bleeding: No ?Abnormal pap smear: Yes (27 yrs ago, f/up cryotherapy) ?Venereal warts: No ?A sex partner with venereal warts: No ?A high risk* sex partner: No ? ?Cervical Exam ? ?Abnormal Observations: Normal Exam. ?Recommendations: Last Pap smear was 06/30/2018 at [facility] and was normal with negative HPV. Per patient has a history of an abnormal Pap smear over 27 years ago that cryotherapy was completed for follow-up. Patient stated she has had at least three normal Pap smears since the cryotherapy was completed. Last Pap smear result is available in Epic. Let patient know if today's Pap smear is normal and HPV negative that her next Pap smear is due in 5 years. Informed patient that will follow up with her within the next couple of weeks with results of her Pap smear by phone. Patient verbalized understanding. ? ? ? ? ?Patient's History ?Patient Active Problem List  ? Diagnosis Date Noted  ? Generalized anxiety disorder 10/31/2015  ? Vitamin D deficiency 09/20/2015  ? Chronic back pain 09/19/2015  ? Nicotine dependence 09/03/2014  ? Aortoiliac occlusive disease ([place]) 09/03/2014  ? ?Past Medical History:  ?Diagnosis Date  ? Allergy   ? Anxiety   ? Bartholin gland cyst 2008  ? COVID 09/12/2020  ? Depression   ? Hyperlipidemia   ?  ?Family History  ?Problem Relation Age of Onset  ? Diabetes Mother   ? Heart disease Mother   ? Heart  attack Mother   ? AAA (abdominal aortic aneurysm) Mother   ? COPD Mother   ? Cancer Father   ?     lung  ? Hypertension Brother   ? Cancer - Other Brother   ?     pallate cancer  ? Diabetes Brother   ? Anesthesia problems Neg Hx   ? Hypotension Neg Hx   ? Malignant hyperthermia Neg Hx   ? Pseudochol deficiency Neg Hx   ? Colon cancer Neg Hx   ? Stomach cancer Neg Hx   ? Rectal cancer Neg Hx   ? Esophageal cancer Neg Hx   ? Breast cancer Neg Hx   ?  ?Social History  ? ?Occupational History  ? Not on file  ?Tobacco Use  ? Smoking status: Every Day  ?  Packs/day: 1.00  ?  Years: 20.00  ?  Pack years: 20.00  ?  Types: Cigarettes  ? Smokeless tobacco: Never  ?Vaping Use  ? Vaping Use: Never used  ?Substance and Sexual Activity  ? Alcohol use: Not Currently  ?  Comment: occ  ? Drug use: No  ? Sexual activity: Yes  ? ?

## 2021-11-28 NOTE — Addendum Note (Signed)
Addended by: Demetrius Revel on: 11/28/2021 11:33 AM ? ? Modules accepted: Orders ? ?

## 2021-12-01 LAB — CYTOLOGY - PAP
Comment: NEGATIVE
Diagnosis: NEGATIVE
High risk HPV: NEGATIVE

## 2021-12-04 ENCOUNTER — Other Ambulatory Visit: Payer: Self-pay

## 2021-12-05 ENCOUNTER — Telehealth: Payer: Self-pay

## 2021-12-05 NOTE — Telephone Encounter (Signed)
Attempted to contact patient regarding pap results. Left message on voicemail requesting a return call.  

## 2022-01-04 ENCOUNTER — Other Ambulatory Visit: Payer: Self-pay

## 2022-01-29 ENCOUNTER — Other Ambulatory Visit: Payer: Self-pay

## 2022-01-30 ENCOUNTER — Other Ambulatory Visit: Payer: Self-pay

## 2022-01-31 ENCOUNTER — Other Ambulatory Visit: Payer: Self-pay

## 2022-02-12 ENCOUNTER — Other Ambulatory Visit: Payer: Self-pay

## 2022-02-19 ENCOUNTER — Other Ambulatory Visit: Payer: Self-pay

## 2022-03-05 ENCOUNTER — Other Ambulatory Visit: Payer: Self-pay

## 2022-04-02 ENCOUNTER — Other Ambulatory Visit: Payer: Self-pay

## 2022-04-03 ENCOUNTER — Other Ambulatory Visit: Payer: Self-pay

## 2022-05-07 ENCOUNTER — Other Ambulatory Visit: Payer: Self-pay

## 2022-05-08 ENCOUNTER — Other Ambulatory Visit: Payer: Self-pay

## 2022-05-30 ENCOUNTER — Telehealth: Payer: Self-pay | Admitting: Emergency Medicine

## 2022-05-30 NOTE — Telephone Encounter (Signed)
Copied from [place] [phone]. Topic: General - Other >> May 30, 2022  1:17 PM Leilani Able wrote: Reason for CRM: Pt states her card expired in May and wants to get recertified for her orange card. Please return call asap to [phone]

## 2022-06-08 ENCOUNTER — Ambulatory Visit (INDEPENDENT_AMBULATORY_CARE_PROVIDER_SITE_OTHER): Payer: Self-pay | Admitting: Nurse Practitioner

## 2022-06-08 ENCOUNTER — Encounter: Payer: Self-pay | Admitting: Nurse Practitioner

## 2022-06-08 ENCOUNTER — Other Ambulatory Visit: Payer: Self-pay

## 2022-06-08 ENCOUNTER — Telehealth: Payer: Self-pay | Admitting: Nurse Practitioner

## 2022-06-08 VITALS — BP 133/75 | HR 68 | Ht 60.0 in | Wt 123.0 lb

## 2022-06-08 DIAGNOSIS — I7409 Other arterial embolism and thrombosis of abdominal aorta: Secondary | ICD-10-CM

## 2022-06-08 DIAGNOSIS — F411 Generalized anxiety disorder: Secondary | ICD-10-CM

## 2022-06-08 DIAGNOSIS — R42 Dizziness and giddiness: Secondary | ICD-10-CM

## 2022-06-08 DIAGNOSIS — H6991 Unspecified Eustachian tube disorder, right ear: Secondary | ICD-10-CM

## 2022-06-08 DIAGNOSIS — J301 Allergic rhinitis due to pollen: Secondary | ICD-10-CM

## 2022-06-08 DIAGNOSIS — E782 Mixed hyperlipidemia: Secondary | ICD-10-CM

## 2022-06-08 MED ORDER — MECLIZINE HCL 25 MG PO TABS
25.0000 mg | ORAL_TABLET | Freq: Three times a day (TID) | ORAL | 0 refills | Status: AC | PRN
  Filled 2022-06-08: qty 30, 10d supply, fill #0

## 2022-06-08 MED ORDER — ATORVASTATIN CALCIUM 20 MG PO TABS
ORAL_TABLET | Freq: Every day | ORAL | 6 refills | Status: DC
  Filled 2022-06-08: qty 30, 30d supply, fill #0
  Filled 2022-07-05: qty 30, 30d supply, fill #1
  Filled 2022-08-06 – 2022-08-07 (×2): qty 30, 30d supply, fill #2
  Filled 2022-08-31 (×2): qty 30, 30d supply, fill #3
  Filled 2022-10-04 (×2): qty 30, 30d supply, fill #4
  Filled 2022-10-29: qty 30, 30d supply, fill #5
  Filled 2022-10-29: qty 60, 60d supply, fill #5

## 2022-06-08 MED ORDER — SERTRALINE HCL 50 MG PO TABS
50.0000 mg | ORAL_TABLET | Freq: Every day | ORAL | 1 refills | Status: DC
  Filled 2022-06-08: qty 30, 30d supply, fill #0
  Filled 2022-07-05: qty 30, 30d supply, fill #1
  Filled 2022-08-31 (×2): qty 30, 30d supply, fill #2
  Filled 2022-10-04 (×2): qty 30, 30d supply, fill #3
  Filled 2022-10-29: qty 30, 30d supply, fill #4
  Filled 2023-01-01 (×2): qty 30, 30d supply, fill #5

## 2022-06-08 MED ORDER — CETIRIZINE HCL 10 MG PO TABS
10.0000 mg | ORAL_TABLET | Freq: Every day | ORAL | 11 refills | Status: DC
  Filled 2022-06-08: qty 30, 30d supply, fill #0
  Filled 2022-07-05: qty 30, 30d supply, fill #1
  Filled 2022-08-06 – 2022-08-07 (×2): qty 30, 30d supply, fill #2
  Filled 2022-08-31 (×2): qty 30, 30d supply, fill #3
  Filled 2022-10-04 (×2): qty 30, 30d supply, fill #4
  Filled 2022-10-29: qty 30, 30d supply, fill #5
  Filled 2022-10-29: qty 90, 90d supply, fill #5
  Filled 2023-02-08 (×2): qty 90, 90d supply, fill #6
  Filled 2023-05-05: qty 30, 30d supply, fill #7

## 2022-06-08 MED ORDER — FLUTICASONE PROPIONATE 50 MCG/ACT NA SUSP
2.0000 | Freq: Every day | NASAL | 6 refills | Status: AC
  Filled 2022-06-08: qty 16, 30d supply, fill #0

## 2022-06-08 NOTE — Patient Instructions (Signed)
1. Vertigo  - meclizine (ANTIVERT) 25 MG tablet; Take 1 tablet (25 mg total) by mouth 3 (three) times daily as needed for dizziness.  Dispense: 30 tablet; Refill: 0  2. Aortoiliac occlusive disease ([facility])  - atorvastatin (LIPITOR) 20 MG tablet; TAKE 1 TABLET (20 MG TOTAL) BY MOUTH DAILY.  Dispense: 30 tablet; Refill: 6  3. Allergic rhinitis due to pollen, unspecified seasonality  - cetirizine (ZYRTEC) 10 MG tablet; Take 1 tablet (10 mg total) by mouth daily.  Dispense: 30 tablet; Refill: 11  4. Disorder of right eustachian tube  - fluticasone (FLONASE) 50 MCG/ACT nasal spray; Place 2 sprays into both nostrils daily.  Dispense: 16 g; Refill: 6  5. Generalized anxiety disorder  - sertraline (ZOLOFT) 50 MG tablet; Take 1 tablet (50 mg total) by mouth daily.  Dispense: 90 tablet; Refill: 1  6. Mixed hyperlipidemia  - CBC - Comprehensive metabolic panel - Lipid Panel  Follow up:  Follow up in 1 year

## 2022-06-08 NOTE — Progress Notes (Signed)
$'@Patient'X$  ID: Brandi Molina, female    DOB: [birth year], 56 y.o.   MRN: [id number]  Chief Complaint  Patient presents with   Follow-up    Pt states she is here to f/u and needs refills on all of her medications     Referring provider: Vevelyn Francois, NP   HPI  Brandi Molina presents for follow up. She  has a past medical history of Allergy, Anxiety, Bartholin gland cyst (2008), COVID (09/12/2020), Depression, and Hyperlipidemia.    Patient presents today for routine follow-up.  She does need a refill on her medications.  She does still have vertigo associated with right eustachian tube dysfunction.  Offered PT for Epley maneuvers but patient refuses at this time.  Patient is due for labs. Denies f/c/s, n/v/d, hemoptysis, PND, leg swelling Denies chest pain or edema     No Known Allergies  Immunization History  Administered Date(s) Administered   Influenza,inj,Quad PF,6+ Mos 05/01/2018   Influenza-Unspecified 05/14/2017   Tdap 06/02/2018    Past Medical History:  Diagnosis Date   Allergy    Anxiety    Bartholin gland cyst 2008   COVID 09/12/2020   Depression    Hyperlipidemia     Tobacco History: Social History   Tobacco Use  Smoking Status Every Day   Packs/day: 1.00   Years: 20.00   Total pack years: 20.00   Types: Cigarettes  Smokeless Tobacco Never   Ready to quit: Not Answered Counseling given: Not Answered   Outpatient Encounter Medications as of 06/08/2022  Medication Sig   acetaminophen (TYLENOL) 500 MG tablet Take 1 tablet (500 mg total) by mouth every 6 (six) hours as needed.   aspirin 81 MG tablet Take 81 mg by mouth daily.   Cholecalciferol (VITAMIN D) 50 MCG (2000 UT) CAPS Take 1 capsule (2,000 Units total) by mouth daily.   lidocaine (XYLOCAINE) 2 % solution Apply to affected area as needed for pain   Multiple Vitamin (MULTIVITAMIN) tablet Take 1 tablet by mouth daily.   naproxen (NAPROSYN) 375 MG tablet Take 1 tablet (375 mg  total) by mouth 2 (two) times daily.   [DISCONTINUED] atorvastatin (LIPITOR) 20 MG tablet TAKE 1 TABLET (20 MG TOTAL) BY MOUTH DAILY.   [DISCONTINUED] cetirizine (ZYRTEC) 10 MG tablet Take 1 tablet (10 mg total) by mouth daily.   [DISCONTINUED] fluticasone (FLONASE) 50 MCG/ACT nasal spray Place 2 sprays into both nostrils daily.   [DISCONTINUED] meclizine (ANTIVERT) 25 MG tablet Take 1 tablet (25 mg total) by mouth 3 (three) times daily as needed for dizziness.   atorvastatin (LIPITOR) 20 MG tablet TAKE 1 TABLET (20 MG TOTAL) BY MOUTH DAILY.   benzonatate (TESSALON) 100 MG capsule Take 1 capsule (100 mg total) by mouth 3 (three) times daily as needed. (Patient not taking: Reported on 06/08/2022)   cetirizine (ZYRTEC) 10 MG tablet Take 1 tablet (10 mg total) by mouth daily.   fluticasone (FLONASE) 50 MCG/ACT nasal spray Place 2 sprays into both nostrils daily.   meclizine (ANTIVERT) 25 MG tablet Take 1 tablet (25 mg total) by mouth 3 (three) times daily as needed for dizziness.   sertraline (ZOLOFT) 50 MG tablet Take 1 tablet (50 mg total) by mouth daily.   sulfamethoxazole-trimethoprim (BACTRIM DS) 800-160 MG tablet Take 1 tablet by mouth 2 (two) times daily. (Patient not taking: Reported on 06/08/2022)   [DISCONTINUED] sertraline (ZOLOFT) 50 MG tablet Take 1 tablet (50 mg total) by mouth daily.   Facility-Administered Encounter Medications  as of 06/08/2022  Medication   lidocaine (XYLOCAINE) 0.5 % (with pres) injection 5 mL     Review of Systems  Review of Systems  Constitutional: Negative.   HENT: Negative.    Cardiovascular: Negative.   Gastrointestinal: Negative.   Allergic/Immunologic: Negative.   Neurological: Negative.   Psychiatric/Behavioral: Negative.         Physical Exam  BP 133/75 (BP Location: Left Arm, Patient Position: Sitting, Cuff Size: Normal)   Pulse 68   Ht 5' (1.524 m)   Wt 123 lb (55.8 kg)   LMP 09/20/2013 (LMP Unknown)   SpO2 98%   BMI 24.02 kg/m   Wt  Readings from Last 5 Encounters:  06/08/22 123 lb (55.8 kg)  10/26/21 124 lb (56.2 kg)  09/27/21 126 lb (57.2 kg)  09/14/21 124 lb 11.2 oz (56.6 kg)  08/24/21 124 lb 9.6 oz (56.5 kg)     Physical Exam Vitals and nursing note reviewed.  Constitutional:      General: She is not in acute distress.    Appearance: She is well-developed.  Cardiovascular:     Rate and Rhythm: Normal rate and regular rhythm.  Pulmonary:     Effort: Pulmonary effort is normal.     Breath sounds: Normal breath sounds.  Neurological:     Mental Status: She is alert and oriented to person, place, and time.      Lab Results:  CBC    Component Value Date/Time   WBC 9.4 05/15/2021 1003   WBC 8.1 09/19/2015 1204   RBC 4.82 05/15/2021 1003   RBC 4.71 09/19/2015 1204   HGB 15.6 05/15/2021 1003   HCT 44.6 05/15/2021 1003   PLT 322 05/15/2021 1003   MCV 93 05/15/2021 1003   MCH 32.4 05/15/2021 1003   MCH 31.6 09/19/2015 1204   MCHC 35.0 05/15/2021 1003   MCHC 34.0 09/19/2015 1204   RDW 11.7 05/15/2021 1003   LYMPHSABS 2.0 05/15/2021 1003   MONOABS 0.5 09/19/2015 1204   EOSABS 0.2 05/15/2021 1003   BASOSABS 0.0 05/15/2021 1003    BMET    Component Value Date/Time   NA 142 05/15/2021 1003   K 4.2 05/15/2021 1003   CL 106 05/15/2021 1003   CO2 17 (L) 06/08/2019 0959   GLUCOSE 94 05/15/2021 1003   GLUCOSE 109 (H) 08/27/2015 0632   BUN 9 05/15/2021 1003   CREATININE 0.72 05/15/2021 1003   CALCIUM 9.7 05/15/2021 1003   GFRNONAA 101 01/28/2020 0853   GFRAA 116 01/28/2020 0853     Assessment & Plan:   1. Vertigo  - meclizine (ANTIVERT) 25 MG tablet; Take 1 tablet (25 mg total) by mouth 3 (three) times daily as needed for dizziness.  Dispense: 30 tablet; Refill: 0  2. Aortoiliac occlusive disease (HCC)  - atorvastatin (LIPITOR) 20 MG tablet; TAKE 1 TABLET (20 MG TOTAL) BY MOUTH DAILY.  Dispense: 30 tablet; Refill: 6  3. Allergic rhinitis due to pollen, unspecified seasonality  -  cetirizine (ZYRTEC) 10 MG tablet; Take 1 tablet (10 mg total) by mouth daily.  Dispense: 30 tablet; Refill: 11  4. Disorder of right eustachian tube  - fluticasone (FLONASE) 50 MCG/ACT nasal spray; Place 2 sprays into both nostrils daily.  Dispense: 16 g; Refill: 6  5. Generalized anxiety disorder  - sertraline (ZOLOFT) 50 MG tablet; Take 1 tablet (50 mg total) by mouth daily.  Dispense: 90 tablet; Refill: 1  6. Mixed hyperlipidemia  - CBC - Comprehensive metabolic panel - Lipid  Panel  Follow up:  Follow up in 1 year    Fenton Foy, NP 06/08/2022

## 2022-06-08 NOTE — Telephone Encounter (Signed)
Pt visited [facility] to schd appt to renew orange card. Appt schd on 06/12/2022 at 10 AM.

## 2022-06-09 LAB — COMPREHENSIVE METABOLIC PANEL
ALT: 14 IU/L (ref 0–32)
AST: 25 IU/L (ref 0–40)
Albumin/Globulin Ratio: 1.9 (ref 1.2–2.2)
Albumin: 4.5 g/dL (ref 3.8–4.9)
Alkaline Phosphatase: 97 IU/L (ref 44–121)
BUN/Creatinine Ratio: 10 (ref 9–23)
BUN: 7 mg/dL (ref 6–24)
Bilirubin Total: 0.3 mg/dL (ref 0.0–1.2)
CO2: 25 mmol/L (ref 20–29)
Calcium: 9.6 mg/dL (ref 8.7–10.2)
Chloride: 106 mmol/L (ref 96–106)
Creatinine, Ser: 0.69 mg/dL (ref 0.57–1.00)
Globulin, Total: 2.4 g/dL (ref 1.5–4.5)
Glucose: 86 mg/dL (ref 70–99)
Potassium: 4.3 mmol/L (ref 3.5–5.2)
Sodium: 142 mmol/L (ref 134–144)
Total Protein: 6.9 g/dL (ref 6.0–8.5)
eGFR: 102 mL/min/{1.73_m2} (ref >59)

## 2022-06-09 LAB — CBC
Hematocrit: 44.3 % (ref 34.0–46.6)
Hemoglobin: 14.9 g/dL (ref 11.1–15.9)
MCH: 31.8 pg (ref 26.6–33.0)
MCHC: 33.6 g/dL (ref 31.5–35.7)
MCV: 95 fL (ref 79–97)
Platelets: 331 10*3/uL (ref 150–450)
RBC: 4.68 x10E6/uL (ref 3.77–5.28)
RDW: 11.8 % (ref 11.7–15.4)
WBC: 7.7 10*3/uL (ref 3.4–10.8)

## 2022-06-09 LAB — LIPID PANEL
Chol/HDL Ratio: 2.5 ratio (ref 0.0–4.4)
Cholesterol, Total: 117 mg/dL (ref 100–199)
HDL: 47 mg/dL (ref >39)
LDL Chol Calc (NIH): 53 mg/dL (ref 0–99)
Triglycerides: 85 mg/dL (ref 0–149)
VLDL Cholesterol Cal: 17 mg/dL (ref 5–40)

## 2022-06-19 ENCOUNTER — Ambulatory Visit: Payer: Self-pay | Attending: Nurse Practitioner

## 2022-07-05 ENCOUNTER — Other Ambulatory Visit: Payer: Self-pay

## 2022-08-06 ENCOUNTER — Other Ambulatory Visit: Payer: Self-pay

## 2022-08-07 ENCOUNTER — Other Ambulatory Visit: Payer: Self-pay

## 2022-08-08 ENCOUNTER — Other Ambulatory Visit: Payer: Self-pay

## 2022-08-21 DIAGNOSIS — Z1231 Encounter for screening mammogram for malignant neoplasm of breast: Secondary | ICD-10-CM

## 2022-08-28 ENCOUNTER — Telehealth: Payer: Self-pay | Admitting: Family Medicine

## 2022-08-28 NOTE — Telephone Encounter (Signed)
Patient is requesting a referral to get a mammogram

## 2022-08-30 ENCOUNTER — Other Ambulatory Visit: Payer: Self-pay

## 2022-08-30 DIAGNOSIS — Z1231 Encounter for screening mammogram for malignant neoplasm of breast: Secondary | ICD-10-CM

## 2022-08-31 ENCOUNTER — Other Ambulatory Visit: Payer: Self-pay

## 2022-09-03 ENCOUNTER — Other Ambulatory Visit: Payer: Self-pay | Admitting: Obstetrics and Gynecology

## 2022-09-03 DIAGNOSIS — Z1231 Encounter for screening mammogram for malignant neoplasm of breast: Secondary | ICD-10-CM

## 2022-09-25 ENCOUNTER — Ambulatory Visit
Admission: RE | Admit: 2022-09-25 | Discharge: 2022-09-25 | Disposition: A | Discharge status: Home / Self Care | Payer: No Typology Code available for payment source | Source: Ambulatory Visit | Attending: Obstetrics and Gynecology | Admitting: Obstetrics and Gynecology

## 2022-09-25 ENCOUNTER — Ambulatory Visit: Payer: Self-pay | Admitting: *Deleted

## 2022-09-25 VITALS — BP 120/65 | Wt 126.0 lb

## 2022-09-25 DIAGNOSIS — Z1231 Encounter for screening mammogram for malignant neoplasm of breast: Secondary | ICD-10-CM

## 2022-09-25 DIAGNOSIS — Z1239 Encounter for other screening for malignant neoplasm of breast: Secondary | ICD-10-CM

## 2022-09-25 NOTE — Patient Instructions (Signed)
Explained breast self awareness with [name]. Patient did not need a Pap smear today due to last Pap smear and HPV typing was 11/28/2021. Let her know BCCCP will cover Pap smears and HPV typing every 5 years unless has a history of abnormal Pap smears. Referred patient to the [name] for a screening mammogram on mobile unit. Appointment scheduled Tuesday, September 25, 2022 at 1500. Patient aware of appointment and will be there. Let patient know the [facility] will follow up with her within the next couple weeks with results of her mammogram by letter or phone. Brandi Molina verbalized understanding.  Tierria Watson, Arvil Chaco, RN 2:54 PM

## 2022-09-25 NOTE — Progress Notes (Signed)
[name] is a 57 y.o. female who presents to [facility] clinic today with no complaints.    Pap Smear: Pap smear not completed today. Last Pap smear was 11/28/2021 at the free cervical cancer screening and normal with negative HPV. Patients previous Pap smear was 06/30/2018 at [facility] and was normal with negative HPV. Per patient has a history of an abnormal Pap smear over 28 years ago that cryotherapy was completed for follow-up. Patient stated she has had at least three normal Pap smears since the cryotherapy was completed. Last Pap smear result is available in Epic.    Physical exam: Breasts Breasts symmetrical. No skin abnormalities bilateral breasts. No nipple retraction bilateral breasts. No nipple discharge bilateral breasts. No lymphadenopathy. No lumps palpated bilateral breasts. No complaints of pain or tenderness on exam.    MM 3D SCREEN BREAST BILATERAL  Result Date: 07/05/2021 CLINICAL DATA:  Screening. EXAM: DIGITAL SCREENING BILATERAL MAMMOGRAM WITH TOMOSYNTHESIS AND CAD TECHNIQUE: Bilateral screening digital craniocaudal and mediolateral oblique mammograms were obtained. Bilateral screening digital breast tomosynthesis was performed. The images were evaluated with computer-aided detection. COMPARISON:  Previous exam(s). ACR Breast Density Category c: The breast tissue is heterogeneously dense, which may obscure small masses. FINDINGS: There are no findings suspicious for malignancy. IMPRESSION: No mammographic evidence of malignancy. A result letter of this screening mammogram will be mailed directly to the patient. RECOMMENDATION: Screening mammogram in one year. (Code:SM-B-01Y) BI-RADS CATEGORY  1: Negative. Electronically Signed   By: Abelardo Diesel M.D.   On: 07/05/2021 11:15   MS DIGITAL SCREENING TOMO BILATERAL  Result Date: 10/20/2019 CLINICAL DATA:  Screening. EXAM: DIGITAL SCREENING BILATERAL MAMMOGRAM WITH TOMO AND CAD COMPARISON:   Previous exam(s). ACR Breast Density Category c: The breast tissue is heterogeneously dense, which may obscure small masses. FINDINGS: There are no findings suspicious for malignancy. Images were processed with CAD. IMPRESSION: No mammographic evidence of malignancy. A result letter of this screening mammogram will be mailed directly to the patient. RECOMMENDATION: Screening mammogram in one year. (Code:SM-B-01Y) BI-RADS CATEGORY  1: Negative. Electronically Signed   By: Audie Pinto M.D.   On: 10/20/2019 12:08   MM LT BREAST BX W LOC DEV 1ST LESION IMAGE BX SPEC STEREO GUIDE  Addendum Date: 10/01/2018   ADDENDUM REPORT: 10/01/2018 11:26 ADDENDUM: Pathology revealed FIBROCYSTIC CHANGES WITH CALCIFICATIONS of the Left breast, superior central. This was found to be concordant by Dr. Kristopher Oppenheim. Pathology results were discussed with the patient by telephone. The patient reported doing well after the biopsy with tenderness at the site. Post biopsy instructions and care were reviewed and questions were answered. The patient was encouraged to call [facility] for any additional concerns. The patient was instructed to return for annual screening mammography and informed a reminder notice would be sent regarding this appointment. Pathology results reported by Terie Purser, RN on 10/01/2018. Electronically Signed   By: Kristopher Oppenheim M.D.   On: 10/01/2018 11:26   Result Date: 10/01/2018 CLINICAL DATA:  57 year old female with indeterminate left breast calcifications. EXAM: LEFT BREAST STEREOTACTIC CORE NEEDLE BIOPSY COMPARISON:  Previous exams. FINDINGS: The patient and I discussed the procedure of stereotactic-guided biopsy including benefits and alternatives. We discussed the high likelihood of a successful procedure. We discussed the risks of the procedure including infection, bleeding, tissue injury, clip migration, and inadequate sampling. Informed written consent was given. The  usual time out protocol was performed immediately prior to the procedure.  Using sterile technique and 1% Lidocaine as local anesthetic, under stereotactic guidance, a 9 gauge vacuum assisted device was used to perform core needle biopsy of calcifications in the upper left breast using a superior approach. Specimen radiograph was performed showing calcifications in several specimens. Specimens with calcifications are identified for pathology. Lesion quadrant: Upper outer quadrant At the conclusion of the procedure, a coil shaped tissue marker clip was deployed into the biopsy cavity. Follow-up 2-view mammogram was performed and dictated separately. IMPRESSION: Stereotactic-guided biopsy of left breast calcifications. No apparent complications. Electronically Signed: By: Kristopher Oppenheim M.D. On: 09/30/2018 09:52   MM CLIP PLACEMENT LEFT  Result Date: 09/30/2018 CLINICAL DATA:  57 year old female status post stereotactic biopsy of the left breast. EXAM: DIAGNOSTIC LEFT MAMMOGRAM POST STEREOTACTIC BIOPSY COMPARISON:  Previous exam(s). FINDINGS: Mammographic images were obtained following stereotactic guided biopsy of left breast calcifications. Coil shaped clip is identified in the superior central aspect at middle depth. There is approximately 1 cm inferior migration of the clip from the site of biopsied calcifications. IMPRESSION: Approximately 1 cm inferior migration of the clip from the site of biopsied calcifications. Final Assessment: Post Procedure Mammograms for Marker Placement Electronically Signed   By: Kristopher Oppenheim M.D.   On: 09/30/2018 09:58   MM Digital Diagnostic Unilat L  Result Date: 09/25/2018 CLINICAL DATA:  The patient returns after screening study for evaluation of LEFT breast calcifications. EXAM: DIGITAL DIAGNOSTIC LEFT MAMMOGRAM WITH CAD COMPARISON:  09/18/2018 and earlier ACR Breast Density Category c: The breast tissue is heterogeneously dense, which may obscure small masses. FINDINGS:  Magnified views are performed of calcifications in the UPPER central portion of the LEFT breast. On magnified views there is a group of fine pleomorphic calcifications with a linear distribution in the UPPER central aspect of the LEFT breast. The group of calcifications measures 0.9 x 0.3 centimeters. Mammographic images were processed with CAD. IMPRESSION: Indeterminate LEFT breast calcifications. RECOMMENDATION: Stereotactic guided core biopsy of LEFT breast calcifications. I have discussed the findings and recommendations with the patient. Results were also provided in writing at the conclusion of the visit. If applicable, a reminder letter will be sent to the patient regarding the next appointment. BI-RADS CATEGORY  4: Suspicious. Electronically Signed   By: Nolon Nations M.D.   On: 09/25/2018 13:27   MS DIGITAL SCREENING TOMO BILATERAL  Result Date: 09/18/2018 CLINICAL DATA:  Screening. EXAM: DIGITAL SCREENING BILATERAL MAMMOGRAM WITH TOMO AND CAD COMPARISON:  Previous exam(s). ACR Breast Density Category c: The breast tissue is heterogeneously dense, which may obscure small masses. FINDINGS: In the left breast, calcifications warrant further evaluation. In the right breast, no findings suspicious for malignancy. Images were processed with CAD. IMPRESSION: Further evaluation is suggested for calcifications in the left breast. RECOMMENDATION: Diagnostic mammogram of the left breast. (Code:FI-L-38M) The patient will be contacted regarding the findings, and additional imaging will be scheduled. BI-RADS CATEGORY  0: Incomplete. Need additional imaging evaluation and/or prior mammograms for comparison. Electronically Signed   By: Everlean Alstrom M.D.   On: 09/18/2018 15:33     Pelvic/Bimanual Pap is not indicated today per BCCCP guidelines.   Smoking History: Patient is a current smoker. Discussed smoking cessation with patient. Referred to the [facility] Quitline and gave resources to the free smoking  cessation classes at [facility].    Patient Navigation: Patient education provided. Access to services provided for patient through [facility] program.   Colorectal Cancer Screening: Patient had a colonoscopy completed 06/27/2018. No complaints today. Marland Kitchen  Breast and Cervical Cancer Risk Assessment: Patient has family history of a great maternal aunt having breast cancer. Patient has no known genetic mutations or history of radiation treatment to the chest before age 18. Per patient has a history of cervical dysplasia. Patient has no history of being immunocompromised or DES exposure in-utero.   Risk Scores as of 09/25/2022     Brandi Molina           5-year 2.01 %   Lifetime 12.79 %            Last calculated by Claretha Cooper, CMA on 09/25/2022 at  2:49 PM        A: BCCCP exam without pap smear No complaints.  P: Referred patient to the [facility] for a screening mammogram on mobile unit. Appointment scheduled Tuesday, September 25, 2022 at 1500.    Loletta Parish, RN 09/25/2022 2:54 PM

## 2022-10-04 ENCOUNTER — Other Ambulatory Visit: Payer: Self-pay

## 2022-10-29 ENCOUNTER — Other Ambulatory Visit: Payer: Self-pay

## 2022-10-30 ENCOUNTER — Other Ambulatory Visit: Payer: Self-pay

## 2023-01-01 ENCOUNTER — Other Ambulatory Visit: Payer: Self-pay

## 2023-01-01 ENCOUNTER — Other Ambulatory Visit: Payer: Self-pay | Admitting: Nurse Practitioner

## 2023-01-01 DIAGNOSIS — I7409 Other arterial embolism and thrombosis of abdominal aorta: Secondary | ICD-10-CM

## 2023-01-01 MED ORDER — ATORVASTATIN CALCIUM 20 MG PO TABS
20.0000 mg | ORAL_TABLET | Freq: Every day | ORAL | 6 refills | Status: DC
  Filled 2023-01-01: qty 30, 30d supply, fill #0
  Filled 2023-02-08 (×2): qty 30, 30d supply, fill #1
  Filled 2023-03-08: qty 30, 30d supply, fill #2
  Filled 2023-04-05: qty 30, 30d supply, fill #3
  Filled 2023-05-05: qty 30, 30d supply, fill #4
  Filled 2023-06-07: qty 30, 30d supply, fill #5
  Filled 2023-07-08: qty 30, 30d supply, fill #6

## 2023-02-08 ENCOUNTER — Other Ambulatory Visit: Payer: Self-pay | Admitting: Nurse Practitioner

## 2023-02-08 ENCOUNTER — Other Ambulatory Visit: Payer: Self-pay

## 2023-02-08 DIAGNOSIS — F411 Generalized anxiety disorder: Secondary | ICD-10-CM

## 2023-02-11 ENCOUNTER — Other Ambulatory Visit: Payer: Self-pay

## 2023-02-11 MED ORDER — SERTRALINE HCL 50 MG PO TABS
50.0000 mg | ORAL_TABLET | Freq: Every day | ORAL | 1 refills | Status: DC
  Filled 2023-02-11: qty 90, 90d supply, fill #0
  Filled 2023-03-08 – 2023-06-07 (×2): qty 90, 90d supply, fill #1

## 2023-02-11 NOTE — Telephone Encounter (Signed)
Pleas advise [facility] 

## 2023-02-21 DIAGNOSIS — H353131 Nonexudative age-related macular degeneration, bilateral, early dry stage: Secondary | ICD-10-CM | POA: Diagnosis not present

## 2023-02-21 DIAGNOSIS — H2521 Age-related cataract, morgagnian type, right eye: Secondary | ICD-10-CM | POA: Diagnosis not present

## 2023-02-21 DIAGNOSIS — H25042 Posterior subcapsular polar age-related cataract, left eye: Secondary | ICD-10-CM | POA: Diagnosis not present

## 2023-02-21 DIAGNOSIS — H5213 Myopia, bilateral: Secondary | ICD-10-CM | POA: Diagnosis not present

## 2023-03-08 ENCOUNTER — Other Ambulatory Visit: Payer: Self-pay

## 2023-04-09 ENCOUNTER — Other Ambulatory Visit: Payer: Self-pay

## 2023-04-22 DIAGNOSIS — Z961 Presence of intraocular lens: Secondary | ICD-10-CM | POA: Diagnosis not present

## 2023-04-22 DIAGNOSIS — H25811 Combined forms of age-related cataract, right eye: Secondary | ICD-10-CM | POA: Diagnosis not present

## 2023-04-22 DIAGNOSIS — H2511 Age-related nuclear cataract, right eye: Secondary | ICD-10-CM | POA: Diagnosis not present

## 2023-05-06 ENCOUNTER — Other Ambulatory Visit: Payer: Self-pay

## 2023-05-06 DIAGNOSIS — H25812 Combined forms of age-related cataract, left eye: Secondary | ICD-10-CM | POA: Diagnosis not present

## 2023-05-06 DIAGNOSIS — Z961 Presence of intraocular lens: Secondary | ICD-10-CM | POA: Diagnosis not present

## 2023-05-06 DIAGNOSIS — H2512 Age-related nuclear cataract, left eye: Secondary | ICD-10-CM | POA: Diagnosis not present

## 2023-05-16 ENCOUNTER — Encounter (HOSPITAL_COMMUNITY): Payer: Self-pay

## 2023-05-16 ENCOUNTER — Ambulatory Visit (HOSPITAL_COMMUNITY)
Admission: EM | Admit: 2023-05-16 | Discharge: 2023-05-16 | Disposition: A | Discharge status: Home / Self Care | Payer: Medicaid Other | Attending: Family Medicine | Admitting: Family Medicine

## 2023-05-16 DIAGNOSIS — R002 Palpitations: Secondary | ICD-10-CM | POA: Diagnosis not present

## 2023-05-16 LAB — BASIC METABOLIC PANEL
Anion gap: 10 (ref 5–15)
BUN: 7 mg/dL (ref 6–20)
CO2: 27 mmol/L (ref 22–32)
Calcium: 10.2 mg/dL (ref 8.9–10.3)
Chloride: 105 mmol/L (ref 98–111)
Creatinine, Ser: 0.74 mg/dL (ref 0.44–1.00)
GFR, Estimated: >60 mL/min (ref >60)
Glucose, Bld: 98 mg/dL (ref 70–99)
Potassium: 3.8 mmol/L (ref 3.5–5.1)
Sodium: 142 mmol/L (ref 135–145)

## 2023-05-16 LAB — CBC
HCT: 43.4 % (ref 36.0–46.0)
Hemoglobin: 14.8 g/dL (ref 12.0–15.0)
MCH: 32.2 pg (ref 26.0–34.0)
MCHC: 34.1 g/dL (ref 30.0–36.0)
MCV: 94.3 fL (ref 80.0–100.0)
Platelets: 293 10*3/uL (ref 150–400)
RBC: 4.6 MIL/uL (ref 3.87–5.11)
RDW: 12.1 % (ref 11.5–15.5)
WBC: 8.7 10*3/uL (ref 4.0–10.5)
nRBC: 0 % (ref 0.0–0.2)

## 2023-05-16 LAB — TSH: TSH: 0.803 u[IU]/mL (ref 0.350–4.500)

## 2023-05-16 NOTE — ED Provider Notes (Signed)
[facility]   [phone] 05/16/23 Arrival Time: 1517  ASSESSMENT & PLAN:  1. Palpitations    Denies CP/SOB. Cardiology referral placed. Orders Placed This Encounter  Procedures   Ambulatory referral to Cardiology   ECG: Performed today and interpreted by me: normal EKG, normal sinus rhythm.  Pending:  BASIC METABOLIC PANEL  CBC  TSH     Discharge Instructions      You have had labs (blood tests) sent today. We will call you with any significant abnormalities or if there is need to begin or change treatment or pursue further follow up.  You may also review your test results online through MyChart. If you do not have a MyChart account, instructions to sign up should be on your discharge paperwork.      Reviewed expectations re: course of current medical issues. Questions answered. Outlined signs and symptoms indicating need for more acute intervention. Patient verbalized understanding. After Visit Summary given.   SUBJECTIVE:  History from: patient. Brandi Molina is a 57 y.o. female who presents with complaint that her BP machine read that her heart rhythm was abnormal today. Denies weakness, dizziness, blurred vision, or headaches. No sensations of her heart racing, but does feel like it is skipping beats. Onset yesterday. Denies CP/SOB.  Social History   Tobacco Use  Smoking Status Every Day   Current packs/day: 1.00   Average packs/day: 1 pack/day for 40.0 years (40.0 ttl pk-yrs)   Types: Cigarettes  Smokeless Tobacco Never   Social History   Substance and Sexual Activity  Alcohol Use Not Currently   Comment: occ  Denies recreational drug use.  OBJECTIVE:  Vitals:   05/16/23 1611 05/16/23 1612  BP:  119/66  Pulse:  81  Resp:  18  Temp:  97.9 F (36.6 C)  TempSrc:  Oral  SpO2:  97%  Weight: 56.7 kg   Height: 5' (1.524 m)     General appearance: alert, oriented, no acute distress Eyes: PERRLA; EOMI; conjunctivae  normal HENT: normocephalic; atraumatic Neck: supple with FROM Lungs: without labored respirations; speaks full sentences without difficulty; CTAB Heart: regular rate and rhythm without murmer Chest Wall: without tenderness to palpation Abdomen: soft, non-tender; no guarding or rebound tenderness Extremities: without edema; without calf swelling or tenderness; symmetrical without gross deformities Skin: warm and dry; without rash or lesions Neuro: normal gait Psychological: alert and cooperative; normal mood and affect   No Known Allergies  Past Medical History:  Diagnosis Date   Allergy    Anxiety    Bartholin gland cyst 2008   COVID 09/12/2020   Depression    Hyperlipidemia    Social History   Socioeconomic History   Marital status: Widowed    Spouse name: Not on file   Number of children: 1   Years of education: Not on file   Highest education level: 12th grade  Occupational History   Not on file  Tobacco Use   Smoking status: Every Day    Current packs/day: 1.00    Average packs/day: 1 pack/day for 40.0 years (40.0 ttl pk-yrs)    Types: Cigarettes   Smokeless tobacco: Never  Vaping Use   Vaping status: Never Used  Substance and Sexual Activity   Alcohol use: Not Currently    Comment: occ   Drug use: No   Sexual activity: Yes  Other Topics Concern   Not on file  Social History Narrative   Not on file   Social Determinants of Health  Financial Resource Strain: Not on file  Food Insecurity: No Food Insecurity (09/25/2022)   Hunger Vital Sign    Worried About Running Out of Food in the Last Year: Never true    Ran Out of Food in the Last Year: Never true  Transportation Needs: No Transportation Needs (09/25/2022)   PRAPARE - Administrator, Civil Service (Medical): No    Lack of Transportation (Non-Medical): No  Physical Activity: Not on file  Stress: Not on file  Social Connections: Not on file  Intimate Partner Violence: Not on file    Family History  Problem Relation Age of Onset   Diabetes Mother    Heart disease Mother    Heart attack Mother    AAA (abdominal aortic aneurysm) Mother    COPD Mother    Cancer Father        lung   Hypertension Brother    Cancer - Other Brother        pallate cancer   Diabetes Brother    Anesthesia problems Neg Hx    Hypotension Neg Hx    Malignant hyperthermia Neg Hx    Pseudochol deficiency Neg Hx    Colon cancer Neg Hx    Stomach cancer Neg Hx    Rectal cancer Neg Hx    Esophageal cancer Neg Hx    Breast cancer Neg Hx    Past Surgical History:  Procedure Laterality Date   BREAST BIOPSY Left 2020   CATARACT EXTRACTION Bilateral    CHOLECYSTECTOMY  1999      Mardella Layman, MD 05/16/23 1815

## 2023-05-16 NOTE — Discharge Instructions (Addendum)
You have had labs (blood tests) sent today. We will call you with any significant abnormalities or if there is need to begin or change treatment or pursue further follow up.  You may also review your test results online through MyChart. If you do not have a MyChart account, instructions to sign up should be on your discharge paperwork.  

## 2023-05-16 NOTE — ED Triage Notes (Signed)
Patient states her BP machine is telling her that her heart rhythm is abnormal. No weakness, dizziness, blurred vision, or headaches. No sensations of her heart racing, but does feel like it is skipping beats. Onset yesterday.

## 2023-05-29 NOTE — Progress Notes (Unsigned)
Cardiology Office Note:    Date:  05/30/2023  NAME:  Brandi Molina    MRN: [id number] DOB:  [birth year]   PCP:  Ivonne Andrew, NP  Former Cardiology Providers: None Primary Cardiologist:  Tessa Lerner, DO, [facility] (established care 05/30/2023) Electrophysiologist:  None   Referring MD: Mardella Layman, MD  Reason of Consult: Palpitations.  Chief Complaint  Patient presents with   Palpitations   New Patient (Initial Visit)    History of Present Illness:    Brandi Molina is a 57 y.o. Caucasian female whose past medical history and cardiovascular risk factors includes: Cigarette smoker, mixed hyperlipidemia, Aortoiliac occlusive disease, family history of premature CAD. She is being seen today for the evaluation of palpitations at the request of Mardella Layman, MD.  Patient was referred to the practice for evaluation and management of palpitations.  Palpitations: Ongoing for the last year. No change in intensity frequency or duration. Occurs randomly. Lasting for minutes. Worse with increased caffeine intake. Better with drinking water. No near-syncope or syncopal events. Recently has been checking her blood pressures at home and the machine has showed/alerted her of " abnormal heart rhythm."  She went to urgent care for further evaluation and was referred to cardiology.  Family history of premature CAD-mother had a PCI at the age of 8.  Factors to consider: History of thyroid disease: None History of anemia: None Coffee consumption: None Caffeinated beverages/sodas: 16 oz sodas - 6 bottle / day  Energy drinks: None Stimulants: None Illicit drugs: None  Weight loss supplements: None New over-the-counter medications: None Herbal supplements: None Any prior workup: None  Current Medications: Current Meds  Medication Sig   acetaminophen (TYLENOL) 500 MG tablet Take 1 tablet (500 mg total) by mouth every 6 (six) hours as needed.   aspirin 81 MG tablet  Take 81 mg by mouth daily.   atorvastatin (LIPITOR) 20 MG tablet Take 1 tablet (20 mg total) by mouth daily.   cetirizine (ZYRTEC) 10 MG tablet Take 1 tablet (10 mg total) by mouth daily.   Cholecalciferol (VITAMIN D) 50 MCG (2000 UT) CAPS Take 1 capsule (2,000 Units total) by mouth daily.   fluticasone (FLONASE) 50 MCG/ACT nasal spray Place 2 sprays into both nostrils daily.   lidocaine (XYLOCAINE) 2 % solution Apply to affected area as needed for pain   meclizine (ANTIVERT) 25 MG tablet Take 1 tablet (25 mg total) by mouth 3 (three) times daily as needed for dizziness.   Multiple Vitamin (MULTIVITAMIN) tablet Take 1 tablet by mouth daily.   naproxen (NAPROSYN) 375 MG tablet Take 1 tablet (375 mg total) by mouth 2 (two) times daily.   sertraline (ZOLOFT) 50 MG tablet Take 1 tablet (50 mg total) by mouth daily.   Current Facility-Administered Medications for the 05/30/23 encounter (Office Visit) with Tessa Lerner, DO  Medication   lidocaine (XYLOCAINE) 0.5 % (with pres) injection 5 mL     Allergies:    Patient has no known allergies.   Past Medical History: Past Medical History:  Diagnosis Date   Allergy    Anxiety    Bartholin gland cyst 2008   COVID 09/12/2020   Depression    Hyperlipidemia     Past Surgical History: Past Surgical History:  Procedure Laterality Date   BREAST BIOPSY Left 2020   CATARACT EXTRACTION Bilateral    CHOLECYSTECTOMY  1999    Social History: Social History   Tobacco Use   Smoking status: Every Day  Current packs/day: 1.00    Average packs/day: 1 pack/day for 40.0 years (40.0 ttl pk-yrs)    Types: Cigarettes   Smokeless tobacco: Never  Vaping Use   Vaping status: Never Used  Substance Use Topics   Alcohol use: Not Currently    Comment: occ   Drug use: No    Family History: Family History  Problem Relation Age of Onset   Diabetes Mother    Heart disease Mother    Heart attack Mother    AAA (abdominal aortic aneurysm) Mother     COPD Mother    Cancer Father        lung   Hypertension Brother    Cancer - Other Brother        pallate cancer   Diabetes Brother    Anesthesia problems Neg Hx    Hypotension Neg Hx    Malignant hyperthermia Neg Hx    Pseudochol deficiency Neg Hx    Colon cancer Neg Hx    Stomach cancer Neg Hx    Rectal cancer Neg Hx    Esophageal cancer Neg Hx    Breast cancer Neg Hx     ROS:   Review of Systems  Cardiovascular:  Positive for palpitations. Negative for chest pain, claudication, dyspnea on exertion, irregular heartbeat, leg swelling, near-syncope, orthopnea, paroxysmal nocturnal dyspnea and syncope.  Respiratory:  Negative for shortness of breath.   Hematologic/Lymphatic: Negative for bleeding problem.  Musculoskeletal:  Negative for muscle cramps and myalgias.  Neurological:  Negative for dizziness and light-headedness.    EKGs/Labs/Other Studies Reviewed:   EKG Interpretation Date/Time:  Thursday May 30 2023 08:11:22 EDT Ventricular Rate:  87 PR Interval:  114 QRS Duration:  68 QT Interval:  356 QTC Calculation: 428 R Axis:   -49  Text Interpretation: Normal sinus rhythm Left axis deviation Left anterior fascicular block When compared with ECG of 16-May-2023 16:37, No significant change was found Confirmed by Tessa Lerner [phone]) on 05/30/2023 8:12:56 AM    RADIOLOGY: Lumbar Spine Xray 07/2014 Aortoiliac atherosclerotic vascular disease.  See report for additional details  Labs:    Latest Ref Rng & Units 05/16/2023    5:28 PM 06/08/2022   11:42 AM 05/15/2021   10:03 AM  CBC  WBC 4.0 - 10.5 K/uL 8.7  7.7  9.4   Hemoglobin 12.0 - 15.0 g/dL 19.1  47.8  29.5   Hematocrit 36.0 - 46.0 % 43.4  44.3  44.6   Platelets 150 - 400 K/uL 293  331  322        Latest Ref Rng & Units 05/16/2023    5:28 PM 06/08/2022   11:42 AM 05/15/2021   10:03 AM  BMP  Glucose 70 - 99 mg/dL 98  86  94   BUN 6 - 20 mg/dL 7  7  9    Creatinine 0.44 - 1.00 mg/dL 6.21  3.08  6.57    BUN/Creat Ratio 9 - 23  10  13    Sodium 135 - 145 mmol/L 142  142  142   Potassium 3.5 - 5.1 mmol/L 3.8  4.3  4.2   Chloride 98 - 111 mmol/L 105  106  106   CO2 22 - 32 mmol/L 27  25    Calcium 8.9 - 10.3 mg/dL 84.6  9.6  9.7       Latest Ref Rng & Units 05/16/2023    5:28 PM 06/08/2022   11:42 AM 05/15/2021   10:03 AM  CMP  Glucose  70 - 99 mg/dL 98  86  94   BUN 6 - 20 mg/dL 7  7  9    Creatinine 0.44 - 1.00 mg/dL 1.61  0.96  0.45   Sodium 135 - 145 mmol/L 142  142  142   Potassium 3.5 - 5.1 mmol/L 3.8  4.3  4.2   Chloride 98 - 111 mmol/L 105  106  106   CO2 22 - 32 mmol/L 27  25    Calcium 8.9 - 10.3 mg/dL 40.9  9.6  9.7   Total Protein 6.0 - 8.5 g/dL  6.9  7.3   Total Bilirubin 0.0 - 1.2 mg/dL  0.3  0.3   Alkaline Phos 44 - 121 IU/L  97  97   AST 0 - 40 IU/L  25  23   ALT 0 - 32 IU/L  14      Lab Results  Component Value Date   CHOL 117 06/08/2022   HDL 47 06/08/2022   LDLCALC 53 06/08/2022   TRIG 85 06/08/2022   CHOLHDL 2.5 06/08/2022   No results for input(s): "LIPOA" in the last 8760 hours. No components found for: "NTPROBNP" No results for input(s): "PROBNP" in the last 8760 hours. Recent Labs    05/16/23 1728  TSH 0.803    Physical Exam:    Today's Vitals   05/30/23 0813  BP: 108/70  Pulse: 87  Resp: 16  SpO2: 97%  Weight: 126 lb 12.8 oz (57.5 kg)  Height: 5' (1.524 m)   Body mass index is 24.76 kg/m. Wt Readings from Last 3 Encounters:  05/30/23 126 lb 12.8 oz (57.5 kg)  05/16/23 125 lb (56.7 kg)  09/25/22 126 lb (57.2 kg)    Physical Exam  Constitutional: No distress.  hemodynamically stable  Neck: No JVD present.  Cardiovascular: Normal rate, regular rhythm, S1 normal, S2 normal and intact distal pulses. Exam reveals no gallop, no S3 and no S4.  No murmur heard. Pulses:      Dorsalis pedis pulses are 2+ on the right side and 2+ on the left side.       Posterior tibial pulses are 1+ on the right side and 1+ on the left side.   Pulmonary/Chest: Effort normal and breath sounds normal. No stridor. She has no wheezes. She has no rales.  Abdominal: Soft. Bowel sounds are normal. She exhibits no distension. There is no abdominal tenderness.  Musculoskeletal:        General: No edema.     Cervical back: Neck supple.  Neurological: She is alert and oriented to person, place, and time. She has intact cranial nerves (2-12).  Skin: Skin is warm and moist.   Impression & Recommendation(s):  Impression:   ICD-10-CM   1. Palpitations  R00.2 EKG 12-Lead    LONG TERM MONITOR (3-14 DAYS)    2. Cigarette smoker  F17.210     3. Atherosclerosis of aorta (HCC)  I70.0        Recommendation(s):  Palpitations Ongoing for at least 1 year. Likely secondary to increased consumption of caffeinated beverages - 16 oz sodas - 6 bottle / day.  Encouraged her to decrease her consumption of caffeinated beverages for now. 2 weeks Zio patch to evaluate for dysrhythmias. Recent labs from October 2024 independently reviewed-TSH and hemoglobin within acceptable limits.  Cigarette smoker Smoking cigarettes since age of 60. Denies claudication. Consider checking ABIs to screen for PAD-will defer to PCP Also considered for lung cancer screening -will defer to  PCP Tobacco cessation counseling: Currently smoking 1 packs/day   She is informed of the dangers of tobacco abuse including stroke, cancer, and MI, as well as benefits of tobacco cessation. She is willing to quit at this time. 5 mins were spent counseling patient cessation techniques. We discussed various methods to help quit smoking, including deciding on a date to quit, joining a support group, pharmacological agents- nicotine gum/patch/lozenges.  I will reassess her progress at the next follow-up visit  Atherosclerosis of aorta (HCC) Already on aspirin and statin therapy. Reemphasized the importance of secondary prevention with focus on improving her modifiable cardiovascular  risk factors such as glycemic control, lipid management, blood pressure control. Currently denies any anginal chest pain or shortness of breath.  Will hold off on ischemic workup at this time.  Patient agreeable with the plan of care.  Orders Placed:  Orders Placed This Encounter  Procedures   LONG TERM MONITOR (3-14 DAYS)    Standing Status:   Future    Standing Expiration Date:   05/29/2024    Order Specific Question:   Where should this test be performed?    Answer:   [facility]    Order Specific Question:   Does the patient have an implanted cardiac device?    Answer:   No    Order Specific Question:   Prescribed days of wear    Answer:   75    Order Specific Question:   Type of enrollment    Answer:   Home Enrollment    Order Specific Question:   Vendor:    Answer:   [employer]   EKG 12-Lead    As part of medical decision making results of the urgent care documentation by Dr. Tracie Harrier, outside labs from October 2024, EKG were reviewed independently at today's visit.   Final Medication List:   No orders of the defined types were placed in this encounter.   There are no discontinued medications.   Current Outpatient Medications:    acetaminophen (TYLENOL) 500 MG tablet, Take 1 tablet (500 mg total) by mouth every 6 (six) hours as needed., Disp: 30 tablet, Rfl: 0   aspirin 81 MG tablet, Take 81 mg by mouth daily., Disp: , Rfl:    atorvastatin (LIPITOR) 20 MG tablet, Take 1 tablet (20 mg total) by mouth daily., Disp: 30 tablet, Rfl: 6   cetirizine (ZYRTEC) 10 MG tablet, Take 1 tablet (10 mg total) by mouth daily., Disp: 30 tablet, Rfl: 11   Cholecalciferol (VITAMIN D) 50 MCG (2000 UT) CAPS, Take 1 capsule (2,000 Units total) by mouth daily., Disp: 30 capsule, Rfl: 6   fluticasone (FLONASE) 50 MCG/ACT nasal spray, Place 2 sprays into both nostrils daily., Disp: 16 g, Rfl: 6   lidocaine (XYLOCAINE) 2 % solution, Apply to affected area as needed for pain, Disp: 30 mL, Rfl: 1   meclizine  (ANTIVERT) 25 MG tablet, Take 1 tablet (25 mg total) by mouth 3 (three) times daily as needed for dizziness., Disp: 30 tablet, Rfl: 0   Multiple Vitamin (MULTIVITAMIN) tablet, Take 1 tablet by mouth daily., Disp: , Rfl:    naproxen (NAPROSYN) 375 MG tablet, Take 1 tablet (375 mg total) by mouth 2 (two) times daily., Disp: 30 tablet, Rfl: 6   sertraline (ZOLOFT) 50 MG tablet, Take 1 tablet (50 mg total) by mouth daily., Disp: 90 tablet, Rfl: 1  Current Facility-Administered Medications:    lidocaine (XYLOCAINE) 0.5 % (with pres) injection 5 mL, 5 mL, Intradermal,  Once, Massie Maroon, FNP  Consent:   N/A  Disposition:   3 months with myself for reevaluation of symptoms. Patient may be asked to follow-up sooner based on the results of the above-mentioned testing.  Her questions and concerns were addressed to her satisfaction. She voices understanding of the recommendations provided during this encounter.    Signed, Tessa Lerner, DO, [facility]  [facility]  [address] [place], [place] [address] 05/30/2023 8:36 AM

## 2023-05-30 ENCOUNTER — Ambulatory Visit: Payer: Medicaid Other | Attending: Cardiology | Admitting: Cardiology

## 2023-05-30 ENCOUNTER — Ambulatory Visit (INDEPENDENT_AMBULATORY_CARE_PROVIDER_SITE_OTHER): Payer: Medicaid Other

## 2023-05-30 VITALS — BP 108/70 | HR 87 | Resp 16 | Ht 60.0 in | Wt 126.8 lb

## 2023-05-30 DIAGNOSIS — I700 Atherosclerosis of aorta: Secondary | ICD-10-CM

## 2023-05-30 DIAGNOSIS — R002 Palpitations: Secondary | ICD-10-CM

## 2023-05-30 DIAGNOSIS — F17210 Nicotine dependence, cigarettes, uncomplicated: Secondary | ICD-10-CM | POA: Diagnosis not present

## 2023-05-30 NOTE — Progress Notes (Unsigned)
Enrolled patient for a 14 day Zio XT  monitor to be mailed to patients home  °

## 2023-05-30 NOTE — Patient Instructions (Addendum)
Medication Instructions:  Your physician recommends that you continue on your current medications as directed. Please refer to the Current Medication list given to you today.  *If you need a refill on your cardiac medications before your next appointment, please call your pharmacy*  Lab Work: None ordered today. If you have labs (blood work) drawn today and your tests are completely normal, you will receive your results only by: MyChart Message (if you have MyChart) OR A paper copy in the mail If you have any lab test that is abnormal or we need to change your treatment, we will call you to review the results.  Testing/Procedures: Your physician has requested that you wear a Zio heart monitor for 14 days. This will be mailed to your home with instructions on how to apply the monitor and how to return it when finished. Please allow 2 weeks after returning the heart monitor before our office calls you with the results.  Follow-Up: At [facility], you and your health needs are our priority.  As part of our continuing mission to provide you with exceptional heart care, we have created designated Provider Care Teams.  These Care Teams include your primary Cardiologist (physician) and Advanced Practice Providers (APPs -  Physician Assistants and Nurse Practitioners) who all work together to provide you with the care you need, when you need it.  Your next appointment:   3 month(s)   The format for your next appointment:   In Person  Provider:   Tessa Lerner, DO {  Other Instructions ZIO XT- Long Term Monitor Instructions     Your physician has requested you wear a ZIO patch monitor for 14 days.  This is a single patch monitor. [employer] supplies one patch monitor per enrollment. Additional  stickers are not available. Please do not apply patch if you will be having a Nuclear Stress Test,  Echocardiogram, Cardiac CT, MRI, or Chest Xray during the period you would be wearing the  monitor.  The patch cannot be worn during these tests. You cannot remove and re-apply the  ZIO XT patch monitor.  Your ZIO patch monitor will be mailed 3 day USPS to your address on file. It may take 3-5 days  to receive your monitor after you have been enrolled.  Once you have received your monitor, please review the enclosed instructions. Your monitor  has already been registered assigning a specific monitor serial # to you.     Billing and Patient Assistance Program Information     We have supplied [employer] with any of your insurance information on file for billing purposes.  [employer] offers a sliding scale Patient Assistance Program for patients that do not have  insurance, or whose insurance does not completely cover the cost of the ZIO monitor.  You must apply for the Patient Assistance Program to qualify for this discounted rate.  To apply, please call [employer] at [phone], select option 4, select option 2, ask to apply for  Patient Assistance Program. Meredeth Ide will ask your household income, and how many people  are in your household. They will quote your out-of-pocket cost based on that information.  [employer] will also be able to set up a 50-month, interest-free payment plan if needed.     Applying the monitor     Shave hair from upper left chest.  Hold abrader disc by orange tab. Rub abrader in 40 strokes over the upper left chest as  indicated in your monitor instructions.  Clean area with  4 enclosed alcohol pads. Let dry.  Apply patch as indicated in monitor instructions. Patch will be placed under collarbone on left  side of chest with arrow pointing upward.  Rub patch adhesive wings for 2 minutes. Remove white label marked "1". Remove the white  label marked "2". Rub patch adhesive wings for 2 additional minutes.  While looking in a mirror, press and release button in center of patch. A small green light will  flash 3-4 times. This will be your only indicator that the monitor has been  turned on.  Do not shower for the first 24 hours. You may shower after the first 24 hours.  Press the button if you feel a symptom. You will hear a small click. Record Date, Time and  Symptom in the Patient Logbook.  When you are ready to remove the patch, follow instructions on the last 2 pages of Patient  Logbook. Stick patch monitor onto the last page of Patient Logbook.  Place Patient Logbook in the blue and white box. Use locking tab on box and tape box closed  securely. The blue and white box has prepaid postage on it. Please place it in the mailbox as  soon as possible. Your physician should have your test results approximately 7 days after the  monitor has been mailed back to [facility].  Call [facility] Customer Care at [phone] if you have questions regarding  your ZIO XT patch monitor. Call them immediately if you see an orange light blinking on your  monitor.  If your monitor falls off in less than 4 days, contact our Monitor department at [phone].  If your monitor becomes loose or falls off after 4 days call [employer] at [phone] for  suggestions on securing your monitor.

## 2023-06-02 DIAGNOSIS — R002 Palpitations: Secondary | ICD-10-CM | POA: Diagnosis not present

## 2023-06-03 DIAGNOSIS — Z961 Presence of intraocular lens: Secondary | ICD-10-CM | POA: Diagnosis not present

## 2023-06-03 DIAGNOSIS — H353132 Nonexudative age-related macular degeneration, bilateral, intermediate dry stage: Secondary | ICD-10-CM | POA: Diagnosis not present

## 2023-06-07 ENCOUNTER — Other Ambulatory Visit: Payer: Self-pay

## 2023-06-07 ENCOUNTER — Other Ambulatory Visit: Payer: Self-pay | Admitting: Nurse Practitioner

## 2023-06-07 DIAGNOSIS — J301 Allergic rhinitis due to pollen: Secondary | ICD-10-CM

## 2023-06-07 MED ORDER — CETIRIZINE HCL 10 MG PO TABS
10.0000 mg | ORAL_TABLET | Freq: Every day | ORAL | 11 refills | Status: DC
  Filled 2023-06-07 (×3): qty 30, 30d supply, fill #0
  Filled 2023-07-10: qty 30, 30d supply, fill #1
  Filled 2023-09-06: qty 30, 30d supply, fill #2
  Filled 2023-10-07: qty 30, 30d supply, fill #3
  Filled 2023-11-04: qty 30, 30d supply, fill #4
  Filled 2023-12-02: qty 30, 30d supply, fill #5
  Filled 2024-01-02: qty 30, 30d supply, fill #6
  Filled 2024-02-04: qty 30, 30d supply, fill #7
  Filled 2024-03-02: qty 30, 30d supply, fill #8
  Filled 2024-04-02: qty 30, 30d supply, fill #9
  Filled 2024-04-30: qty 30, 30d supply, fill #10
  Filled 2024-05-29: qty 30, 30d supply, fill #11

## 2023-06-10 ENCOUNTER — Ambulatory Visit (INDEPENDENT_AMBULATORY_CARE_PROVIDER_SITE_OTHER): Payer: Medicaid Other | Admitting: Nurse Practitioner

## 2023-06-10 ENCOUNTER — Encounter: Payer: Self-pay | Admitting: Nurse Practitioner

## 2023-06-10 VITALS — BP 121/79 | HR 69 | Ht 60.0 in | Wt 127.0 lb

## 2023-06-10 DIAGNOSIS — N951 Menopausal and female climacteric states: Secondary | ICD-10-CM

## 2023-06-10 DIAGNOSIS — R42 Dizziness and giddiness: Secondary | ICD-10-CM | POA: Diagnosis not present

## 2023-06-10 DIAGNOSIS — Z0000 Encounter for general adult medical examination without abnormal findings: Secondary | ICD-10-CM

## 2023-06-10 DIAGNOSIS — Z13220 Encounter for screening for lipoid disorders: Secondary | ICD-10-CM

## 2023-06-10 DIAGNOSIS — E782 Mixed hyperlipidemia: Secondary | ICD-10-CM

## 2023-06-10 NOTE — Progress Notes (Signed)
Subjective   Patient ID: Brandi Molina, female    DOB: January 05, 1966, 57 y.o.   MRN: [id number]  Chief Complaint  Patient presents with   Annual Exam    Referring provider: Barbette Merino, NP  [name] is a 57 y.o. female with Past Medical History: No date: Allergy No date: Anxiety 2008: Bartholin gland cyst 09/12/2020: COVID No date: Depression No date: Hyperlipidemia   HPI  Patient presents today for yearly physical. Overall she has been doing well.  She states that she still has issues with dizziness at times.  We discussed keeping a blood sugar level stable and staying well-hydrated.  We will refer her to physical therapy for Epley maneuvers.  Patient states that she did recently have to go to the ED and follow-up with cardiology due to heart palpitations.  She is currently wearing a Zio patch.  We will check labs today.  Patient would like to also check hormone levels due to menopausal symptoms. Denies f/c/s, n/v/d, hemoptysis, PND, leg swelling Denies chest pain or edema     No Known Allergies  Immunization History  Administered Date(s) Administered   Influenza,inj,Quad PF,6+ Mos 05/01/2018   Influenza-Unspecified 05/14/2017   Tdap 06/02/2018    Tobacco History: Social History   Tobacco Use  Smoking Status Every Day   Current packs/day: 1.00   Average packs/day: 1 pack/day for 40.0 years (40.0 ttl pk-yrs)   Types: Cigarettes  Smokeless Tobacco Never   Ready to quit: Not Answered Counseling given: Not Answered   Outpatient Encounter Medications as of 06/10/2023  Medication Sig   acetaminophen (TYLENOL) 500 MG tablet Take 1 tablet (500 mg total) by mouth every 6 (six) hours as needed.   aspirin 81 MG tablet Take 81 mg by mouth daily.   atorvastatin (LIPITOR) 20 MG tablet Take 1 tablet (20 mg total) by mouth daily.   cetirizine (ZYRTEC) 10 MG tablet Take 1 tablet (10 mg total) by mouth daily.   Cholecalciferol (VITAMIN D) 50 MCG (2000  UT) CAPS Take 1 capsule (2,000 Units total) by mouth daily.   fluticasone (FLONASE) 50 MCG/ACT nasal spray Place 2 sprays into both nostrils daily.   lidocaine (XYLOCAINE) 2 % solution Apply to affected area as needed for pain   meclizine (ANTIVERT) 25 MG tablet Take 1 tablet (25 mg total) by mouth 3 (three) times daily as needed for dizziness.   Multiple Vitamin (MULTIVITAMIN) tablet Take 1 tablet by mouth daily.   Multiple Vitamins-Minerals (PRESERVISION AREDS) CAPS Take 1 each by mouth in the morning and at bedtime.   naproxen (NAPROSYN) 375 MG tablet Take 1 tablet (375 mg total) by mouth 2 (two) times daily.   sertraline (ZOLOFT) 50 MG tablet Take 1 tablet (50 mg total) by mouth daily.   Facility-Administered Encounter Medications as of 06/10/2023  Medication   lidocaine (XYLOCAINE) 0.5 % (with pres) injection 5 mL    Review of Systems  Review of Systems  Constitutional: Negative.   HENT: Negative.    Cardiovascular: Negative.   Gastrointestinal: Negative.   Allergic/Immunologic: Negative.   Neurological: Negative.   Psychiatric/Behavioral: Negative.       Objective:   BP 121/79 (BP Location: Right Arm, Patient Position: Sitting)   Pulse 69   Ht 5' (1.524 m)   Wt 127 lb (57.6 kg)   LMP 09/20/2013 (LMP Unknown)   SpO2 94%   BMI 24.80 kg/m   Wt Readings from Last 5 Encounters:  06/10/23 127 lb (57.6 kg)  05/30/23 126 lb 12.8 oz (57.5 kg)  05/16/23 125 lb (56.7 kg)  09/25/22 126 lb (57.2 kg)  06/08/22 123 lb (55.8 kg)     Physical Exam Vitals and nursing note reviewed.  Constitutional:      General: She is not in acute distress.    Appearance: She is well-developed.  Cardiovascular:     Rate and Rhythm: Normal rate and regular rhythm.  Pulmonary:     Effort: Pulmonary effort is normal.     Breath sounds: Normal breath sounds.  Neurological:     Mental Status: She is alert and oriented to person, place, and time.       Assessment & Plan:   Routine adult  health maintenance -     Lipid panel -     Hemoglobin A1c -     Estrogens, total -     Follicle stimulating hormone  Lipid screening -     Lipid panel  Dizziness  Mixed hyperlipidemia -     Lipid panel  Vertigo -     Ambulatory referral to Physical Therapy -     Hemoglobin A1c  Menopausal symptoms -     Estrogens, total -     Follicle stimulating hormone     Return in about 1 year (around 06/09/2024).   Brandi Andrew, NP 06/10/2023

## 2023-06-10 NOTE — Patient Instructions (Addendum)
1. Lipid screening  - Lipid Panel  2. Mixed hyperlipidemia  - Lipid Panel  3. Dizziness   4. Vertigo  - Ambulatory referral to Physical Therapy   Follow up:  Follow up in 1 year

## 2023-06-11 ENCOUNTER — Other Ambulatory Visit: Payer: Self-pay

## 2023-06-11 ENCOUNTER — Encounter: Payer: Self-pay | Admitting: Physical Therapy

## 2023-06-11 ENCOUNTER — Ambulatory Visit: Payer: Medicaid Other | Attending: Nurse Practitioner | Admitting: Physical Therapy

## 2023-06-11 VITALS — BP 128/75 | HR 70

## 2023-06-11 DIAGNOSIS — R42 Dizziness and giddiness: Secondary | ICD-10-CM | POA: Insufficient documentation

## 2023-06-11 NOTE — Therapy (Signed)
OUTPATIENT PHYSICAL THERAPY VESTIBULAR EVALUATION   Patient Name: Brandi Molina MRN: [id number] DOB:[birth year], 57 y.o., female Today's Date: 06/11/2023  END OF SESSION:  PT End of Session - 06/11/23 1532     Visit Number 1    Number of Visits 1    Date for PT Re-Evaluation 06/10/24    Authorization Type [place] Medicaid    PT Start Time 1530    PT Stop Time 1615    PT Time Calculation (min) 45 min    Equipment Utilized During Treatment Gait belt    Activity Tolerance Patient tolerated treatment well    Behavior During Therapy WFL for tasks assessed/performed             Past Medical History:  Diagnosis Date   Allergy    Anxiety    Bartholin gland cyst 2008   COVID 09/12/2020   Depression    Hyperlipidemia    Past Surgical History:  Procedure Laterality Date   BREAST BIOPSY Left 2020   CATARACT EXTRACTION Bilateral    CHOLECYSTECTOMY  1999   Patient Active Problem List   Diagnosis Date Noted   Generalized anxiety disorder 10/31/2015   Vitamin D deficiency 09/20/2015   Chronic back pain 09/19/2015   Nicotine dependence 09/03/2014   Aortoiliac occlusive disease (HCC) 09/03/2014    PCP: Ivonne Andrew, NP REFERRING PROVIDER: Ivonne Andrew, NP  REFERRING DIAG: R42 (ICD-10-CM) - Vertigo  THERAPY DIAG:  Dizziness and giddiness - Plan: PT plan of care cert/re-cert  ONSET DATE: 06/10/2023 (referral date)  Rationale for Evaluation and Treatment: Rehabilitation  SUBJECTIVE:   SUBJECTIVE STATEMENT: Patient reports that sometimes she gets vertigo. Patient reports that she gets occasionally dizzy when she first gets up. Patient reports that sometimes she feels "like my head is not attached to my neck." Patient reports that sometimes she feels off for 3-5 minutes and then feels better. Patient reports that sometimes she feels more dizzy going to the L side and thinks it may be due to her R side. She reports a history of fluid on this side. Patient reports  that if she turns a certain way she gets more dizzy. Patient reports that she has to be careful to lay down a certain way so she doesn't get the room spinning. Patient does take meclizine and reports some improvement but doesn't like that it makes her drowsy. Patient reports an onset of ~ 5 years ago and would happen ~ 1-3x a month. Patient denies hearing changes and reports history of headaches (not migraines). Patient last took meclizine 2 weeks ago and last episode of room spinning was ~1 month ago. Denies any dizziness since then. No recent head imaging and has not seen ENT. No history of osteoporosis.  Pt accompanied by: self  PERTINENT HISTORY: recent episode of heart palpitations (currently on HR monitor)   PAIN:  Are you having pain? No  PRECAUTIONS: Fall  RED FLAGS: None   WEIGHT BEARING RESTRICTIONS: No  FALLS: Has patient fallen in last 6 months? No  LIVING ENVIRONMENT: Lives with: lives alone Lives in: House/apartment Stairs: Yes: Internal: 2 steps; on left going up and External: 12 steps; on right going up Has following equipment at home: shower chair  PLOF: Independent - works part-time as caregiver for older adults   PATIENT GOALS: "I just want to learn on how to get myself out of the spin."  OBJECTIVE:  Note: Objective measures were completed at Evaluation unless otherwise noted.  DIAGNOSTIC FINDINGS: No  relevant recent imaging   COGNITION: Overall cognitive status: Impaired   SENSATION: L hand tingling on L arm  Cervical ROM:    Active A/PROM (deg) eval  Flexion WFL  Extension WFL  Right lateral flexion WFL  Left lateral flexion WFL  Right rotation WFL  Left rotation WFL  (Blank rows = not tested)  PATIENT SURVEYS:  FOTO Not captured by front - not indicated at this time given normal vestibular ranges  VESTIBULAR ASSESSMENT:  GENERAL OBSERVATION: ambulates without AD, no glasses and contacts   SYMPTOM BEHAVIOR:  Subjective history: see  above  Non-Vestibular symptoms: headaches, nausea/vomiting, and migraine symptoms Type of dizziness: Imbalance (Disequilibrium), Spinning/Vertigo, Unsteady with head/body turns, Lightheadedness/Faint, "Funny feeling in the head", "World moves", and "Swimmyheaded"  Frequency: 1-3x month   Duration: feeling lasts for hours but okay as long as don't turn a certain way  Aggravating factors: rolling to the L side, turning a certain way   Relieving factors: sitting still or laying helps  Progression of symptoms: better  OCULOMOTOR EXAM:  Ocular Alignment: normal  Ocular ROM: No Limitations  Spontaneous Nystagmus: absent  Gaze-Induced Nystagmus: age appropriate nystagmus at end range  Smooth Pursuits: intact - age appropriate nystagmus at end range  Saccades: intact  Convergence/Divergence: < 5 cm   Test of Skew: WNL  VBI: negative bialterally   VESTIBULAR - OCULAR REFLEX:   Slow VOR: Normal  VOR Cancellation: Normal  Head-Impulse Test: HIT Right: negative HIT Left: negative  Dynamic Visual Acuity: Not performed   POSITIONAL TESTING:  Right Dix-Hallpike: no nystagmus Left Dix-Hallpike: no nystagmus Right Roll Test: no nystagmus Left Roll Test: no nystagmus  MOTION SENSITIVITY:  Motion Sensitivity Quotient Intensity: 0 = none, 1 = Lightheaded, 2 = Mild, 3 = Moderate, 4 = Severe, 5 = Vomiting  Intensity  1. Sitting to supine 0  2. Supine to L side 0  3. Supine to R side 0  4. Supine to sitting 0  5. L Hallpike-Dix 0  6. Up from L  1  7. R Hallpike-Dix 0  8. Up from R  1  9. Sitting, head tipped to L knee 0  10. Head up from L knee 0  11. Sitting, head tipped to R knee 0  12. Head up from R knee 0  13. Sitting head turns x5 0  14.Sitting head nods x5 0  15. In stance, 180 turn to L  1  16. In stance, 180 turn to R 0    OTHOSTATICS: not done  VESTIBULAR TREATMENT:                                                                                                     Initial Eval only  PATIENT EDUCATION: Education details: Briefly discussed findings and positional maneuvers but recommended return to PT to ensure that was what going on, discussed examination findings and why not picking up for PT, patient in agreement  Person educated: Patient Education method: Explanation Education comprehension: verbalized understanding and returned demonstration  HOME EXERCISE PROGRAM:  Not indicated at this time  GOALS: Not indicated at this time  ASSESSMENT:  CLINICAL IMPRESSION: Patient is a 57 y.o. female who was seen today for physical therapy evaluation for vertigo. Patient presents with normal vestibular/oculomotor exam in today's session. Patient subjective most consistent for the BPPV that has since resolved; however, as normal vestibular exam cannot fully rule out other contributors as well. Discussed with patient when to return to therapy with new referral if noticed return of symptoms. At this time no indicators for further PT given exam findings and patient in agreement.   OBJECTIVE IMPAIRMENTS: dizziness.   ACTIVITY LIMITATIONS: bending and bed mobility  PARTICIPATION LIMITATIONS:  cleaning or yard work  PERSONAL FACTORS: Age, Time since onset of injury/illness/exacerbation, and 1 comorbidity: see above  are also affecting patient's functional outcome.   CLINICAL DECISION MAKING: Stable/uncomplicated  EVALUATION COMPLEXITY: Low   PLAN:  PT not indicated at this time  Carmelia Bake, PT, DPT 06/11/2023, 4:40 PM

## 2023-06-14 LAB — LIPID PANEL
Chol/HDL Ratio: 2.5 ratio (ref 0.0–4.4)
Cholesterol, Total: 119 mg/dL (ref 100–199)
HDL: 48 mg/dL (ref >39)
LDL Chol Calc (NIH): 55 mg/dL (ref 0–99)
Triglycerides: 83 mg/dL (ref 0–149)
VLDL Cholesterol Cal: 16 mg/dL (ref 5–40)

## 2023-06-14 LAB — ESTROGENS, TOTAL: Estrogen: 59 pg/mL (ref 40–244)

## 2023-06-14 LAB — FOLLICLE STIMULATING HORMONE: FSH: 96.4 m[IU]/mL

## 2023-06-14 LAB — HEMOGLOBIN A1C
Est. average glucose Bld gHb Est-mCnc: 114 mg/dL
Hgb A1c MFr Bld: 5.6 % (ref 4.8–5.6)

## 2023-06-26 ENCOUNTER — Encounter: Payer: Self-pay | Admitting: Gastroenterology

## 2023-07-08 ENCOUNTER — Encounter: Payer: Self-pay | Admitting: Gastroenterology

## 2023-07-11 ENCOUNTER — Other Ambulatory Visit: Payer: Self-pay

## 2023-07-12 ENCOUNTER — Other Ambulatory Visit: Payer: Self-pay

## 2023-07-12 ENCOUNTER — Other Ambulatory Visit (HOSPITAL_COMMUNITY): Payer: Self-pay

## 2023-07-12 DIAGNOSIS — I4729 Other ventricular tachycardia: Secondary | ICD-10-CM

## 2023-07-12 MED ORDER — METOPROLOL SUCCINATE ER 25 MG PO TB24
25.0000 mg | ORAL_TABLET | Freq: Every day | ORAL | 3 refills | Status: AC
Start: 2023-07-12 — End: ?
  Filled 2023-07-12 (×2): qty 90, 90d supply, fill #0

## 2023-07-12 NOTE — Progress Notes (Signed)
Orders for echo, exercise nuclear stress test, and Toprol-XL 25 mg placed per Dr. Odis Hollingshead. LVM for pt and MyChart message sent to pt. Instructions for the exercise nuclear stress test were included in the MyChart message.

## 2023-07-14 NOTE — Progress Notes (Signed)
Results reviewed over the phone with the patient on 07/12/2023 Will start Toprol-XL 25 mg p.o. daily.   Patient denies use of illicit drugs including cocaine. Echo will be ordered to evaluate for structural heart disease and left ventricular systolic function. Exercise nuclear stress test to evaluate for functional capacity, exercise-induced arrhythmia/ischemia.  Shared Decision Making/Informed Consent The risks [chest pain, shortness of breath, cardiac arrhythmias, dizziness, blood pressure fluctuations, myocardial infarction, stroke/transient ischemic attack, nausea, vomiting, allergic reaction, radiation exposure, metallic taste sensation and life-threatening complications (estimated to be 1 in 10,000)], benefits (risk stratification, diagnosing coronary artery disease, treatment guidance) and alternatives of a nuclear stress test were discussed in detail with Brandi Molina and she agrees to proceed.  Graeme Menees Hanston, DO, [facility]

## 2023-07-29 ENCOUNTER — Ambulatory Visit (INDEPENDENT_AMBULATORY_CARE_PROVIDER_SITE_OTHER): Payer: Medicaid Other | Admitting: Nurse Practitioner

## 2023-07-29 ENCOUNTER — Encounter: Payer: Self-pay | Admitting: Nurse Practitioner

## 2023-07-29 ENCOUNTER — Other Ambulatory Visit: Payer: Self-pay

## 2023-07-29 VITALS — BP 107/67 | HR 77 | Temp 97.0°F | Wt 128.0 lb

## 2023-07-29 DIAGNOSIS — J019 Acute sinusitis, unspecified: Secondary | ICD-10-CM | POA: Diagnosis not present

## 2023-07-29 DIAGNOSIS — F17200 Nicotine dependence, unspecified, uncomplicated: Secondary | ICD-10-CM | POA: Diagnosis not present

## 2023-07-29 MED ORDER — AMOXICILLIN-POT CLAVULANATE 875-125 MG PO TABS
1.0000 | ORAL_TABLET | Freq: Two times a day (BID) | ORAL | 0 refills | Status: DC
Start: 2023-07-29 — End: 2023-09-06
  Filled 2023-07-29: qty 14, 7d supply, fill #0

## 2023-07-29 MED ORDER — BENZONATATE 100 MG PO CAPS
100.0000 mg | ORAL_CAPSULE | Freq: Three times a day (TID) | ORAL | 0 refills | Status: AC | PRN
Start: 2023-07-29 — End: 2024-07-28
  Filled 2023-07-29: qty 20, 7d supply, fill #0

## 2023-07-29 MED ORDER — SALINE SPRAY 0.65 % NA SOLN
1.0000 | NASAL | 0 refills | Status: AC | PRN
Start: 2023-07-29 — End: ?
  Filled 2023-07-29: qty 30, fill #0

## 2023-07-29 NOTE — Progress Notes (Addendum)
Acute Office Visit  Subjective:     Patient ID: Brandi Molina, female    DOB: Feb 25, 1966, 57 y.o.   MRN: [id number]  Chief Complaint  Patient presents with   Cough   URI    For about two weeks     Cough Associated symptoms include rhinorrhea. Pertinent negatives include no chest pain, chills, fever, headaches, myalgias, postnasal drip, rash, shortness of breath or wheezing.  URI  Associated symptoms include congestion, coughing and rhinorrhea. Pertinent negatives include no abdominal pain, chest pain, dysuria, headaches, nausea, rash, sneezing, vomiting or wheezing.    has a past medical history of Allergy, Anxiety, Bartholin gland cyst (2008), COVID (09/12/2020), Depression, Hyperlipidemia, and Tobacco use disorder (09/03/2014).  Patient is with complaints of cough with clear sputum, greenish nasal discharge, nasal congestion, stuffy nose ongoing for the past 2 weeks, stated that she had some headache which has resolved.  Smokes half pack of cigarettes daily.  She denies known flu or COVID contacts.  Has been taking Claritin 10 mg daily, Flonase nasal spray without relief.  Patient denies fever, shortness of breath, wheezing abdominal pain nausea vomiting.     Review of Systems  Constitutional:  Negative for appetite change, chills, fatigue and fever.  HENT:  Positive for congestion and rhinorrhea. Negative for postnasal drip and sneezing.   Respiratory:  Positive for cough. Negative for shortness of breath and wheezing.   Cardiovascular:  Negative for chest pain, palpitations and leg swelling.  Gastrointestinal:  Negative for abdominal pain, constipation, nausea and vomiting.  Genitourinary:  Negative for difficulty urinating, dysuria, flank pain and frequency.  Musculoskeletal:  Negative for arthralgias, back pain, joint swelling and myalgias.  Skin:  Negative for color change, pallor, rash and wound.  Neurological:  Negative for dizziness, facial asymmetry, weakness,  numbness and headaches.  Psychiatric/Behavioral:  Negative for behavioral problems, confusion, self-injury and suicidal ideas.         Objective:    BP 107/67   Pulse 77   Temp (!) 97 F (36.1 C)   Wt 128 lb (58.1 kg)   LMP 09/20/2013 (LMP Unknown)   SpO2 100%   BMI 25.00 kg/m    Physical Exam Vitals reviewed.  Constitutional:      General: She is not in acute distress.    Appearance: Normal appearance. She is not ill-appearing, toxic-appearing or diaphoretic.  HENT:     Right Ear: Tympanic membrane, ear canal and external ear normal. There is no impacted cerumen.     Left Ear: Tympanic membrane, ear canal and external ear normal. There is no impacted cerumen.     Nose: No congestion or rhinorrhea.     Mouth/Throat:     Mouth: Mucous membranes are moist.     Pharynx: Oropharynx is clear. No oropharyngeal exudate or posterior oropharyngeal erythema.  Eyes:     General: No scleral icterus.       Right eye: No discharge.        Left eye: No discharge.     Extraocular Movements: Extraocular movements intact.     Conjunctiva/sclera: Conjunctivae normal.  Cardiovascular:     Rate and Rhythm: Normal rate and regular rhythm.     Pulses: Normal pulses.     Heart sounds: Normal heart sounds. No murmur heard. Pulmonary:     Effort: Pulmonary effort is normal. No respiratory distress.     Breath sounds: Normal breath sounds. No stridor. No wheezing, rhonchi or rales.  Chest:  Chest wall: No tenderness.  Abdominal:     General: There is no distension.     Palpations: Abdomen is soft.     Tenderness: There is no abdominal tenderness. There is no guarding.  Musculoskeletal:        General: No tenderness.     Cervical back: Normal range of motion and neck supple. No rigidity or tenderness.     Right lower leg: No edema.     Left lower leg: No edema.  Lymphadenopathy:     Cervical: No cervical adenopathy.  Skin:    General: Skin is warm and dry.     Capillary Refill:  Capillary refill takes less than 2 seconds.  Neurological:     Mental Status: She is alert and oriented to person, place, and time.     Motor: No weakness.     Coordination: Coordination normal.     Gait: Gait normal.  Psychiatric:        Mood and Affect: Mood normal.        Behavior: Behavior normal.        Thought Content: Thought content normal.        Judgment: Judgment normal.     No results found for any visits on 07/29/23.      Assessment & Plan:   Problem List Items Addressed This Visit       Respiratory   Acute sinusitis - Primary    - amoxicillin-clavulanate (AUGMENTIN) 875-125 MG tablet; Take 1 tablet by mouth 2 (two) times daily.  Dispense: 14 tablet; Refill: 0 - benzonatate (TESSALON PERLES) 100 MG capsule; Take 1 capsule (100 mg total) by mouth 3 (three) times daily as needed for cough.  Dispense: 20 capsule; Refill: 0 - sodium chloride (OCEAN) 0.65 % SOLN nasal spray; Place 1 spray into both nostrils as needed for congestion.  Dispense: 30 mL; Refill: 0  Encouraged to maintain hydration and take Tylenol as needed for headaches Smoking cessation encouraged      Relevant Medications   amoxicillin-clavulanate (AUGMENTIN) 875-125 MG tablet   benzonatate (TESSALON PERLES) 100 MG capsule   sodium chloride (OCEAN) 0.65 % SOLN nasal spray     Other   Tobacco use disorder   Smoking cessation encouraged       Meds ordered this encounter  Medications   amoxicillin-clavulanate (AUGMENTIN) 875-125 MG tablet    Sig: Take 1 tablet by mouth 2 (two) times daily.    Dispense:  14 tablet    Refill:  0   benzonatate (TESSALON PERLES) 100 MG capsule    Sig: Take 1 capsule (100 mg total) by mouth 3 (three) times daily as needed for cough.    Dispense:  20 capsule    Refill:  0   sodium chloride (OCEAN) 0.65 % SOLN nasal spray    Sig: Place 1 spray into both nostrils as needed for congestion.    Dispense:  30 mL    Refill:  0    No follow-ups on  file.  Donell Beers, FNP

## 2023-07-29 NOTE — Patient Instructions (Signed)
1. Acute sinusitis, recurrence not specified, unspecified location (Primary)  - amoxicillin-clavulanate (AUGMENTIN) 875-125 MG tablet; Take 1 tablet by mouth 2 (two) times daily.  Dispense: 14 tablet; Refill: 0 - benzonatate (TESSALON PERLES) 100 MG capsule; Take 1 capsule (100 mg total) by mouth 3 (three) times daily as needed for cough.  Dispense: 20 capsule; Refill: 0 - sodium chloride (OCEAN) 0.65 % SOLN nasal spray; Place 1 spray into both nostrils as needed for congestion.  Dispense: 30 mL; Refill: 0     It is important that you exercise regularly at least 30 minutes 5 times a week as tolerated  Think about what you will eat, plan ahead. Choose " clean, green, fresh or frozen" over canned, processed or packaged foods which are more sugary, salty and fatty. 70 to 75% of food eaten should be vegetables and fruit. Three meals at set times with snacks allowed between meals, but they must be fruit or vegetables. Aim to eat over a 12 hour period , example 7 am to 7 pm, and STOP after  your last meal of the day. Drink water,generally about 64 ounces per day, no other drink is as healthy. Fruit juice is best enjoyed in a healthy way, by EATING the fruit.  Thanks for choosing [facility] we consider it a privelige to serve you.

## 2023-07-29 NOTE — Assessment & Plan Note (Signed)
-   amoxicillin-clavulanate (AUGMENTIN) 875-125 MG tablet; Take 1 tablet by mouth 2 (two) times daily.  Dispense: 14 tablet; Refill: 0 - benzonatate (TESSALON PERLES) 100 MG capsule; Take 1 capsule (100 mg total) by mouth 3 (three) times daily as needed for cough.  Dispense: 20 capsule; Refill: 0 - sodium chloride (OCEAN) 0.65 % SOLN nasal spray; Place 1 spray into both nostrils as needed for congestion.  Dispense: 30 mL; Refill: 0  Encouraged to maintain hydration and take Tylenol as needed for headaches Smoking cessation encouraged

## 2023-07-29 NOTE — Assessment & Plan Note (Signed)
Smoking cessation encouraged!

## 2023-08-02 ENCOUNTER — Other Ambulatory Visit: Payer: Self-pay

## 2023-08-02 ENCOUNTER — Ambulatory Visit (AMBULATORY_SURGERY_CENTER): Payer: Medicaid Other | Admitting: *Deleted

## 2023-08-02 VITALS — Ht 60.0 in | Wt 123.0 lb

## 2023-08-02 DIAGNOSIS — Z860100 Personal history of colon polyps, unspecified: Secondary | ICD-10-CM

## 2023-08-02 MED ORDER — PLENVU 140 G PO SOLR
1.0000 | Freq: Once | ORAL | 0 refills | Status: AC
Start: 2023-08-02 — End: 2023-08-07
  Filled 2023-08-02: qty 3, 1d supply, fill #0

## 2023-08-02 NOTE — Progress Notes (Signed)
Pre visit completed over telephone.  Instructions forwarded through MyChart.    No egg or soy allergy known to patient  No issues known to pt with past sedation with any surgeries or procedures Patient denies ever being told they had issues or difficulty with intubation  No FH of Malignant Hyperthermia Pt is not on diet pills Pt is not on  home 02  Pt is not on blood thinners  Pt denies issues with constipation  No A fib or A flutter Have any cardiac testing pending-NO Pt instructed to use [employer] or GoodRx for a price reduction on prep   

## 2023-08-05 ENCOUNTER — Other Ambulatory Visit: Payer: Self-pay

## 2023-08-05 ENCOUNTER — Telehealth (HOSPITAL_COMMUNITY): Payer: Self-pay

## 2023-08-05 ENCOUNTER — Other Ambulatory Visit: Payer: Self-pay | Admitting: Nurse Practitioner

## 2023-08-05 DIAGNOSIS — I7409 Other arterial embolism and thrombosis of abdominal aorta: Secondary | ICD-10-CM

## 2023-08-06 ENCOUNTER — Other Ambulatory Visit: Payer: Self-pay

## 2023-08-08 ENCOUNTER — Other Ambulatory Visit: Payer: Self-pay

## 2023-08-08 MED ORDER — ATORVASTATIN CALCIUM 20 MG PO TABS
20.0000 mg | ORAL_TABLET | Freq: Every day | ORAL | 6 refills | Status: DC
  Filled 2023-08-08: qty 30, 30d supply, fill #0
  Filled 2023-09-06: qty 30, 30d supply, fill #1
  Filled 2023-10-07: qty 30, 30d supply, fill #2
  Filled 2023-11-04: qty 30, 30d supply, fill #3
  Filled 2023-12-02: qty 30, 30d supply, fill #4
  Filled 2024-01-02: qty 30, 30d supply, fill #5
  Filled 2024-02-04: qty 30, 30d supply, fill #6

## 2023-08-09 ENCOUNTER — Other Ambulatory Visit: Payer: Self-pay

## 2023-08-13 ENCOUNTER — Ambulatory Visit (HOSPITAL_BASED_OUTPATIENT_CLINIC_OR_DEPARTMENT_OTHER): Payer: Medicaid Other

## 2023-08-13 ENCOUNTER — Ambulatory Visit (HOSPITAL_COMMUNITY): Payer: Medicaid Other | Attending: Cardiology

## 2023-08-13 DIAGNOSIS — I4729 Other ventricular tachycardia: Secondary | ICD-10-CM | POA: Insufficient documentation

## 2023-08-13 LAB — ECHOCARDIOGRAM COMPLETE
AV Vena cont: 0.2 cm
Area-P 1/2: 3.42 cm2
P 1/2 time: 317 ms
S' Lateral: 2.7 cm

## 2023-08-13 LAB — MYOCARDIAL PERFUSION IMAGING
Angina Index: 0
Duke Treadmill Score: 7
Estimated workload: 8.4
Exercise duration (min): 7 min
Exercise duration (sec): 25 s
LV dias vol: 50 mL (ref 46–106)
LV sys vol: 17 mL
MPHR: 164 {beats}/min
Nuc Stress EF: 65 %
Peak HR: 148 {beats}/min
Percent HR: 90 %
Rest HR: 70 {beats}/min
Rest Nuclear Isotope Dose: 11 mCi
SDS: 1
SRS: 0
SSS: 1
ST Depression (mm): 0 mm
Stress Nuclear Isotope Dose: 32.8 mCi
TID: 0.85

## 2023-08-13 MED ORDER — TECHNETIUM TC 99M TETROFOSMIN IV KIT
11.0000 | PACK | Freq: Once | INTRAVENOUS | Status: AC | PRN
  Administered 2023-08-13: 11 via INTRAVENOUS

## 2023-08-13 MED ORDER — TECHNETIUM TC 99M TETROFOSMIN IV KIT
32.8000 | PACK | Freq: Once | INTRAVENOUS | Status: AC | PRN
  Administered 2023-08-13: 32.8 via INTRAVENOUS

## 2023-08-18 ENCOUNTER — Encounter: Payer: Self-pay | Admitting: Certified Registered Nurse Anesthetist

## 2023-08-19 ENCOUNTER — Ambulatory Visit: Payer: Medicaid Other | Admitting: Gastroenterology

## 2023-08-19 ENCOUNTER — Encounter: Payer: Self-pay | Admitting: Gastroenterology

## 2023-08-19 VITALS — BP 129/81 | HR 66 | Temp 98.2°F | Resp 21

## 2023-08-19 DIAGNOSIS — F32A Depression, unspecified: Secondary | ICD-10-CM | POA: Diagnosis not present

## 2023-08-19 DIAGNOSIS — Z860100 Personal history of colon polyps, unspecified: Secondary | ICD-10-CM

## 2023-08-19 DIAGNOSIS — K635 Polyp of colon: Secondary | ICD-10-CM | POA: Diagnosis not present

## 2023-08-19 DIAGNOSIS — D125 Benign neoplasm of sigmoid colon: Secondary | ICD-10-CM

## 2023-08-19 DIAGNOSIS — D123 Benign neoplasm of transverse colon: Secondary | ICD-10-CM | POA: Diagnosis not present

## 2023-08-19 DIAGNOSIS — Z860101 Personal history of adenomatous and serrated colon polyps: Secondary | ICD-10-CM

## 2023-08-19 DIAGNOSIS — Z1211 Encounter for screening for malignant neoplasm of colon: Secondary | ICD-10-CM | POA: Diagnosis not present

## 2023-08-19 DIAGNOSIS — F419 Anxiety disorder, unspecified: Secondary | ICD-10-CM | POA: Diagnosis not present

## 2023-08-19 MED ORDER — SODIUM CHLORIDE 0.9 % IV SOLN: 500.0000 mL | Freq: Once | INTRAVENOUS | Status: DC

## 2023-08-19 NOTE — Progress Notes (Signed)
 Report given to PACU, vss

## 2023-08-19 NOTE — Progress Notes (Signed)
 Called to room to assist during endoscopic procedure.  Patient ID and intended procedure confirmed with present staff. Received instructions for my participation in the procedure from the performing physician.

## 2023-08-19 NOTE — Patient Instructions (Signed)
Please read handouts provided. Continue present medications. Await pathology results. Repeat colonoscopy in 3 years for screening.   YOU HAD AN ENDOSCOPIC PROCEDURE TODAY AT THE [facility]:   Refer to the procedure report that was given to you for any specific questions about what was found during the examination.  If the procedure report does not answer your questions, please call your gastroenterologist to clarify.  If you requested that your care partner not be given the details of your procedure findings, then the procedure report has been included in a sealed envelope for you to review at your convenience later.  YOU SHOULD EXPECT: Some feelings of bloating in the abdomen. Passage of more gas than usual.  Walking can help get rid of the air that was put into your GI tract during the procedure and reduce the bloating. If you had a lower endoscopy (such as a colonoscopy or flexible sigmoidoscopy) you may notice spotting of blood in your stool or on the toilet paper. If you underwent a bowel prep for your procedure, you may not have a normal bowel movement for a few days.  Please Note:  You might notice some irritation and congestion in your nose or some drainage.  This is from the oxygen used during your procedure.  There is no need for concern and it should clear up in a day or so.  SYMPTOMS TO REPORT IMMEDIATELY:  Following lower endoscopy (colonoscopy or flexible sigmoidoscopy):  Excessive amounts of blood in the stool  Significant tenderness or worsening of abdominal pains  Swelling of the abdomen that is new, acute  Fever of 100F or higher.  For urgent or emergent issues, a gastroenterologist can be reached at any hour by calling [phone]. Do not use MyChart messaging for urgent concerns.    DIET:  We do recommend a small meal at first, but then you may proceed to your regular diet.  Drink plenty of fluids but you should avoid alcoholic beverages for 24  hours.  ACTIVITY:  You should plan to take it easy for the rest of today and you should NOT DRIVE or use heavy machinery until tomorrow (because of the sedation medicines used during the test).    FOLLOW UP: Our staff will call the number listed on your records the next business day following your procedure.  We will call around 7:15- 8:00 am to check on you and address any questions or concerns that you may have regarding the information given to you following your procedure. If we do not reach you, we will leave a message.     If any biopsies were taken you will be contacted by phone or by letter within the next 1-3 weeks.  Please call us at [phone] if you have not heard about the biopsies in 3 weeks.    SIGNATURES/CONFIDENTIALITY: You and/or your care partner have signed paperwork which will be entered into your electronic medical record.  These signatures attest to the fact that that the information above on your After Visit Summary has been reviewed and is understood.  Full responsibility of the confidentiality of this discharge information lies with you and/or your care-partner.  

## 2023-08-19 NOTE — Progress Notes (Signed)
 Pt's states no medical or surgical changes since previsit or office visit.

## 2023-08-19 NOTE — Op Note (Signed)
 [facility] Patient Name: Brandi Molina Procedure Date: 08/19/2023 2:49 PM MRN: [id number] Endoscopist: Victory L. Legrand , MD, [id number] Age: 58 Referring MD:  Date of Birth: 01/30/1966 Gender: Female Account #: [id number] Procedure:                Colonoscopy Indications:              Surveillance: Personal history of adenomatous                            polyps on last colonoscopy 5 years ago                           Two diminutive tubular adenomas Nov 2019 (good prep                            with plenvu ) Medicines:                Monitored Anesthesia Care Procedure:                Pre-Anesthesia Assessment:                           - Prior to the procedure, a History and Physical                            was performed, and patient medications and                            allergies were reviewed. The patient's tolerance of                            previous anesthesia was also reviewed. The risks                            and benefits of the procedure and the sedation                            options and risks were discussed with the patient.                            All questions were answered, and informed consent                            was obtained. Prior Anticoagulants: The patient has                            taken no anticoagulant or antiplatelet agents. ASA                            Grade Assessment: II - A patient with mild systemic                            disease. After reviewing the risks and benefits,  the patient was deemed in satisfactory condition to                            undergo the procedure.                           After obtaining informed consent, the colonoscope                            was passed under direct vision. Throughout the                            procedure, the patient's blood pressure, pulse, and                            oxygen saturations were monitored continuously. The                             Olympus Scope SN: [id number] was introduced through                            the anus and advanced to the the cecum, identified                            by appendiceal orifice and ileocecal valve. The                            colonoscopy was performed without difficulty. The                            patient tolerated the procedure well. The quality                            of the bowel preparation was fair in some areas                            despite lavage. The ileocecal valve, appendiceal                            orifice, and rectum were photographed. The bowel                            preparation used was Plenvu  via split dose                            instruction.(patient reported difficulty tolerating                            AM dose) Scope In: 3:01:48 PM Scope Out: 3:19:01 PM Scope Withdrawal Time: 0 hours 14 minutes 42 seconds  Total Procedure Duration: 0 hours 17 minutes 13 seconds  Findings:                 The perianal and digital rectal examinations were  normal.                           Repeat examination of right colon under NBI                            performed.                           Two sessile polyps were found in the sigmoid colon                            and transverse colon. The polyps were 5 to 6 mm in                            size. These polyps were removed with a cold snare.                            Resection and retrieval were complete.                           The exam was otherwise without abnormality on                            direct and retroflexion views. Complications:            No immediate complications. Estimated Blood Loss:     Estimated blood loss was minimal. Impression:               - Preparation of the colon was fair.                           - Two 5 to 6 mm polyps in the sigmoid colon and in                            the transverse colon, removed with a cold snare.                             Resected and retrieved.                           - The examination was otherwise normal on direct                            and retroflexion views. Recommendation:           - Patient has a contact number available for                            emergencies. The signs and symptoms of potential                            delayed complications were discussed with the                            patient. Return  to normal activities tomorrow.                            Written discharge instructions were provided to the                            patient.                           - Resume previous diet.                           - Continue present medications.                           - Await pathology results.                           - Repeat colonoscopy in 3 years for surveillance.                            (for next exam: Suprep, anti-emetic premedication,                            consume more water with prep) Victory L. Legrand, MD 08/19/2023 3:23:39 PM This report has been signed electronically.

## 2023-08-19 NOTE — Progress Notes (Signed)
 History and Physical:  This patient presents for endoscopic testing for: Encounter Diagnosis  Name Primary?   Hx of colonic polyps Yes    Surveillance colonoscopy for Hx of two diminutive adenomas in Nov 2019. Patient denies chronic abdominal pain, rectal bleeding, constipation or diarrhea.   Patient is otherwise without complaints or active issues today.   Past Medical History: Past Medical History:  Diagnosis Date   Allergy    Anxiety    Bartholin gland cyst 2008   COVID 09/12/2020   Depression    Hyperlipidemia    Tobacco use disorder 09/03/2014     Past Surgical History: Past Surgical History:  Procedure Laterality Date   BREAST BIOPSY Left 2020   CATARACT EXTRACTION Bilateral    CHOLECYSTECTOMY  1999    Allergies: No Known Allergies  Outpatient Meds: Current Outpatient Medications  Medication Sig Dispense Refill   acetaminophen  (TYLENOL ) 500 MG tablet Take 1 tablet (500 mg total) by mouth every 6 (six) hours as needed. 30 tablet 0   amoxicillin -clavulanate (AUGMENTIN ) 875-125 MG tablet Take 1 tablet by mouth 2 (two) times daily. 14 tablet 0   aspirin 81 MG tablet Take 81 mg by mouth daily.     atorvastatin  (LIPITOR) 20 MG tablet Take 1 tablet (20 mg total) by mouth daily. 30 tablet 6   benzonatate  (TESSALON  PERLES) 100 MG capsule Take 1 capsule (100 mg total) by mouth 3 (three) times daily as needed for cough. 20 capsule 0   cetirizine  (ZYRTEC ) 10 MG tablet Take 1 tablet (10 mg total) by mouth daily. 30 tablet 11   Cholecalciferol (VITAMIN D ) 50 MCG (2000 UT) CAPS Take 1 capsule (2,000 Units total) by mouth daily. 30 capsule 6   fluticasone  (FLONASE ) 50 MCG/ACT nasal spray Place 2 sprays into both nostrils daily. 16 g 6   lidocaine  (XYLOCAINE ) 2 % solution Apply to affected area as needed for pain (Patient not taking: Reported on 07/29/2023) 30 mL 1   meclizine  (ANTIVERT ) 25 MG tablet Take 1 tablet (25 mg total) by mouth 3 (three) times daily as needed for  dizziness. 30 tablet 0   metoprolol  succinate (TOPROL  XL) 25 MG 24 hr tablet Take 1 tablet (25 mg total) by mouth daily. 90 tablet 3   Multiple Vitamin (MULTIVITAMIN) tablet Take 1 tablet by mouth daily.     Multiple Vitamins-Minerals (PRESERVISION AREDS) CAPS Take 1 each by mouth in the morning and at bedtime.     naproxen  (NAPROSYN ) 375 MG tablet Take 1 tablet (375 mg total) by mouth 2 (two) times daily. (Patient not taking: Reported on 08/02/2023) 30 tablet 6   sertraline  (ZOLOFT ) 50 MG tablet Take 1 tablet (50 mg total) by mouth daily. 90 tablet 1   sodium chloride  (OCEAN) 0.65 % SOLN nasal spray Place 1 spray into both nostrils as needed for congestion. 30 mL 0   Current Facility-Administered Medications  Medication Dose Route Frequency Provider Last Rate Last Admin   0.9 %  sodium chloride  infusion  500 mL Intravenous Once Danis, Victory CROME III, MD       lidocaine  (XYLOCAINE ) 0.5 % (with pres) injection 5 mL  5 mL Intradermal Once Hollis, Lachina M, FNP          ___________________________________________________________________ Objective   Exam:  Temp 98.2 F (36.8 C)   LMP 09/20/2013 (LMP Unknown)   CV: regular , S1/S2 Resp: clear to auscultation bilaterally, normal RR and effort noted GI: soft, no tenderness, with active bowel sounds.   Assessment:  Encounter Diagnosis  Name Primary?   Hx of colonic polyps Yes     Plan: Colonoscopy   The benefits and risks of the planned procedure were described in detail with the patient or (when appropriate) their health care proxy.  Risks were outlined as including, but not limited to, bleeding, infection, perforation, adverse medication reaction leading to cardiac or pulmonary decompensation, pancreatitis (if ERCP).  The limitation of incomplete mucosal visualization was also discussed.  No guarantees or warranties were given.  The patient is appropriate for an endoscopic procedure in the ambulatory setting.   - Victory Brand,  MD

## 2023-08-20 ENCOUNTER — Telehealth: Payer: Self-pay

## 2023-08-20 NOTE — Telephone Encounter (Signed)
 No answer, left message to call if having any issues or concerns, B.Vale Mousseau RN

## 2023-08-22 ENCOUNTER — Encounter: Payer: Self-pay | Admitting: Gastroenterology

## 2023-08-22 LAB — SURGICAL PATHOLOGY

## 2023-08-30 ENCOUNTER — Encounter: Payer: Self-pay | Admitting: Cardiology

## 2023-08-30 ENCOUNTER — Ambulatory Visit: Payer: Medicaid Other | Attending: Cardiology | Admitting: Cardiology

## 2023-08-30 VITALS — BP 120/78 | HR 75 | Resp 16 | Ht 60.0 in | Wt 128.0 lb

## 2023-08-30 DIAGNOSIS — I700 Atherosclerosis of aorta: Secondary | ICD-10-CM

## 2023-08-30 DIAGNOSIS — R002 Palpitations: Secondary | ICD-10-CM | POA: Diagnosis not present

## 2023-08-30 DIAGNOSIS — F17210 Nicotine dependence, cigarettes, uncomplicated: Secondary | ICD-10-CM

## 2023-08-30 DIAGNOSIS — I493 Ventricular premature depolarization: Secondary | ICD-10-CM

## 2023-08-30 DIAGNOSIS — R0683 Snoring: Secondary | ICD-10-CM | POA: Diagnosis not present

## 2023-08-30 DIAGNOSIS — I4729 Other ventricular tachycardia: Secondary | ICD-10-CM | POA: Diagnosis not present

## 2023-08-30 NOTE — Patient Instructions (Signed)
Medication Instructions:  Your physician recommends that you continue on your current medications as directed. Please refer to the Current Medication list given to you today.  *If you need a refill on your cardiac medications before your next appointment, please call your pharmacy*  Lab Work: None ordered today.  Testing/Procedures: None ordered today.  Follow-Up: At [facility], you and your health needs are our priority.  As part of our continuing mission to provide you with exceptional heart care, we have created designated Provider Care Teams.  These Care Teams include your primary Cardiologist (physician) and Advanced Practice Providers (APPs -  Physician Assistants and Nurse Practitioners) who all work together to provide you with the care you need, when you need it.  Your next appointment:   As needed  The format for your next appointment:   In Person  Provider:   Tessa Lerner, DO {

## 2023-08-30 NOTE — Progress Notes (Signed)
Cardiology Office Note:    Date:  08/30/2023  NAME:  Brandi Molina    MRN: [id number] DOB:  01-02-1966   PCP:  Ivonne Andrew, NP  Former Cardiology Providers: None Primary Cardiologist:  Tessa Lerner, DO, [facility] (established care 05/30/2023) Electrophysiologist:  None   Chief Complaint  Patient presents with   Palpitations   Follow-up    3 months    History of Present Illness:    Brandi Molina is a 58 y.o. Caucasian female whose past medical history and cardiovascular risk factors includes: Cigarette smoker, mixed hyperlipidemia, Aortoiliac occlusive disease, family history of premature CAD.   Patient was referred to the practice for evaluation of palpitations.  It was felt that her palpitations were likely attributed due to excessive consumption of caffeinated beverages (if she was drinking at least 6 bottles of 16 ounce soda per day).  She was advised to cut down on the consumption of caffeinated beverages and she did undergo a cardiac monitor to evaluate for dysrhythmias.  Cardiac monitor noted an average heart rate of 77 bpm, asymptomatic episodes of ventricular runs NSVT, patient triggered events associated with sinus rhythm with PVCs.   Patient was started on Toprol-XL was recommended to undergo echo and stress test.  Results of the echo and stress was reviewed with her in detail and noted below for further reference.  No longer experiencing palpitations.  Patient said the Toprol-XL is significantly helped her symptoms.  And she is also making every effort to reduce the consumption of caffeinated beverages.  She is down to 3 sodas per day with meals.  Patient is also cognizant with regards to complete cessation of smoking-endorses that is difficult but is motivated.  Current Medications: Current Meds  Medication Sig   acetaminophen (TYLENOL) 500 MG tablet Take 1 tablet (500 mg total) by mouth every 6 (six) hours as needed.   amoxicillin-clavulanate  (AUGMENTIN) 875-125 MG tablet Take 1 tablet by mouth 2 (two) times daily.   aspirin 81 MG tablet Take 81 mg by mouth daily.   atorvastatin (LIPITOR) 20 MG tablet Take 1 tablet (20 mg total) by mouth daily.   benzonatate (TESSALON PERLES) 100 MG capsule Take 1 capsule (100 mg total) by mouth 3 (three) times daily as needed for cough.   cetirizine (ZYRTEC) 10 MG tablet Take 1 tablet (10 mg total) by mouth daily.   Cholecalciferol (VITAMIN D) 50 MCG (2000 UT) CAPS Take 1 capsule (2,000 Units total) by mouth daily.   fluticasone (FLONASE) 50 MCG/ACT nasal spray Place 2 sprays into both nostrils daily.   meclizine (ANTIVERT) 25 MG tablet Take 1 tablet (25 mg total) by mouth 3 (three) times daily as needed for dizziness.   metoprolol succinate (TOPROL XL) 25 MG 24 hr tablet Take 1 tablet (25 mg total) by mouth daily.   Multiple Vitamin (MULTIVITAMIN) tablet Take 1 tablet by mouth daily.   Multiple Vitamins-Minerals (PRESERVISION AREDS) CAPS Take 1 each by mouth in the morning and at bedtime.   naproxen (NAPROSYN) 375 MG tablet Take 1 tablet (375 mg total) by mouth 2 (two) times daily.   sertraline (ZOLOFT) 50 MG tablet Take 1 tablet (50 mg total) by mouth daily.   sodium chloride (OCEAN) 0.65 % SOLN nasal spray Place 1 spray into both nostrils as needed for congestion.   Current Facility-Administered Medications for the 08/30/23 encounter (Office Visit) with Tessa Lerner, DO  Medication   lidocaine (XYLOCAINE) 0.5 % (with pres) injection 5 mL  Allergies:    Patient has no known allergies.   Past Medical History: Past Medical History:  Diagnosis Date   Allergy    Anxiety    Bartholin gland cyst 2008   COVID 09/12/2020   Depression    Hyperlipidemia    Tobacco use disorder 09/03/2014    Past Surgical History: Past Surgical History:  Procedure Laterality Date   BREAST BIOPSY Left 2020   CATARACT EXTRACTION Bilateral    CHOLECYSTECTOMY  1999    Social History: Social History    Tobacco Use   Smoking status: Every Day    Current packs/day: 1.00    Average packs/day: 1 pack/day for 40.0 years (40.0 ttl pk-yrs)    Types: Cigarettes   Smokeless tobacco: Never  Vaping Use   Vaping status: Never Used  Substance Use Topics   Alcohol use: Not Currently    Comment: occ   Drug use: No    Family History: Family History  Problem Relation Age of Onset   Diabetes Mother    Heart disease Mother    Heart attack Mother    AAA (abdominal aortic aneurysm) Mother    COPD Mother    Cancer Father        lung   Hypertension Brother    Cancer - Other Brother        pallate cancer   Diabetes Brother    Anesthesia problems Neg Hx    Hypotension Neg Hx    Malignant hyperthermia Neg Hx    Pseudochol deficiency Neg Hx    Colon cancer Neg Hx    Stomach cancer Neg Hx    Rectal cancer Neg Hx    Esophageal cancer Neg Hx    Breast cancer Neg Hx     ROS:   Review of Systems  Cardiovascular:  Negative for chest pain, claudication, dyspnea on exertion, irregular heartbeat, leg swelling, near-syncope, orthopnea, palpitations, paroxysmal nocturnal dyspnea and syncope.  Respiratory:  Positive for snoring. Negative for shortness of breath.   Hematologic/Lymphatic: Negative for bleeding problem.  Musculoskeletal:  Negative for muscle cramps and myalgias.  Neurological:  Negative for dizziness and light-headedness.    EKGs/Labs/Other Studies Reviewed:   Echo January 2025 LVEF: 55 to 60% Diastolic Function: Grade 1 Regurgitation: Mild AR Estimated RAP 3 mmHg  Exercise nuclear stress test January 2025: Low risk study   Cardiac monitor (Zio Patch): June 02, 2023 -June 16, 2023 Dominant rhythm sinus. Heart rate 47-200 bpm. Avg HR 77 bpm. No atrial fibrillation detected during the monitoring period. No high grade AV block, pauses (3 seconds or longer). Total supraventricular ectopic burden <1%. Total ventricular ectopic burden <1%. Asymptomatic ventricular  runs/NSVT. Fastest episode of NSVT occurred on 06/08/2023 at 12:14 PM, 2 seconds duration, 5 beats, max HR 176 bpm, average HR 164 bpm. Patient triggered events: 3. Underlying rhythm sinus with PVCs in a bigeminal pattern   RADIOLOGY: Lumbar Spine Xray 07/2014 Aortoiliac atherosclerotic vascular disease.  See report for additional details  Labs:    Latest Ref Rng & Units 05/16/2023    5:28 PM 06/08/2022   11:42 AM 05/15/2021   10:03 AM  CBC  WBC 4.0 - 10.5 K/uL 8.7  7.7  9.4   Hemoglobin 12.0 - 15.0 g/dL 40.9  81.1  91.4   Hematocrit 36.0 - 46.0 % 43.4  44.3  44.6   Platelets 150 - 400 K/uL 293  331  322        Latest Ref Rng & Units 05/16/2023  5:28 PM 06/08/2022   11:42 AM 05/15/2021   10:03 AM  BMP  Glucose 70 - 99 mg/dL 98  86  94   BUN 6 - 20 mg/dL 7  7  9    Creatinine 0.44 - 1.00 mg/dL 1.61  0.96  0.45   BUN/Creat Ratio 9 - 23  10  13    Sodium 135 - 145 mmol/L 142  142  142   Potassium 3.5 - 5.1 mmol/L 3.8  4.3  4.2   Chloride 98 - 111 mmol/L 105  106  106   CO2 22 - 32 mmol/L 27  25    Calcium 8.9 - 10.3 mg/dL 40.9  9.6  9.7       Latest Ref Rng & Units 05/16/2023    5:28 PM 06/08/2022   11:42 AM 05/15/2021   10:03 AM  CMP  Glucose 70 - 99 mg/dL 98  86  94   BUN 6 - 20 mg/dL 7  7  9    Creatinine 0.44 - 1.00 mg/dL 8.11  9.14  7.82   Sodium 135 - 145 mmol/L 142  142  142   Potassium 3.5 - 5.1 mmol/L 3.8  4.3  4.2   Chloride 98 - 111 mmol/L 105  106  106   CO2 22 - 32 mmol/L 27  25    Calcium 8.9 - 10.3 mg/dL 95.6  9.6  9.7   Total Protein 6.0 - 8.5 g/dL  6.9  7.3   Total Bilirubin 0.0 - 1.2 mg/dL  0.3  0.3   Alkaline Phos 44 - 121 IU/L  97  97   AST 0 - 40 IU/L  25  23   ALT 0 - 32 IU/L  14      Lab Results  Component Value Date   CHOL 119 06/10/2023   HDL 48 06/10/2023   LDLCALC 55 06/10/2023   TRIG 83 06/10/2023   CHOLHDL 2.5 06/10/2023   No results for input(s): "LIPOA" in the last 8760 hours. No components found for: "NTPROBNP" No results  for input(s): "PROBNP" in the last 8760 hours. Recent Labs    05/16/23 1728  TSH 0.803    Physical Exam:    Today's Vitals   08/30/23 0830  BP: 120/78  Pulse: 75  Resp: 16  SpO2: 97%  Weight: 128 lb (58.1 kg)  Height: 5' (1.524 m)   Body mass index is 25 kg/m. Wt Readings from Last 3 Encounters:  08/30/23 128 lb (58.1 kg)  08/02/23 123 lb (55.8 kg)  07/29/23 128 lb (58.1 kg)    Physical Exam  Constitutional: No distress.  hemodynamically stable  Neck: No JVD present.  Cardiovascular: Normal rate, regular rhythm, S1 normal, S2 normal and intact distal pulses. Exam reveals no gallop, no S3 and no S4.  No murmur heard. Pulses:      Dorsalis pedis pulses are 2+ on the right side and 2+ on the left side.       Posterior tibial pulses are 1+ on the right side and 1+ on the left side.  Pulmonary/Chest: Effort normal and breath sounds normal. No stridor. She has no wheezes. She has no rales.  Abdominal: Soft. Bowel sounds are normal. She exhibits no distension. There is no abdominal tenderness.  Musculoskeletal:        General: No edema.     Cervical back: Neck supple.  Neurological: She is alert and oriented to person, place, and time. She has intact cranial nerves (2-12).  Skin: Skin is warm and moist.   Impression & Recommendation(s):  Impression:   ICD-10-CM   1. Palpitations  R00.2     2. NSVT (nonsustained ventricular tachycardia) (HCC)  I47.29     3. Premature ventricular contractions  I49.3     4. Snoring  R06.83     5. Cigarette smoker  F17.210     6. Atherosclerosis of aorta (HCC)  I70.0         Recommendation(s):  Palpitations NSVT (nonsustained ventricular tachycardia) (HCC) Premature ventricular contractions It was felt that her palpitations most likely secondary to excessive consumption of caffeinated beverages.  She is making cognizant efforts with reducing the intake of caffeinated beverages.  Is currently down to 3 drinks per day.  Zio monitor  was performed to evaluate for dysrhythmias.   During the Zio monitoring she had asymptomatic episodes of NSVT, and patient triggered events were associated with PVCs.   Given her risk factors patient was started on Toprol-XL 25 mg p.o. daily and patient states that she feels better since starting Toprol-XL. Once she has reduced caffeine and/or her symptoms of palpitations have resolved it would be reasonable to trial a period of time off of Toprol XL if she feels fine than she can stop Toprol XL long-term. However, if her palpitations returns than continuing Toprol XL 25mg  po qday for symptoms control is reasonable. Patient agreeable with the plan of care.   Given the findings of NSVT and patient triggered events noted PVCs patient did undergo further workup with echo and stress test.  Results reviewed and noted above for further reference.  Snoring Patient endorses snoring. Encouraged her to discuss this further with PCP and consider sleep evaluation sleep apnea.  Cigarette smoker Smoking since age of 69. Tobacco cessation counseling: Currently smoking 1 packs/day   She is informed of the dangers of tobacco abuse including stroke, cancer, and MI, as well as benefits of tobacco cessation. She is willing to quit at this time. 3 mins were spent counseling patient cessation techniques. We discussed various methods to help quit smoking, including deciding on a date to quit, joining a support group, pharmacological agents- nicotine gum/patch/lozenges.  I will reassess her progress at the next follow-up visit  Atherosclerosis of aorta [facility]) Prior to establishing care patient was already on aspirin and statin therapy. Risk factors are currently being monitored by PCP. Educated on importance of complete smoking cessation. Encouraged her to improve her modifiable cardiovascular risk factors.  I spent 35 minutes in the care of Brandi Molina today including reviewing studies (zio monitor, echo,  stress test), face to face time discussing treatment options (palpitations and treatment  as discussed above), and documenting in the encounter.   Orders Placed:  No orders of the defined types were placed in this encounter.   Final Medication List:   No orders of the defined types were placed in this encounter.   Medications Discontinued During This Encounter  Medication Reason   lidocaine (XYLOCAINE) 2 % solution Patient Preference     Current Outpatient Medications:    acetaminophen (TYLENOL) 500 MG tablet, Take 1 tablet (500 mg total) by mouth every 6 (six) hours as needed., Disp: 30 tablet, Rfl: 0   amoxicillin-clavulanate (AUGMENTIN) 875-125 MG tablet, Take 1 tablet by mouth 2 (two) times daily., Disp: 14 tablet, Rfl: 0   aspirin 81 MG tablet, Take 81 mg by mouth daily., Disp: , Rfl:    atorvastatin (LIPITOR) 20 MG tablet, Take 1 tablet (20  mg total) by mouth daily., Disp: 30 tablet, Rfl: 6   benzonatate (TESSALON PERLES) 100 MG capsule, Take 1 capsule (100 mg total) by mouth 3 (three) times daily as needed for cough., Disp: 20 capsule, Rfl: 0   cetirizine (ZYRTEC) 10 MG tablet, Take 1 tablet (10 mg total) by mouth daily., Disp: 30 tablet, Rfl: 11   Cholecalciferol (VITAMIN D) 50 MCG (2000 UT) CAPS, Take 1 capsule (2,000 Units total) by mouth daily., Disp: 30 capsule, Rfl: 6   fluticasone (FLONASE) 50 MCG/ACT nasal spray, Place 2 sprays into both nostrils daily., Disp: 16 g, Rfl: 6   meclizine (ANTIVERT) 25 MG tablet, Take 1 tablet (25 mg total) by mouth 3 (three) times daily as needed for dizziness., Disp: 30 tablet, Rfl: 0   metoprolol succinate (TOPROL XL) 25 MG 24 hr tablet, Take 1 tablet (25 mg total) by mouth daily., Disp: 90 tablet, Rfl: 3   Multiple Vitamin (MULTIVITAMIN) tablet, Take 1 tablet by mouth daily., Disp: , Rfl:    Multiple Vitamins-Minerals (PRESERVISION AREDS) CAPS, Take 1 each by mouth in the morning and at bedtime., Disp: , Rfl:    naproxen (NAPROSYN) 375 MG  tablet, Take 1 tablet (375 mg total) by mouth 2 (two) times daily., Disp: 30 tablet, Rfl: 6   sertraline (ZOLOFT) 50 MG tablet, Take 1 tablet (50 mg total) by mouth daily., Disp: 90 tablet, Rfl: 1   sodium chloride (OCEAN) 0.65 % SOLN nasal spray, Place 1 spray into both nostrils as needed for congestion., Disp: 30 mL, Rfl: 0  Current Facility-Administered Medications:    lidocaine (XYLOCAINE) 0.5 % (with pres) injection 5 mL, 5 mL, Intradermal, Once, Massie Maroon, FNP  Consent:   N/A  Disposition:   As needed.   Her questions and concerns were addressed to her satisfaction. She voices understanding of the recommendations provided during this encounter.    Signed, Tessa Lerner, DO, [facility]  [facility]  [address] [place], [place] [address] 08/30/2023 9:25 AM

## 2023-09-06 ENCOUNTER — Ambulatory Visit (INDEPENDENT_AMBULATORY_CARE_PROVIDER_SITE_OTHER): Payer: Medicaid Other | Admitting: Nurse Practitioner

## 2023-09-06 ENCOUNTER — Other Ambulatory Visit (HOSPITAL_COMMUNITY): Payer: Self-pay

## 2023-09-06 ENCOUNTER — Other Ambulatory Visit: Payer: Self-pay

## 2023-09-06 ENCOUNTER — Encounter: Payer: Self-pay | Admitting: Nurse Practitioner

## 2023-09-06 ENCOUNTER — Other Ambulatory Visit: Payer: Self-pay | Admitting: Nurse Practitioner

## 2023-09-06 VITALS — BP 104/55 | HR 82 | Temp 97.4°F | Wt 127.0 lb

## 2023-09-06 DIAGNOSIS — F17200 Nicotine dependence, unspecified, uncomplicated: Secondary | ICD-10-CM

## 2023-09-06 DIAGNOSIS — K121 Other forms of stomatitis: Secondary | ICD-10-CM | POA: Insufficient documentation

## 2023-09-06 DIAGNOSIS — F411 Generalized anxiety disorder: Secondary | ICD-10-CM

## 2023-09-06 MED ORDER — SERTRALINE HCL 50 MG PO TABS
50.0000 mg | ORAL_TABLET | Freq: Every day | ORAL | 1 refills | Status: DC
  Filled 2023-09-06: qty 90, 90d supply, fill #0
  Filled 2023-12-02: qty 90, 90d supply, fill #1

## 2023-09-06 MED ORDER — DEXAMETHASONE 0.5 MG/5ML PO ELIX
ORAL_SOLUTION | ORAL | 0 refills | Status: AC
  Filled 2023-09-06: qty 100, 30d supply, fill #0
  Filled 2023-09-06: qty 60, 3d supply, fill #0
  Filled 2023-09-06: qty 40, 2d supply, fill #0

## 2023-09-06 NOTE — Assessment & Plan Note (Signed)
1. Mouth ulcer (Primary)  - dexamethasone 0.5 MG/5ML elixir; Take  5 mL by mouth swish and spit three to four times daily.  Dispense: 100 mL; Refill: 0 - Chlamydia/GC NAA, Confirmation - Herpes simplex virus(hsv) dna by pcr - HSV Culture, No Typing Educational material on treatment of mouth ulcer provided  Dexamethasone elixir 0.5 mg/5 mL - 5 mL swish and spit three to four times daily. It is important to keep the medication in the mouth for five minutes prior to spitting it out. Do not rinse afterward and avoid eating or drinking for 30 minutes.

## 2023-09-06 NOTE — Assessment & Plan Note (Signed)
Smokes about 1 pack/day  Asked about quitting: confirms that he/she currently smokes cigarettes Advise to quit smoking: Educated about QUITTING to reduce the risk of cancer, cardio and cerebrovascular disease. Assess willingness: Unwilling to quit at this time, but is working on cutting back. Assist with counseling and pharmacotherapy: Counseled for 5 minutes and literature provided. Arrange for follow up: follow up in 10 months and continue to offer help.

## 2023-09-06 NOTE — Progress Notes (Signed)
Acute Office Visit  Subjective:     Patient ID: Brandi Molina, female    DOB: [birth year], 58 y.o.   MRN: [id number]  Chief Complaint  Patient presents with   Oral Pain    2 sore and throat discomfort.  Going on for over a week    Oral Pain  Pertinent negatives include no fever.   Brandi Molina  has a past medical history of Allergy, Anxiety, Bartholin gland cyst (2008), COVID (09/12/2020), Depression, Hyperlipidemia, and Tobacco use disorder (09/03/2014).  Patient presents with complaints of 2 ulcers in her mouth, throat and tongue discomfort that she noticed about a week ago. States that the ulcer is burning and painful.  Stated that she has had this ulcer in the past, the ulcer resolved after using Magic mouthwash.  Stated that she got some Magic mouthwash from the dentist but it has not helped this time.  No fever, chills, malaise.  Smokes 1 pack of cigarettes daily. She practices oral sex and would like to be tested for STD.    Review of Systems  Constitutional:  Negative for appetite change, chills, fatigue and fever.  HENT:  Positive for mouth sores. Negative for congestion, postnasal drip, rhinorrhea and sneezing.   Respiratory:  Negative for cough, shortness of breath and wheezing.   Cardiovascular:  Negative for chest pain, palpitations and leg swelling.  Gastrointestinal:  Negative for abdominal pain, nausea and vomiting.  Genitourinary:  Negative for difficulty urinating, dysuria, flank pain and frequency.  Musculoskeletal:  Negative for arthralgias, back pain, joint swelling and myalgias.  Skin:  Negative for color change, pallor, rash and wound.  Neurological:  Negative for dizziness, facial asymmetry, weakness, numbness and headaches.  Psychiatric/Behavioral:  Negative for behavioral problems, confusion, self-injury and suicidal ideas.         Objective:    BP (!) 104/55   Pulse 82   Temp (!) 97.4 F (36.3 C)   Wt 127 lb (57.6 kg)   LMP 09/20/2013 (LMP  Unknown)   SpO2 99%   BMI 24.80 kg/m    Physical Exam Vitals and nursing note reviewed.  Constitutional:      General: She is not in acute distress.    Appearance: Normal appearance. She is not ill-appearing, toxic-appearing or diaphoretic.  HENT:     Right Ear: Tympanic membrane, ear canal and external ear normal. There is no impacted cerumen.     Left Ear: Tympanic membrane, ear canal and external ear normal. There is no impacted cerumen.     Mouth/Throat:     Mouth: Mucous membranes are moist.     Dentition: No dental tenderness or gingival swelling.     Tongue: No lesions. Tongue does not deviate from midline.     Pharynx: Oropharynx is clear. No oropharyngeal exudate or posterior oropharyngeal erythema.     Tonsils: No tonsillar exudate or tonsillar abscesses.     Comments: 2 small white lesion noted in the oral cavity.  No redness, swelling, drainage noted Eyes:     General: No scleral icterus.       Right eye: No discharge.        Left eye: No discharge.     Extraocular Movements: Extraocular movements intact.     Conjunctiva/sclera: Conjunctivae normal.  Cardiovascular:     Rate and Rhythm: Normal rate and regular rhythm.     Pulses: Normal pulses.     Heart sounds: Normal heart sounds. No murmur heard.    No friction  rub. No gallop.  Pulmonary:     Effort: Pulmonary effort is normal. No respiratory distress.     Breath sounds: Normal breath sounds. No stridor. No wheezing, rhonchi or rales.  Chest:     Chest wall: No tenderness.  Abdominal:     General: There is no distension.     Palpations: Abdomen is soft.     Tenderness: There is no abdominal tenderness. There is no right CVA tenderness, left CVA tenderness or guarding.  Musculoskeletal:        General: No swelling, tenderness, deformity or signs of injury.     Right lower leg: No edema.     Left lower leg: No edema.  Skin:    General: Skin is warm and dry.     Capillary Refill: Capillary refill takes less  than 2 seconds.     Coloration: Skin is not jaundiced or pale.     Findings: No bruising, erythema or lesion.  Neurological:     Mental Status: She is alert and oriented to person, place, and time.     Motor: No weakness.     Coordination: Coordination normal.     Gait: Gait normal.  Psychiatric:        Mood and Affect: Mood normal.        Behavior: Behavior normal.        Thought Content: Thought content normal.        Judgment: Judgment normal.     No results found for any visits on 09/06/23.      Assessment & Plan:   Problem List Items Addressed This Visit       Digestive   Mouth ulcer - Primary   1. Mouth ulcer (Primary)  - dexamethasone 0.5 MG/5ML elixir; Take  5 mL by mouth swish and spit three to four times daily.  Dispense: 100 mL; Refill: 0 - Chlamydia/GC NAA, Confirmation - Herpes simplex virus(hsv) dna by pcr - HSV Culture, No Typing Educational material on treatment of mouth ulcer provided  Dexamethasone elixir 0.5 mg/5 mL - 5 mL swish and spit three to four times daily. It is important to keep the medication in the mouth for five minutes prior to spitting it out. Do not rinse afterward and avoid eating or drinking for 30 minutes.       Relevant Medications   dexamethasone 0.5 MG/5ML elixir   Other Relevant Orders   Chlamydia/GC NAA, Confirmation   Herpes simplex virus(hsv) dna by pcr   HSV Culture, No Typing     Other   Tobacco use disorder   Smokes about 1 pack/day  Asked about quitting: confirms that he/she currently smokes cigarettes Advise to quit smoking: Educated about QUITTING to reduce the risk of cancer, cardio and cerebrovascular disease. Assess willingness: Unwilling to quit at this time, but is working on cutting back. Assist with counseling and pharmacotherapy: Counseled for 5 minutes and literature provided. Arrange for follow up: follow up in 10 months and continue to offer help.        Meds ordered this encounter  Medications    dexamethasone 0.5 MG/5ML elixir    Sig: Take  5 mL by mouth swish and spit three to four times daily.    Dispense:  100 mL    Refill:  0    No follow-ups on file.  Donell Beers, FNP

## 2023-09-06 NOTE — Patient Instructions (Signed)
Dexamethasone elixir 0.5 mg/5 mL - 5 mL swish and spit three to four times daily. It is important to keep the medication in the mouth for five minutes prior to spitting it out. Do not rinse afterward and avoid eating or drinking for 30 minutes.    It is important that you exercise regularly at least 30 minutes 5 times a week as tolerated  Think about what you will eat, plan ahead. Choose " clean, green, fresh or frozen" over canned, processed or packaged foods which are more sugary, salty and fatty. 70 to 75% of food eaten should be vegetables and fruit. Three meals at set times with snacks allowed between meals, but they must be fruit or vegetables. Aim to eat over a 12 hour period , example 7 am to 7 pm, and STOP after  your last meal of the day. Drink water,generally about 64 ounces per day, no other drink is as healthy. Fruit juice is best enjoyed in a healthy way, by EATING the fruit.  Thanks for choosing [facility] we consider it a privelige to serve you.

## 2023-09-06 NOTE — Telephone Encounter (Signed)
 Please advise [facility]

## 2023-09-08 LAB — HSV CULTURE, NO TYPING: HSV Culture Without Typing: NEGATIVE

## 2023-09-09 ENCOUNTER — Other Ambulatory Visit: Payer: Self-pay

## 2023-09-09 ENCOUNTER — Other Ambulatory Visit (HOSPITAL_COMMUNITY): Payer: Self-pay

## 2023-09-11 ENCOUNTER — Other Ambulatory Visit (HOSPITAL_COMMUNITY): Payer: Self-pay

## 2023-10-08 ENCOUNTER — Other Ambulatory Visit: Payer: Self-pay

## 2023-11-06 ENCOUNTER — Other Ambulatory Visit: Payer: Self-pay

## 2023-12-03 ENCOUNTER — Other Ambulatory Visit: Payer: Self-pay

## 2024-01-02 ENCOUNTER — Other Ambulatory Visit: Payer: Self-pay | Admitting: Nurse Practitioner

## 2024-01-02 DIAGNOSIS — F411 Generalized anxiety disorder: Secondary | ICD-10-CM

## 2024-01-03 ENCOUNTER — Other Ambulatory Visit: Payer: Self-pay

## 2024-01-03 NOTE — Telephone Encounter (Signed)
 Please advise [facility]

## 2024-01-06 ENCOUNTER — Other Ambulatory Visit: Payer: Self-pay

## 2024-01-06 MED ORDER — SERTRALINE HCL 50 MG PO TABS
50.0000 mg | ORAL_TABLET | Freq: Every day | ORAL | 1 refills | Status: AC
  Filled 2024-01-06 – 2024-03-02 (×3): qty 90, 90d supply, fill #0
  Filled 2024-04-02 – 2024-05-29 (×2): qty 90, 90d supply, fill #1

## 2024-02-04 ENCOUNTER — Other Ambulatory Visit: Payer: Self-pay

## 2024-03-02 ENCOUNTER — Other Ambulatory Visit: Payer: Self-pay

## 2024-03-02 ENCOUNTER — Other Ambulatory Visit: Payer: Self-pay | Admitting: Nurse Practitioner

## 2024-03-02 DIAGNOSIS — I7409 Other arterial embolism and thrombosis of abdominal aorta: Secondary | ICD-10-CM

## 2024-03-02 MED ORDER — ATORVASTATIN CALCIUM 20 MG PO TABS
20.0000 mg | ORAL_TABLET | Freq: Every day | ORAL | 6 refills | Status: AC
  Filled 2024-03-02: qty 30, 30d supply, fill #0
  Filled 2024-04-02: qty 30, 30d supply, fill #1
  Filled 2024-04-30: qty 30, 30d supply, fill #2
  Filled 2024-05-29: qty 30, 30d supply, fill #3
  Filled 2024-07-04: qty 30, 30d supply, fill #4
  Filled 2024-07-30: qty 30, 30d supply, fill #5
  Filled 2024-08-29: qty 30, 30d supply, fill #6

## 2024-04-02 ENCOUNTER — Other Ambulatory Visit: Payer: Self-pay

## 2024-05-04 ENCOUNTER — Other Ambulatory Visit: Payer: Self-pay

## 2024-06-01 ENCOUNTER — Other Ambulatory Visit: Payer: Self-pay

## 2024-06-05 DIAGNOSIS — H353132 Nonexudative age-related macular degeneration, bilateral, intermediate dry stage: Secondary | ICD-10-CM | POA: Diagnosis not present

## 2024-06-05 DIAGNOSIS — H26491 Other secondary cataract, right eye: Secondary | ICD-10-CM | POA: Diagnosis not present

## 2024-06-05 DIAGNOSIS — Z961 Presence of intraocular lens: Secondary | ICD-10-CM | POA: Diagnosis not present

## 2024-06-10 ENCOUNTER — Encounter: Payer: Self-pay | Admitting: Nurse Practitioner

## 2024-06-10 ENCOUNTER — Ambulatory Visit (INDEPENDENT_AMBULATORY_CARE_PROVIDER_SITE_OTHER): Payer: Self-pay | Admitting: Nurse Practitioner

## 2024-06-10 VITALS — BP 123/65 | HR 72 | Ht 60.0 in | Wt 127.0 lb

## 2024-06-10 DIAGNOSIS — H938X1 Other specified disorders of right ear: Secondary | ICD-10-CM

## 2024-06-10 DIAGNOSIS — R42 Dizziness and giddiness: Secondary | ICD-10-CM | POA: Diagnosis not present

## 2024-06-10 DIAGNOSIS — Z13220 Encounter for screening for lipoid disorders: Secondary | ICD-10-CM | POA: Diagnosis not present

## 2024-06-10 DIAGNOSIS — Z1329 Encounter for screening for other suspected endocrine disorder: Secondary | ICD-10-CM | POA: Diagnosis not present

## 2024-06-10 DIAGNOSIS — Z0000 Encounter for general adult medical examination without abnormal findings: Secondary | ICD-10-CM | POA: Diagnosis not present

## 2024-06-10 NOTE — Patient Instructions (Signed)

## 2024-06-10 NOTE — Progress Notes (Signed)
 Subjective   Patient ID: Brandi Molina, female    DOB: [birth year], 58 y.o.   MRN: [id number]  Chief Complaint  Patient presents with   Annual Exam   Dizziness    Possible vertigo, woke up really dizzy   Ear Fullness    Right ear    Referring provider: Oley Bascom RAMAN, NP  Brandi Molina is a 58 y.o. female with Past Medical History: No date: Allergy No date: Anxiety 2008: Bartholin gland cyst 09/12/2020: COVID No date: Depression No date: Hyperlipidemia 09/03/2014: Tobacco use disorder   HPI  Patient presents today for annual exam.  Overall she has been doing well other than periodic vertigo.  She does have meclizine  and states that this is helping relieve the vertigo.  She does have some right ear fullness.  Upon exam TM appears normal.  Will place referral to ENT for dizziness and ear fullness. Denies f/c/s, n/v/d, hemoptysis, PND, leg swelling Denies chest pain or edema     No Known Allergies  Immunization History  Administered Date(s) Administered   Influenza,inj,Quad PF,6+ Mos 05/01/2018   Influenza-Unspecified 05/14/2017   Tdap 06/02/2018    Tobacco History: Social History   Tobacco Use  Smoking Status Every Day   Current packs/day: 1.00   Average packs/day: 1 pack/day for 40.0 years (40.0 ttl pk-yrs)   Types: Cigarettes  Smokeless Tobacco Never   Ready to quit: Not Answered Counseling given: Not Answered   Outpatient Encounter Medications as of 06/10/2024  Medication Sig   acetaminophen  (TYLENOL ) 500 MG tablet Take 1 tablet (500 mg total) by mouth every 6 (six) hours as needed.   aspirin 81 MG tablet Take 81 mg by mouth daily.   atorvastatin  (LIPITOR) 20 MG tablet Take 1 tablet (20 mg total) by mouth daily.   cetirizine  (ZYRTEC ) 10 MG tablet Take 1 tablet (10 mg total) by mouth daily.   Cholecalciferol (VITAMIN D ) 50 MCG (2000 UT) CAPS Take 1 capsule (2,000 Units total) by mouth daily.   dexamethasone  0.5 MG/5ML elixir Swish and  spit 5 mL by mouth three to four times daily.   fluticasone  (FLONASE ) 50 MCG/ACT nasal spray Place 2 sprays into both nostrils daily.   meclizine  (ANTIVERT ) 25 MG tablet Take 1 tablet (25 mg total) by mouth 3 (three) times daily as needed for dizziness.   metoprolol  succinate (TOPROL  XL) 25 MG 24 hr tablet Take 1 tablet (25 mg total) by mouth daily.   Multiple Vitamin (MULTIVITAMIN) tablet Take 1 tablet by mouth daily.   Multiple Vitamins-Minerals (PRESERVISION AREDS) CAPS Take 1 each by mouth in the morning and at bedtime.   naproxen  (NAPROSYN ) 375 MG tablet Take 1 tablet (375 mg total) by mouth 2 (two) times daily.   sertraline  (ZOLOFT ) 50 MG tablet Take 1 tablet (50 mg total) by mouth daily.   benzonatate  (TESSALON  PERLES) 100 MG capsule Take 1 capsule (100 mg total) by mouth 3 (three) times daily as needed for cough. (Patient not taking: Reported on 06/10/2024)   sodium chloride  (OCEAN) 0.65 % SOLN nasal spray Place 1 spray into both nostrils as needed for congestion. (Patient not taking: Reported on 06/10/2024)   Facility-Administered Encounter Medications as of 06/10/2024  Medication   lidocaine  (XYLOCAINE ) 0.5 % (with pres) injection 5 mL    Review of Systems  Review of Systems  HENT:  Positive for ear pain.      Objective:   BP 123/65 (BP Location: Left Arm, Patient Position: Sitting, Cuff Size: Large)  Pulse 72   Ht 5' (1.524 m)   Wt 127 lb (57.6 kg)   LMP 09/20/2013 (LMP Unknown)   SpO2 98%   BMI 24.80 kg/m   Wt Readings from Last 5 Encounters:  06/10/24 127 lb (57.6 kg)  09/06/23 127 lb (57.6 kg)  08/30/23 128 lb (58.1 kg)  08/02/23 123 lb (55.8 kg)  07/29/23 128 lb (58.1 kg)     Physical Exam Vitals and nursing note reviewed.  Constitutional:      General: She is not in acute distress.    Appearance: She is well-developed.  Cardiovascular:     Rate and Rhythm: Regular rhythm.  Pulmonary:     Effort: Pulmonary effort is normal.     Breath sounds: Normal  breath sounds.  Neurological:     Mental Status: She is alert and oriented to person, place, and time.       Assessment & Plan:   Vertigo -     Ambulatory referral to ENT  Ear fullness, right -     Ambulatory referral to ENT  Lipid screening -     Lipid panel  Routine adult health maintenance -     CBC -     Comprehensive metabolic panel with GFR  Thyroid  disorder screen -     TSH     Return in about 1 year (around 06/10/2025) for Physical.     Bascom GORMAN Borer, NP 06/10/2024

## 2024-06-11 ENCOUNTER — Ambulatory Visit: Payer: Self-pay | Admitting: Nurse Practitioner

## 2024-06-11 LAB — COMPREHENSIVE METABOLIC PANEL WITH GFR
ALT: 17 IU/L (ref 0–32)
AST: 26 IU/L (ref 0–40)
Albumin: 4.4 g/dL (ref 3.8–4.9)
Alkaline Phosphatase: 102 IU/L (ref 49–135)
BUN/Creatinine Ratio: 10 (ref 9–23)
BUN: 8 mg/dL (ref 6–24)
Bilirubin Total: 0.4 mg/dL (ref 0.0–1.2)
CO2: 20 mmol/L (ref 20–29)
Calcium: 9.6 mg/dL (ref 8.7–10.2)
Chloride: 104 mmol/L (ref 96–106)
Creatinine, Ser: 0.78 mg/dL (ref 0.57–1.00)
Globulin, Total: 2.5 g/dL (ref 1.5–4.5)
Glucose: 93 mg/dL (ref 70–99)
Potassium: 4.6 mmol/L (ref 3.5–5.2)
Sodium: 141 mmol/L (ref 134–144)
Total Protein: 6.9 g/dL (ref 6.0–8.5)
eGFR: 89 mL/min/1.73 (ref >59)

## 2024-06-11 LAB — CBC
Hematocrit: 46.6 % (ref 34.0–46.6)
Hemoglobin: 15.5 g/dL (ref 11.1–15.9)
MCH: 32.2 pg (ref 26.6–33.0)
MCHC: 33.3 g/dL (ref 31.5–35.7)
MCV: 97 fL (ref 79–97)
Platelets: 318 x10E3/uL (ref 150–450)
RBC: 4.82 x10E6/uL (ref 3.77–5.28)
RDW: 11.9 % (ref 11.7–15.4)
WBC: 7.9 x10E3/uL (ref 3.4–10.8)

## 2024-06-11 LAB — LIPID PANEL
Chol/HDL Ratio: 2.8 ratio (ref 0.0–4.4)
Cholesterol, Total: 130 mg/dL (ref 100–199)
HDL: 47 mg/dL (ref >39)
LDL Chol Calc (NIH): 62 mg/dL (ref 0–99)
Triglycerides: 113 mg/dL (ref 0–149)
VLDL Cholesterol Cal: 21 mg/dL (ref 5–40)

## 2024-06-11 LAB — TSH: TSH: 0.706 u[IU]/mL (ref 0.450–4.500)

## 2024-07-04 ENCOUNTER — Other Ambulatory Visit: Payer: Self-pay | Admitting: Nurse Practitioner

## 2024-07-04 DIAGNOSIS — J301 Allergic rhinitis due to pollen: Secondary | ICD-10-CM

## 2024-07-05 ENCOUNTER — Other Ambulatory Visit: Payer: Self-pay

## 2024-07-06 ENCOUNTER — Other Ambulatory Visit: Payer: Self-pay

## 2024-07-06 MED ORDER — CETIRIZINE HCL 10 MG PO TABS
10.0000 mg | ORAL_TABLET | Freq: Every day | ORAL | 1 refills | Status: AC
  Filled 2024-07-06: qty 30, 30d supply, fill #0
  Filled 2024-07-30: qty 30, 30d supply, fill #1
  Filled 2024-08-29: qty 30, 30d supply, fill #2

## 2024-07-09 ENCOUNTER — Encounter (INDEPENDENT_AMBULATORY_CARE_PROVIDER_SITE_OTHER): Payer: Self-pay

## 2024-08-03 ENCOUNTER — Other Ambulatory Visit: Payer: Self-pay

## 2024-08-05 ENCOUNTER — Encounter (INDEPENDENT_AMBULATORY_CARE_PROVIDER_SITE_OTHER): Payer: Self-pay

## 2024-08-05 ENCOUNTER — Other Ambulatory Visit: Payer: Self-pay

## 2024-08-05 ENCOUNTER — Ambulatory Visit (INDEPENDENT_AMBULATORY_CARE_PROVIDER_SITE_OTHER)

## 2024-08-05 VITALS — BP 132/74 | HR 83 | Temp 98.3°F

## 2024-08-05 DIAGNOSIS — H938X1 Other specified disorders of right ear: Secondary | ICD-10-CM | POA: Diagnosis not present

## 2024-08-05 DIAGNOSIS — R42 Dizziness and giddiness: Secondary | ICD-10-CM

## 2024-08-05 MED ORDER — PROMETHAZINE HCL 12.5 MG PO TABS
12.5000 mg | ORAL_TABLET | Freq: Three times a day (TID) | ORAL | 0 refills | Status: AC | PRN
  Filled 2024-08-05: qty 20, 7d supply, fill #0

## 2024-08-05 NOTE — Progress Notes (Signed)
 Dear Dr. Oley, Here is my assessment for our mutual patient, Brandi Molina. Thank you for allowing me the opportunity to care for your patient. Please do not hesitate to contact me should you have any other questions. Sincerely, Dr. Hadassah Parody  Otolaryngology Clinic Note Referring provider: Dr. Oley HPI:   Initial HPI (08/05/2024) 58 year old female here for evaluation of episodic vertigo.  She has had intermittent vertigo episodes of the last 5 years most recently and last week.  Episodes last anywhere from several minutes to hours.  Vertigo is exacerbated by lying on her left side resulting in a spinning sensation, improves when she is lying on the right side.  She can get vertigo at other times as well when she is bending over.  Sometimes vertigo can come on without any type of preceding trigger.   When episodes occur she sometimes has whooshing sounds in her ears.  Sometimes nausea.  Does not notice any hearing loss.  She does have long-term right ear fullness.  No history of headaches or migraine  Takes meclizine  during episodes and this helps sometimes.    She had vertigo worked up previously and saw vestibular rehab but at that time she was not having vertigo and so maneuvers were not helpful.  Independent Review of Additional Tests or Records:  Referral note 06/10/24 Bascom Oley, NP: seen for annual exam and having vertigo helped with meclizine . Right ear fullness. Referral to ENT given fullness and  vertigo    PMH/Meds/All/SocHx/FamHx/ROS:   Past Medical History:  Diagnosis Date   Allergy    Anxiety    Bartholin gland cyst 2008   COVID 09/12/2020   Depression    Hyperlipidemia    Tobacco use disorder 09/03/2014     Past Surgical History:  Procedure Laterality Date   BREAST BIOPSY Left 2020   CATARACT EXTRACTION Bilateral    CHOLECYSTECTOMY  1999    Family History  Problem Relation Age of Onset   Diabetes Mother    Heart disease Mother    Heart  attack Mother    AAA (abdominal aortic aneurysm) Mother    COPD Mother    Cancer Father        lung   Hypertension Brother    Cancer - Other Brother        pallate cancer   Diabetes Brother    Anesthesia problems Neg Hx    Hypotension Neg Hx    Malignant hyperthermia Neg Hx    Pseudochol deficiency Neg Hx    Colon cancer Neg Hx    Stomach cancer Neg Hx    Rectal cancer Neg Hx    Esophageal cancer Neg Hx    Breast cancer Neg Hx      Social Connections: Not on file     Current Outpatient Medications  Medication Instructions   acetaminophen  (TYLENOL ) 500 mg, Oral, Every 6 hours PRN   aspirin 81 mg, Daily   atorvastatin  (LIPITOR) 20 mg, Oral, Daily   cetirizine  (ZYRTEC ) 10 mg, Oral, Daily   dexamethasone  0.5 MG/5ML elixir Swish and spit 5 mL by mouth three to four times daily.   fluticasone  (FLONASE ) 50 MCG/ACT nasal spray 2 sprays, Each Nare, Daily   meclizine  (ANTIVERT ) 25 mg, Oral, 3 times daily PRN   metoprolol  succinate (TOPROL  XL) 25 mg, Oral, Daily   Multiple Vitamin (MULTIVITAMIN) tablet 1 tablet, Daily   Multiple Vitamins-Minerals (PRESERVISION AREDS) CAPS 1 each, 2 times daily   naproxen  (NAPROSYN ) 375 mg, Oral, 2 times daily  promethazine  (PHENERGAN ) 12.5 mg, Oral, Every 8 hours PRN   sertraline  (ZOLOFT ) 50 mg, Oral, Daily   sodium chloride  (OCEAN) 0.65 % SOLN nasal spray 1 spray, Each Nare, As needed   Vitamin D  2,000 Units, Oral, Daily     Physical Exam:   BP 132/74 (BP Location: Right Arm, Patient Position: Sitting)   Pulse 83   Temp 98.3 F (36.8 C)   LMP 09/20/2013   SpO2 96%   Salient findings:  CN II-XII intact  Given history and complaints, ear microscopy was indicated and performed for evaluation with findings as below in physical exam section and in procedures  Bilateral EAC clear and TM intact with well pneumatized middle ear spaces  No obviously palpable neck masses/lymphadenopathy/thyromegaly  No respiratory distress or  stridor  Seprately Identifiable Procedures:  Prior to initiating any procedures, risks/benefits/alternatives were explained to the patient and verbal consent obtained.  Procedure (08/05/2024): Bilateral ear microscopy using microscope (CPT [id number]) Pre-procedure diagnosis: right ear fullness  Post-procedure diagnosis: same Indication: see above; given patient's otologic complaints and history, for improved and comprehensive examination of external ear and tympanic membrane, bilateral otologic examination using microscope was performed. Prior to proceeding, verbal consent was obtained after discussion of R/B/A  Procedure: Patient was placed semi-recumbent. Both ear canals were examined using the microscope with findings above. Patient tolerated the procedure well.   Impression & Plans:  Katiana Viner is a 58 y.o. female with   1. Vertigo    Assessment and Plan Assessment & Plan Recurrent vertigo (possible benign paroxysmal positional vertigo vs. Meniere's disease) She has a five-year history of intermittent vertigo, sometimes positional, associated with right aural fullness and occasional tinnitus. Differential includes benign paroxysmal positional vertigo (BPPV) due to positional triggers and possible Meniere's disease given episodic vertigo, aural fullness, and intermittent auditory symptoms. Episodes are moderate in severity, causing significant nuisance but not daily disability. Examination revealed no evidence of acute or dangerous pathology. Management rationale includes vestibular rehabilitation for BPPV and dietary sodium restriction for possible Meniere's disease.  We discussed trialing a diuretic for Mnire's.  This was deferred per her preference. Risks and side effects of proposed treatments, including medications and dietary changes, were discussed. - Referred to vestibular rehabilitation for assessment and possible canalith repositioning maneuvers for BPPV. - Recommended dietary  sodium restriction to <2 grams/day as a trial for possible Meniere's disease. - Prescribed Phenergan  as needed for nausea during vertigo episodes, in addition to meclizine . - Deferred spironolactone per her preference. - Advised her to keep a log of vertigo episodes, including frequency, duration, and associated symptoms such as hearing changes or tinnitus.  Right aural fullness No concerning findings on exam today Given recurrent vertigo and intermittent auditory symptoms, audiologic evaluation is indicated to assess for underlying hearing loss  - Referred for audiologic hearing evaluation to be scheduled in the coming months. - Advised that hearing evaluation may be canceled if vestibular rehabilitation resolves symptoms.    See below regarding exact medications prescribed this encounter including dosages and route: Meds ordered this encounter  Medications   promethazine  (PHENERGAN ) 12.5 MG tablet    Sig: Take 1 tablet (12.5 mg total) by mouth every 8 (eight) hours as needed for nausea or vomiting.    Dispense:  20 tablet    Refill:  0      Thank you for allowing me the opportunity to care for your patient. Please do not hesitate to contact me should you have any other questions.  Sincerely, Hadassah  Griselda Bramblett, MD Otolaryngologist (ENT), [facility] Phone: [phone] Fax: [phone]  MDM:  Level 4 Complexity/Problems addressed: 4-multiple chronic problems, 1 worsening Data complexity: 3-  independent review of referral note, ordered audiogram - Morbidity: 4-prescription drug manage - Prescription Drug prescribed or managed: Yes

## 2024-08-05 NOTE — Patient Instructions (Signed)
 Recommend limiting salt intake to under 2000 mg a day

## 2024-08-10 ENCOUNTER — Other Ambulatory Visit: Payer: Self-pay

## 2024-09-02 ENCOUNTER — Other Ambulatory Visit: Payer: Self-pay

## 2024-10-07 ENCOUNTER — Ambulatory Visit (INDEPENDENT_AMBULATORY_CARE_PROVIDER_SITE_OTHER): Admitting: Audiology

## 2024-10-07 ENCOUNTER — Ambulatory Visit (INDEPENDENT_AMBULATORY_CARE_PROVIDER_SITE_OTHER)

## 2025-06-11 ENCOUNTER — Encounter: Payer: Self-pay | Admitting: Nurse Practitioner
